# Patient Record
Sex: Female | Born: 1965
Health system: Southern US, Community
[De-identification: ages and names within clinical notes are randomized; demographics above are authoritative.]

## PROBLEM LIST (undated history)

## (undated) DIAGNOSIS — O34219 Maternal care for unspecified type scar from previous cesarean delivery: Secondary | ICD-10-CM

## (undated) DIAGNOSIS — K589 Irritable bowel syndrome without diarrhea: Secondary | ICD-10-CM

## (undated) DIAGNOSIS — K5900 Constipation, unspecified: Secondary | ICD-10-CM

## (undated) DIAGNOSIS — M722 Plantar fascial fibromatosis: Secondary | ICD-10-CM

## (undated) DIAGNOSIS — N841 Polyp of cervix uteri: Secondary | ICD-10-CM

## (undated) DIAGNOSIS — J309 Allergic rhinitis, unspecified: Secondary | ICD-10-CM

## (undated) DIAGNOSIS — Z9889 Other specified postprocedural states: Secondary | ICD-10-CM

## (undated) DIAGNOSIS — R112 Nausea with vomiting, unspecified: Secondary | ICD-10-CM

## (undated) DIAGNOSIS — F39 Unspecified mood [affective] disorder: Secondary | ICD-10-CM

## (undated) DIAGNOSIS — R1084 Generalized abdominal pain: Secondary | ICD-10-CM

## (undated) DIAGNOSIS — J45909 Unspecified asthma, uncomplicated: Secondary | ICD-10-CM

## (undated) DIAGNOSIS — J189 Pneumonia, unspecified organism: Secondary | ICD-10-CM

## (undated) DIAGNOSIS — E669 Obesity, unspecified: Secondary | ICD-10-CM

## (undated) DIAGNOSIS — E279 Disorder of adrenal gland, unspecified: Secondary | ICD-10-CM

## (undated) DIAGNOSIS — K219 Gastro-esophageal reflux disease without esophagitis: Secondary | ICD-10-CM

## (undated) DIAGNOSIS — N289 Disorder of kidney and ureter, unspecified: Secondary | ICD-10-CM

## (undated) DIAGNOSIS — E278 Other specified disorders of adrenal gland: Secondary | ICD-10-CM

## (undated) DIAGNOSIS — Z9289 Personal history of other medical treatment: Secondary | ICD-10-CM

## (undated) HISTORY — DX: Disorder of kidney and ureter, unspecified: N28.9

## (undated) HISTORY — PX: ESOPHAGOGASTRODUODENOSCOPY: SHX1529

## (undated) HISTORY — DX: Generalized abdominal pain: R10.84

## (undated) HISTORY — DX: Obesity, unspecified: E66.9

## (undated) HISTORY — DX: Plantar fascial fibromatosis: M72.2

## (undated) HISTORY — PX: DIAGNOSTIC LAPAROSCOPY: SUR761

## (undated) HISTORY — PX: OTHER SURGICAL HISTORY: SHX169

## (undated) HISTORY — DX: Personal history of other medical treatment: Z92.89

## (undated) HISTORY — DX: Disorder of adrenal gland, unspecified: E27.9

## (undated) HISTORY — DX: Unspecified asthma, uncomplicated: J45.909

## (undated) HISTORY — DX: Other specified disorders of adrenal gland: E27.8

## (undated) HISTORY — DX: Polyp of cervix uteri: N84.1

## (undated) HISTORY — DX: Maternal care for unspecified type scar from previous cesarean delivery: O34.219

## (undated) HISTORY — PX: ABDOMINAL HYSTERECTOMY: SHX81

## (undated) HISTORY — DX: Irritable bowel syndrome, unspecified: K58.9

## (undated) HISTORY — DX: Unspecified mood (affective) disorder: F39

## (undated) HISTORY — DX: Allergic rhinitis, unspecified: J30.9

## (undated) HISTORY — DX: Gastro-esophageal reflux disease without esophagitis: K21.9

## (undated) HISTORY — DX: Constipation, unspecified: K59.00

---

## 1991-02-10 DIAGNOSIS — O34219 Maternal care for unspecified type scar from previous cesarean delivery: Secondary | ICD-10-CM

## 1991-02-10 HISTORY — DX: Maternal care for unspecified type scar from previous cesarean delivery: O34.219

## 2003-02-10 HISTORY — PX: ANTERIOR CERVICAL DECOMP/DISCECTOMY FUSION: SHX1161

## 2003-12-24 ENCOUNTER — Ambulatory Visit (HOSPITAL_COMMUNITY): Admission: RE | Admit: 2003-12-24 | Discharge: 2003-12-24 | Payer: Self-pay | Admitting: Neurosurgery

## 2004-02-13 ENCOUNTER — Encounter: Admission: RE | Admit: 2004-02-13 | Discharge: 2004-02-13 | Payer: Self-pay | Admitting: Neurosurgery

## 2006-04-28 ENCOUNTER — Ambulatory Visit: Payer: Self-pay | Admitting: Unknown Physician Specialty

## 2007-05-10 ENCOUNTER — Ambulatory Visit: Payer: Self-pay | Admitting: Unknown Physician Specialty

## 2007-06-09 DIAGNOSIS — K589 Irritable bowel syndrome without diarrhea: Secondary | ICD-10-CM | POA: Insufficient documentation

## 2007-06-09 DIAGNOSIS — D509 Iron deficiency anemia, unspecified: Secondary | ICD-10-CM | POA: Insufficient documentation

## 2007-06-09 DIAGNOSIS — K219 Gastro-esophageal reflux disease without esophagitis: Secondary | ICD-10-CM | POA: Insufficient documentation

## 2007-06-24 ENCOUNTER — Ambulatory Visit: Payer: Self-pay | Admitting: Unknown Physician Specialty

## 2007-08-03 ENCOUNTER — Ambulatory Visit: Payer: Self-pay | Admitting: Family Medicine

## 2007-11-21 DIAGNOSIS — D131 Benign neoplasm of stomach: Secondary | ICD-10-CM | POA: Insufficient documentation

## 2007-11-21 DIAGNOSIS — Z86010 Personal history of colonic polyps: Secondary | ICD-10-CM | POA: Insufficient documentation

## 2008-04-10 ENCOUNTER — Ambulatory Visit: Payer: Self-pay | Admitting: Family Medicine

## 2008-05-23 ENCOUNTER — Ambulatory Visit: Payer: Self-pay | Admitting: Family Medicine

## 2008-09-17 DIAGNOSIS — E559 Vitamin D deficiency, unspecified: Secondary | ICD-10-CM | POA: Insufficient documentation

## 2008-12-06 HISTORY — PX: LEEP: SHX91

## 2009-06-05 ENCOUNTER — Ambulatory Visit: Payer: Self-pay | Admitting: Family Medicine

## 2010-06-10 ENCOUNTER — Ambulatory Visit: Payer: Self-pay | Admitting: Family Medicine

## 2010-06-12 ENCOUNTER — Ambulatory Visit: Payer: Self-pay | Admitting: Family Medicine

## 2011-06-22 ENCOUNTER — Ambulatory Visit: Payer: Self-pay | Admitting: Family Medicine

## 2012-06-22 ENCOUNTER — Ambulatory Visit: Payer: Self-pay | Admitting: Family Medicine

## 2012-09-29 DIAGNOSIS — M722 Plantar fascial fibromatosis: Secondary | ICD-10-CM

## 2012-09-29 HISTORY — DX: Plantar fascial fibromatosis: M72.2

## 2012-11-01 ENCOUNTER — Encounter: Payer: Self-pay | Admitting: *Deleted

## 2012-11-01 DIAGNOSIS — M722 Plantar fascial fibromatosis: Secondary | ICD-10-CM | POA: Insufficient documentation

## 2012-11-03 ENCOUNTER — Encounter: Payer: Self-pay | Admitting: *Deleted

## 2012-11-10 ENCOUNTER — Ambulatory Visit: Payer: Self-pay | Admitting: Podiatry

## 2013-07-13 ENCOUNTER — Ambulatory Visit: Payer: Self-pay | Admitting: Family Medicine

## 2014-03-12 DIAGNOSIS — R140 Abdominal distension (gaseous): Secondary | ICD-10-CM | POA: Insufficient documentation

## 2014-03-14 ENCOUNTER — Ambulatory Visit: Payer: Self-pay | Admitting: Unknown Physician Specialty

## 2014-04-17 LAB — HM COLONOSCOPY

## 2014-07-30 ENCOUNTER — Other Ambulatory Visit: Payer: Self-pay | Admitting: Family Medicine

## 2014-07-30 DIAGNOSIS — N281 Cyst of kidney, acquired: Secondary | ICD-10-CM

## 2014-08-28 ENCOUNTER — Other Ambulatory Visit: Payer: Self-pay | Admitting: Internal Medicine

## 2014-08-28 DIAGNOSIS — D3501 Benign neoplasm of right adrenal gland: Secondary | ICD-10-CM

## 2014-08-29 ENCOUNTER — Ambulatory Visit: Payer: Self-pay

## 2014-08-30 ENCOUNTER — Other Ambulatory Visit: Payer: Self-pay | Admitting: Family Medicine

## 2014-08-30 DIAGNOSIS — J309 Allergic rhinitis, unspecified: Secondary | ICD-10-CM | POA: Insufficient documentation

## 2014-09-11 ENCOUNTER — Ambulatory Visit
Admission: RE | Admit: 2014-09-11 | Discharge: 2014-09-11 | Disposition: A | Discharge status: Home / Self Care | Payer: 59 | Source: Ambulatory Visit | Attending: Internal Medicine | Admitting: Internal Medicine

## 2014-09-11 DIAGNOSIS — D3501 Benign neoplasm of right adrenal gland: Secondary | ICD-10-CM | POA: Diagnosis present

## 2014-09-14 ENCOUNTER — Telehealth: Payer: Self-pay | Admitting: Urology

## 2014-09-14 NOTE — Telephone Encounter (Signed)
Patient does not need the RUS in August.  I do recommend a RUS in 6 months.

## 2014-09-20 ENCOUNTER — Other Ambulatory Visit: Payer: Self-pay | Admitting: *Deleted

## 2014-09-20 ENCOUNTER — Encounter: Payer: Self-pay | Admitting: *Deleted

## 2014-09-21 ENCOUNTER — Ambulatory Visit
Admission: RE | Admit: 2014-09-21 | Discharge: 2014-09-21 | Disposition: A | Discharge status: Home / Self Care | Payer: 59 | Source: Ambulatory Visit | Attending: Urology | Admitting: Urology

## 2014-09-21 DIAGNOSIS — N281 Cyst of kidney, acquired: Secondary | ICD-10-CM | POA: Insufficient documentation

## 2014-09-24 ENCOUNTER — Telehealth: Payer: Self-pay

## 2014-09-24 NOTE — Telephone Encounter (Signed)
LMOM

## 2014-09-24 NOTE — Telephone Encounter (Signed)
-----   Message from Hollice Espy, MD sent at 09/24/2014  2:28 PM EDT ----- Please let patient know ultrasound looks fine.  Plan for follow up as scheduled with La[facility].  Hollice Espy, MD

## 2014-09-25 NOTE — Telephone Encounter (Signed)
-----   Message from Hollice Espy, MD sent at 09/24/2014  2:28 PM EDT ----- Please let patient know ultrasound looks fine.  Plan for follow up as scheduled with La[facility].  Hollice Espy, MD

## 2014-09-25 NOTE — Telephone Encounter (Signed)
Spoke with pt in reference to u/s and needing to keep appt with [facility]. Pt voiced understanding.

## 2014-09-28 ENCOUNTER — Ambulatory Visit (INDEPENDENT_AMBULATORY_CARE_PROVIDER_SITE_OTHER): Payer: 59 | Admitting: Urology

## 2014-09-28 ENCOUNTER — Encounter: Payer: Self-pay | Admitting: Urology

## 2014-09-28 VITALS — BP 110/71 | HR 66 | Ht 66.0 in | Wt 152.8 lb

## 2014-09-28 DIAGNOSIS — Q6100 Congenital renal cyst, unspecified: Secondary | ICD-10-CM

## 2014-09-28 DIAGNOSIS — N281 Cyst of kidney, acquired: Secondary | ICD-10-CM

## 2014-09-28 LAB — URINALYSIS, COMPLETE
Bilirubin, UA: NEGATIVE
Glucose, UA: NEGATIVE
Ketones, UA: NEGATIVE
Leukocytes, UA: NEGATIVE
Nitrite, UA: NEGATIVE
Protein, UA: NEGATIVE
Specific Gravity, UA: 1.015 (ref 1.005–1.030)
Urobilinogen, Ur: 0.2 mg/dL (ref 0.2–1.0)
pH, UA: 6 (ref 5.0–7.5)

## 2014-09-28 LAB — MICROSCOPIC EXAMINATION
Bacteria, UA: NONE SEEN
Epithelial Cells (non renal): NONE SEEN /hpf (ref 0–10)
RBC, UA: NONE SEEN /hpf (ref 0–?)
WBC, UA: NONE SEEN /hpf (ref 0–?)

## 2014-09-28 NOTE — Progress Notes (Signed)
09/28/2014 9:57 AM   Janice Donovan 1966-01-22 [id number]  Referring provider: No referring provider defined for this encounter.  Chief Complaint  Patient presents with  . renal cyst    6 month follow up patient did have [place]    HPI: Patient is a 48 year old white female who presents today to discuss the results of her renal ultrasound.  Patient was found to have a 1.4cm low-attenuation lesion in the interpolar right kidney (likely a cyst) and right adrenal nodule measuring 0.7 x 1.4cm. Kidneys, ureters and bladder otherwise unremarkable found on a CT scan performed on 03/14/2014.    Her adrenal nodule is being managed through endocrinology.    Patient denies any urinary symptoms. No gross hematuria. No c/o hypertension, palpitations, diaphoresis, headaches or unexplained weight loss.  Never been a smoker.   [place]: father-bladder cancer  I have reviewed the ultrasound with the patient and it is found that she has a 1.8 cm simple cyst involving the mid right kidney.  Her UA is unremarkable at today's exam.  PMH: Past Medical History  Diagnosis Date  . Plantar fasciitis of left foot 09/29/2012  . IBS (irritable bowel syndrome)   . Asthma   . Allergic rhinitis   . GERD (gastroesophageal reflux disease)   . Obesity   . Generalized abdominal pain   . Adrenal nodule   . Renal lesion     Surgical History: Past Surgical History  Procedure Laterality Date  . Colonoscopy with polypectomy    . Cesarean section  1990  . Anterior cervical decomp/discectomy fusion  2005    C4-6    Home Medications:    Medication List       This list is accurate as of: 09/28/14  9:57 AM.  Always use your most recent med list.               amoxicillin-clavulanate 875-125 MG per tablet  Commonly known as:  AUGMENTIN  TAKE 1 TABLET BY MOUTH TWICE A DAY UNTIL FINISHED     azelastine 0.1 % nasal spray  Commonly known as:  ASTELIN  Place into the nose.     calcium carbonate 1250  (500 CA) MG tablet  Commonly known as:  OS-CAL - dosed in mg of elemental calcium  Take by mouth.     fexofenadine 180 MG tablet  Commonly known as:  ALLEGRA  Take by mouth.     fluconazole 100 MG tablet  Commonly known as:  DIFLUCAN  TAKE 2 TABLETS BY MOUTH ON DAY 1, THEN TAKE 1 TAB DAY 2, THEN 1 TAB ON DAY 3     fluticasone 50 MCG/ACT nasal spray  Commonly known as:  FLONASE  Use 2 sprays in each  nostril daily     HYDROcodone-homatropine 5-1.5 MG/5ML syrup  Commonly known as:  HYCODAN  Take by mouth.     ibuprofen 800 MG tablet  Commonly known as:  ADVIL,MOTRIN  Take 800 mg by mouth 3 (three) times daily with meals. # 90 3 refills     montelukast 10 MG tablet  Commonly known as:  SINGULAIR  Take by mouth.     MULTI-VITAMINS Tabs  Take by mouth.     [place] 1/35 (28) tablet  Generic drug:  norethindrone-ethinyl estradiol 1/35     pantoprazole 40 MG tablet  Commonly known as:  PROTONIX  Take by mouth.     PROAIR HFA 108 (90 BASE) MCG/ACT inhaler  Generic drug:  albuterol  Inhale 2  puffs into the lungs every 4 (four) hours as needed.     senna-docusate 8.6-50 MG per tablet  Commonly known as:  Senokot-S  Take by mouth.     VITAMIN D-1000 MAX ST 1000 UNITS tablet  Generic drug:  Cholecalciferol  Take by mouth.     Vitamin-B Complex Tabs  Take by mouth.        Allergies:  Allergies  Allergen Reactions  . Bee Venom   . Erythromycin     Family History: Family History  Problem Relation Age of Onset  . Cancer Father     Bladder  . Diabetes Father   . Hypertension Father   . Prostate cancer Neg Hx   . Kidney disease Neg Hx   . Benign prostatic hyperplasia Father   . COPD Mother   . Heart disease Mother     Social History:  reports that she has never smoked. She does not have any smokeless tobacco history on file. She reports that she does not drink alcohol or use illicit drugs.  ROS: UROLOGY Frequent Urination?: No Hard to postpone  urination?: No Burning/pain with urination?: No Get up at night to urinate?: No Leakage of urine?: No Urine stream starts and stops?: No Trouble starting stream?: No Do you have to strain to urinate?: No Blood in urine?: No Urinary tract infection?: No Sexually transmitted disease?: No Injury to kidneys or bladder?: No Painful intercourse?: No Weak stream?: No Currently pregnant?: No Vaginal bleeding?: No Last menstrual period?: n  Gastrointestinal Nausea?: No Vomiting?: No Indigestion/heartburn?: No Diarrhea?: No Constipation?: No  Constitutional Fever: No Night sweats?: No Weight loss?: No Fatigue?: No  Skin Skin rash/lesions?: No Itching?: No  Eyes Blurred vision?: No Double vision?: No  Ears/Nose/Throat Sore throat?: No Sinus problems?: No  Hematologic/Lymphatic Swollen glands?: No Easy bruising?: No  Cardiovascular Leg swelling?: No Chest pain?: No  Respiratory Cough?: No Shortness of breath?: No  Endocrine Excessive thirst?: No  Musculoskeletal Back pain?: No Joint pain?: No  Neurological Headaches?: No Dizziness?: No  Psychologic Depression?: No Anxiety?: No  Physical Exam: BP 110/71 mmHg  Pulse 66  Ht 5\' 6"  (1.676 m)  Wt 152 lb 12.8 oz (69.31 kg)  BMI 24.67 kg/m2  LMP 09/26/2014  Constitutional:  Alert and oriented, No acute distress. HEENT: [place] AT, moist mucus membranes.  Trachea midline, no masses. Cardiovascular: No clubbing, cyanosis, or edema. Respiratory: Normal respiratory effort, no increased work of breathing. GI: Abdomen is soft, nontender, nondistended, no abdominal masses GU: No CVA tenderness.  Skin: No rashes, bruises or suspicious lesions. Lymph: No cervical or inguinal adenopathy. Neurologic: Grossly intact, no focal deficits, moving all 4 extremities. Psychiatric: Normal mood and affect.  Laboratory Data:  Urinalysis Results for orders placed or performed in visit on 09/28/14  Microscopic Examination    Result Value Ref Range   WBC, UA None seen 0 -  5 /hpf   RBC, UA None seen 0 -  2 /hpf   Epithelial Cells (non renal) None seen 0 - 10 /hpf   Mucus, UA Present (A) Not Estab.   Bacteria, UA None seen None seen/Few  Urinalysis, Complete  Result Value Ref Range   Specific Gravity, UA 1.015 1.005 - 1.030   pH, UA 6.0 5.0 - 7.5   Color, UA Yellow Yellow   Appearance Ur Clear Clear   Leukocytes, UA Negative Negative   Protein, UA Negative Negative/Trace   Glucose, UA Negative Negative   Ketones, UA Negative Negative  RBC, UA Trace (A) Negative   Bilirubin, UA Negative Negative   Urobilinogen, Ur 0.2 0.2 - 1.0 mg/dL   Nitrite, UA Negative Negative   Microscopic Examination See below:     Pertinent Imaging: CLINICAL DATA: Followup presumed right renal cyst identified on prior imaging.  EXAM: RENAL / URINARY TRACT ULTRASOUND COMPLETE  COMPARISON: No prior ultrasound. CT abdomen and pelvis 09/11/2014, 03/14/2014.  FINDINGS: Right Kidney:  Length: Approximately 11.1 cm. Anechoic mass with acoustic enhancement and no internal color Doppler flow involving the mid kidney measuring approximately 1.8 x 1.3 x 1.3 cm. No solid renal mass. Normal parenchymal echotexture. No hydronephrosis. No shadowing calculi.  Left Kidney:  Length: Approximately 11.9 cm. No hydronephrosis. Well-preserved cortex. No shadowing calculi. Normal parenchymal echotexture. No focal parenchymal abnormality.  Bladder:  Decompressed and normal in appearance.  IMPRESSION: 1. Benign approximate 1.8 cm simple cyst involving the mid right kidney. 2. Otherwise normal examination.   Electronically Signed  By: Evangeline Dakin M.D.  On: 09/21/2014 16:48  Assessment & Plan:    1. Renal cyst:   Patient was found to have a benign simple cyst of the mid right kidney.  No further workup is needed at this time.  She will return to the office if she develops gross hematuria, right-sided flank  pain or a decrease in renal function.  - Urinalysis, Complete   No Follow-up on file.  Zara Council, [facility] [address] [place], [place] [address] [phone]

## 2014-10-14 ENCOUNTER — Other Ambulatory Visit: Payer: Self-pay | Admitting: Family Medicine

## 2014-10-14 DIAGNOSIS — Z3041 Encounter for surveillance of contraceptive pills: Secondary | ICD-10-CM

## 2014-10-15 DIAGNOSIS — N281 Cyst of kidney, acquired: Secondary | ICD-10-CM | POA: Insufficient documentation

## 2014-10-16 ENCOUNTER — Encounter: Payer: Self-pay | Admitting: Family Medicine

## 2014-10-16 ENCOUNTER — Ambulatory Visit (INDEPENDENT_AMBULATORY_CARE_PROVIDER_SITE_OTHER): Payer: 59 | Admitting: Family Medicine

## 2014-10-16 VITALS — BP 118/78 | HR 63 | Temp 98.3°F | Resp 14 | Ht 66.0 in | Wt 154.0 lb

## 2014-10-16 DIAGNOSIS — W57XXXA Bitten or stung by nonvenomous insect and other nonvenomous arthropods, initial encounter: Secondary | ICD-10-CM | POA: Diagnosis not present

## 2014-10-16 DIAGNOSIS — Z309 Encounter for contraceptive management, unspecified: Secondary | ICD-10-CM

## 2014-10-16 DIAGNOSIS — T148 Other injury of unspecified body region: Secondary | ICD-10-CM | POA: Diagnosis not present

## 2014-10-16 HISTORY — DX: Encounter for contraceptive management, unspecified: Z30.9

## 2014-10-16 MED ORDER — FLUOCINONIDE 0.05 % EX CREA: 1.0000 "application " | TOPICAL_CREAM | Freq: Three times a day (TID) | CUTANEOUS | Status: DC

## 2014-10-16 NOTE — Progress Notes (Signed)
Subjective:     Patient ID: Janice Donovan, female   DOB: 26-Mar-1965, 49 y.o.   MRN: [id number]  HPI Chief Complaint  Patient presents with  . Rash    Patient comes in office today with concerns of rash, patient states that two weeks ago she was pulling weeds from her garden and developed rash on her arhms which she has described as extremely itchy. Patient noticed on Saturday rash had spread down to her breast line and abdomen, patient has been taking otc hydrocortisone cream.   States the itchy rash is in areas where she was covered.  Review of Systems  Constitutional: Negative for fever and chills.       Objective:   Physical Exam  Constitutional: She appears well-developed and well-nourished. No distress.  Skin:  Clusters of non-specific papules in her inframammary area and at waist line/abdomen  Resolving linear areas of rash on her forearms.     Assessment:    1. Insect bites - fluocinonide cream (LIDEX) 0.05 %; Apply 1 application topically 3 (three) times daily. To poison ivy rash  Dispense: 15 g; Refill: 0    Plan:   call if new symptoms.

## 2014-10-16 NOTE — Telephone Encounter (Signed)
Last OV 06/2014 

## 2014-10-16 NOTE — Patient Instructions (Signed)
Cleanse bites with soap and water.

## 2014-10-18 ENCOUNTER — Other Ambulatory Visit: Payer: Self-pay | Admitting: Family Medicine

## 2014-10-18 DIAGNOSIS — J309 Allergic rhinitis, unspecified: Secondary | ICD-10-CM

## 2014-12-11 ENCOUNTER — Ambulatory Visit (INDEPENDENT_AMBULATORY_CARE_PROVIDER_SITE_OTHER): Payer: 59 | Admitting: Family Medicine

## 2014-12-11 ENCOUNTER — Encounter: Payer: Self-pay | Admitting: Family Medicine

## 2014-12-11 ENCOUNTER — Other Ambulatory Visit: Payer: Self-pay

## 2014-12-11 VITALS — BP 116/72 | HR 72 | Temp 98.9°F | Resp 16 | Ht 66.0 in | Wt 158.0 lb

## 2014-12-11 DIAGNOSIS — J309 Allergic rhinitis, unspecified: Secondary | ICD-10-CM

## 2014-12-11 DIAGNOSIS — J45909 Unspecified asthma, uncomplicated: Secondary | ICD-10-CM | POA: Insufficient documentation

## 2014-12-11 DIAGNOSIS — J0100 Acute maxillary sinusitis, unspecified: Secondary | ICD-10-CM | POA: Diagnosis not present

## 2014-12-11 DIAGNOSIS — J029 Acute pharyngitis, unspecified: Secondary | ICD-10-CM

## 2014-12-11 DIAGNOSIS — J019 Acute sinusitis, unspecified: Secondary | ICD-10-CM | POA: Insufficient documentation

## 2014-12-11 DIAGNOSIS — F4320 Adjustment disorder, unspecified: Secondary | ICD-10-CM | POA: Insufficient documentation

## 2014-12-11 DIAGNOSIS — K317 Polyp of stomach and duodenum: Secondary | ICD-10-CM | POA: Insufficient documentation

## 2014-12-11 DIAGNOSIS — K644 Residual hemorrhoidal skin tags: Secondary | ICD-10-CM | POA: Insufficient documentation

## 2014-12-11 DIAGNOSIS — G5793 Unspecified mononeuropathy of bilateral lower limbs: Secondary | ICD-10-CM | POA: Insufficient documentation

## 2014-12-11 MED ORDER — FLUCONAZOLE 100 MG PO TABS: 100.0000 mg | ORAL_TABLET | Freq: Once | ORAL | Status: DC

## 2014-12-11 MED ORDER — AMOXICILLIN-POT CLAVULANATE 875-125 MG PO TABS: 1.0000 | ORAL_TABLET | Freq: Two times a day (BID) | ORAL | Status: DC

## 2014-12-11 NOTE — Progress Notes (Signed)
Subjective:     Patient ID: Janice Donovan, female   DOB: November 09, 1965, 49 y.o.   MRN: [id number]  Sore Throat  This is a new problem. The current episode started in the past 7 days. The problem has been gradually worsening. The pain is worse on the left side. There has been no fever. The pain is at a severity of 7/10. The pain is moderate. Associated symptoms include congestion, coughing, ear pain (Pressure on the left side), headaches, a hoarse voice, a plugged ear sensation and neck pain (neck feels tender). Pertinent negatives include no ear discharge, shortness of breath, trouble swallowing or vomiting. Treatments tried: her usual  allergy medication.   Sinusitis This is a new problem. The current episode started in the past 7 days. The problem has been gradually worsening since onset. There has been no fever. Her pain is at a severity of 7/10. The pain is severe. Associated symptoms include congestion, coughing, ear pain (Pressure on the left side), headaches, a hoarse voice, neck pain (neck feels tender), sinus pressure and a sore throat. Pertinent negatives include no chills, diaphoresis, shortness of breath or sneezing. Past treatments include oral decongestants and spray decongestants. The treatment provided mild relief.   Next step if gets continued infection is allergy testing.   Patient Active Problem List   Diagnosis Date Noted  . AB (asthmatic bronchitis) 12/11/2014  . Airway hyperreactivity 12/11/2014  . External hemorrhoid 12/11/2014  . Contraception management 10/16/2014  . Renal cyst 10/15/2014  . Allergic rhinitis 08/30/2014  . Plantar fasciitis of left foot   . Avitaminosis D 09/17/2008  . Benign neoplasm of body of stomach 11/21/2007  . History of colon polyps 11/21/2007  . Anemia, iron deficiency 06/09/2007  . Acid reflux 06/09/2007  . IBS (irritable bowel syndrome) 06/09/2007   Ms. Fratus had no medications administered during this visit. Social History   Social  History  . Marital Status: Married    Spouse Name: N/A  . Number of Children: 2  . Years of Education: H/S   Occupational History  . Full-time    Social History Main Topics  . Smoking status: Never Smoker   . Smokeless tobacco: Never Used  . Alcohol Use: No  . Drug Use: No  . Sexual Activity: Not on file   Other Topics Concern  . Not on file   Social History Narrative    Allergies  Allergen Reactions  . Bee Venom   . Erythromycin    Previous Medications   AZELASTINE (ASTELIN) 0.1 % NASAL SPRAY    Place into the nose.   B COMPLEX VITAMINS (VITAMIN-B COMPLEX) TABS    Take by mouth.   CALCIUM CARBONATE (OS-CAL - DOSED IN MG OF ELEMENTAL CALCIUM) 1250 (500 CA) MG TABLET    Take by mouth.   CHOLECALCIFEROL (VITAMIN D-1000 MAX ST) 1000 UNITS TABLET    Take by mouth.   FEXOFENADINE (ALLEGRA) 180 MG TABLET    Take by mouth.   FLUTICASONE (FLONASE) 50 MCG/ACT NASAL SPRAY    Use 2 sprays in each  nostril daily   HYDROCODONE-HOMATROPINE (HYCODAN) 5-1.5 MG/5ML SYRUP    Take by mouth.   IBUPROFEN (ADVIL,MOTRIN) 800 MG TABLET    Take 800 mg by mouth 3 (three) times daily with meals. # 90 3 refills   MONTELUKAST (SINGULAIR) 10 MG TABLET    Take 1 tablet by mouth  every day at bedtime   MULTIPLE VITAMIN (MULTI-VITAMINS) TABS    Take by mouth.  PANTOPRAZOLE (PROTONIX) 40 MG TABLET    Take by mouth.   PIRMELLA 1/35 TABLET    Take 1 tablet by mouth  daily   PROAIR HFA 108 (90 BASE) MCG/ACT INHALER    Inhale 2 puffs into the lungs every 4 (four) hours as needed.   SENNA-DOCUSATE (SENOKOT-S) 8.6-50 MG PER TABLET    Take by mouth.       Review of Systems  Constitutional: Positive for fatigue. Negative for fever, chills, diaphoresis, activity change, appetite change and unexpected weight change.  HENT: Positive for congestion, ear pain (Pressure on the left side), hoarse voice, postnasal drip, rhinorrhea, sinus pressure, sore throat and voice change. Negative for ear discharge, nosebleeds,  sneezing, tinnitus and trouble swallowing.   Respiratory: Positive for cough. Negative for apnea, choking, chest tightness, shortness of breath and wheezing.   Cardiovascular: Negative.   Gastrointestinal: Negative.  Negative for vomiting.  Musculoskeletal: Positive for neck pain (neck feels tender).  Neurological: Positive for headaches.       Objective:   Physical Exam  Constitutional: She is oriented to person, place, and time. She appears well-developed and well-nourished.  HENT:  Head: Normocephalic and atraumatic.  Right Ear: Tympanic membrane and external ear normal.  Left Ear: Tympanic membrane and external ear normal.  Nose: Mucosal edema and rhinorrhea present. Right sinus exhibits maxillary sinus tenderness. Left sinus exhibits maxillary sinus tenderness.  Mouth/Throat: Uvula is midline and oropharynx is clear and moist.  Eyes: Conjunctivae and EOM are normal. Pupils are equal, round, and reactive to light.  Neck: Normal range of motion. Neck supple.  Cardiovascular: Normal rate and regular rhythm.   Pulmonary/Chest: Effort normal and breath sounds normal. She has no wheezes. She has no rales.  Neurological: She is alert and oriented to person, place, and time.    BP 116/72 mmHg  Pulse 72  Temp(Src) 98.9 F (37.2 C) (Oral)  Resp 16  Ht 5\' 6"  (1.676 m)  Wt 158 lb (71.668 kg)  BMI 25.51 kg/m2  LMP 11/20/2014 (Within Days)     Assessment:     See below.     Plan:     1. Sore throat Related to sinus.    2. Acute maxillary sinusitis, recurrence not specified New problem.  Condition is worsening. Will start medication for better control.  Patient instructed to call back if condition worsens or does not improve.   Also gave Diflucan for candidiasis prophylaxis.   - amoxicillin-clavulanate (AUGMENTIN) 875-125 MG tablet; Take 1 tablet by mouth 2 (two) times daily.  Dispense: 20 tablet; Refill: 0 - fluconazole (DIFLUCAN) 100 MG tablet; Take 1 tablet (100 mg total) by  mouth once. And repeat in one week  Dispense: 2 tablet; Refill: 2  3. Allergic rhinitis, unspecified allergic rhinitis type Stable. Will need referral if has continued infections.     Margarita Rana, MD

## 2014-12-31 ENCOUNTER — Encounter: Payer: Self-pay | Admitting: Podiatry

## 2014-12-31 ENCOUNTER — Telehealth: Payer: Self-pay | Admitting: *Deleted

## 2014-12-31 ENCOUNTER — Ambulatory Visit (INDEPENDENT_AMBULATORY_CARE_PROVIDER_SITE_OTHER): Payer: 59 | Admitting: Podiatry

## 2014-12-31 VITALS — BP 130/71 | HR 75 | Resp 18

## 2014-12-31 DIAGNOSIS — L600 Ingrowing nail: Secondary | ICD-10-CM

## 2014-12-31 DIAGNOSIS — B351 Tinea unguium: Secondary | ICD-10-CM

## 2014-12-31 NOTE — Telephone Encounter (Signed)
Left 1st toenail sent to [facility] for definitive diagnosis of fungal element.

## 2014-12-31 NOTE — Patient Instructions (Signed)

## 2014-12-31 NOTE — Progress Notes (Signed)
Subjective:    Patient ID: Janice Donovan, female    DOB: 22-Mar-1965, 49 y.o.   MRN: [id number]  HPI   49 year old female presents the office with concerns of ingrown toenail and pain to the outside borders of the left big toenail. She states the areas painful trigger shoe gear pressure. She has noticed small swelling around the toenail but denies any redness, drainage, purulence , red streaks. She's had no other treatment other than applying Neosporin to the area. No other complaints at this time.   Review of Systems  All other systems reviewed and are negative.      Objective:   Physical Exam General: AAO x3, NAD  Dermatological:  There is evidence of incurvation on the lateral aspect of the left hallux toenail tenderness palpation about this area. There is a localized edema around the lateral nail border without any significant erythema, ascending cellulitis, fluctuance, crepitus, malodor, drainage/purulence. No tenderness or incurvation along the medial nail border. Remaining nails without pathology although toenail polish is in place. There are no open sores, no preulcerative lesions, no rash or signs of infection present.  Vascular: Dorsalis Pedis artery and Posterior Tibial artery pedal pulses are 2/4 bilateral with immedate capillary fill time. Pedal hair growth present. No varicosities and no lower extremity edema present bilateral. There is no pain with calf compression, swelling, warmth, erythema.   Neruologic: Grossly intact via light touch bilateral. Vibratory intact via tuning fork bilateral. Protective threshold with Semmes Wienstein monofilament intact to all pedal sites bilateral. Patellar and Achilles deep tendon reflexes 2+ bilateral. No Babinski or clonus noted bilateral.   Musculoskeletal: No gross boney pedal deformities bilateral. No pain, crepitus, or limitation noted with foot and ankle range of motion bilateral. Muscular strength 5/5 in all groups tested  bilateral.  Gait: Unassisted, Nonantalgic.      Assessment & Plan:  49 -year-old female with left lateral hallux ingrown toenail, symptomatic -Treatment options discussed including all alternatives, risks, and complications -Etiology of symptoms were discussed -At this time, the patient is requesting partial nail removal with chemical matricectomy to the symptomatic portion of the nail. Risks and complications were discussed with the patient for which they understand and  verbally consent to the procedure. Under sterile conditions a total of 3 mL of a mixture of 2% lidocaine plain and 0.5% Marcaine plain was infiltrated in a hallux block fashion. Once anesthetized, the skin was prepped in sterile fashion. A tourniquet was then applied. Next the lateral aspect of hallux nail border was then sharply excised making sure to remove the entire offending nail border. Once the nails were ensured to be removed area was debrided and the underlying skin was intact. There is no purulence identified in the procedure. Next phenol was then applied under standard conditions and copiously irrigated. Silvadene was applied. A dry sterile dressing was applied. After application of the dressing the tourniquet was removed and there is found to be an immediate capillary refill time to the digit. The patient tolerated the procedure well any complications. Post procedure instructions were discussed the patient for which he verbally understood. Follow-up in one week for nail check or sooner if any problems are to arise. Discussed signs/symptoms of infection and directed to call the office immediately should any occur or go directly to the emergency room. In the meantime, encouraged to call the office with any questions, concerns, changes symptoms. - nail sent for culture/biopsy to [facility] labs.  He states his his previously been done.  I will pull her paper chart.  Celesta Gentile, DPM

## 2015-01-08 ENCOUNTER — Encounter: Payer: Self-pay | Admitting: Podiatry

## 2015-01-08 NOTE — Progress Notes (Signed)
Patient ID: Janice Donovan, female   DOB: [birth year], 49 y.o.   MRN: [id number]  Pulled old chart and reviewed previous culture for nail fungus which was negative. There was lichen simplex chronicus of nail unit.

## 2015-01-14 ENCOUNTER — Encounter: Payer: Self-pay | Admitting: Podiatry

## 2015-01-14 ENCOUNTER — Ambulatory Visit (INDEPENDENT_AMBULATORY_CARE_PROVIDER_SITE_OTHER): Payer: 59 | Admitting: Podiatry

## 2015-01-14 DIAGNOSIS — L600 Ingrowing nail: Secondary | ICD-10-CM

## 2015-01-14 DIAGNOSIS — B351 Tinea unguium: Secondary | ICD-10-CM | POA: Diagnosis not present

## 2015-01-14 DIAGNOSIS — Z9889 Other specified postprocedural states: Secondary | ICD-10-CM

## 2015-01-15 NOTE — Progress Notes (Signed)
Patient ID: Janice Donovan, female   DOB: February 22, 1965, 48 y.o.   MRN: [id number]  Subjective: [name] is a 49 y.o.  female returns to office today for follow up evaluation after having left lateral Hallux permanent nail avulsion performed. Patient has been soaking using epsom and applying topical antibiotic covered with bandaid daily. Denies any swelling redness or red streaks. Denies any drainage past. Patient denies fevers, chills, nausea, vomiting. Denies any calf pain, chest pain, SOB.   Objective:  Vitals: Reviewed  General: Well developed, nourished, in no acute distress, alert and oriented x3   Dermatology: Skin is warm, dry and supple bilateral. Left hallux nail border appears to be clean, dry, with mild granular tissue and surrounding scab. There is no surrounding erythema, edema, drainage/purulence. The remaining nails appear unremarkable at this time. There are no other lesions or other signs of infection present.  Neurovascular status: Intact. No lower extremity swelling; No pain with calf compression bilateral.  Musculoskeletal: Decreased tenderness to palpation of the left hallux nail fold. Muscular strength within normal limits bilateral.   Assesement and Plan: S/p partial nail avulsion, doing well.   -Continue soaking in epsom salts twice a day followed by antibiotic ointment and a band-aid. Can leave uncovered at night. Continue this until completely healed.  -If the area has not healed in 2 weeks, call the office for follow-up appointment, or sooner if any problems arise.  -Monitor for any signs/symptoms of infection. Call the office immediately if any occur or go directly to the emergency room. Call with any questions/concerns.  Celesta Gentile, DPM

## 2015-01-16 ENCOUNTER — Telehealth: Payer: Self-pay | Admitting: *Deleted

## 2015-01-16 MED ORDER — NUVAIL EX SOLN: 1.0000 [drp] | Freq: Every day | CUTANEOUS | Status: DC

## 2015-01-16 NOTE — Telephone Encounter (Addendum)
Dr. Jacqualyn Posey reviewed fungal culture of 12/31/2014 as negative, recommended use of NuVail. Left message on mobile that culture results were in and to call, left message on home phone to call to discuss alternative topical medication since cultures were negative. Pt called back and I explained about the results and Nuvail, pt would like to begin Nuvail.

## 2015-02-05 ENCOUNTER — Encounter: Payer: Self-pay | Admitting: Physician Assistant

## 2015-02-05 ENCOUNTER — Ambulatory Visit (INDEPENDENT_AMBULATORY_CARE_PROVIDER_SITE_OTHER): Payer: 59 | Admitting: Physician Assistant

## 2015-02-05 VITALS — BP 112/60 | HR 78 | Temp 98.2°F | Resp 16 | Wt 158.6 lb

## 2015-02-05 DIAGNOSIS — K219 Gastro-esophageal reflux disease without esophagitis: Secondary | ICD-10-CM

## 2015-02-05 DIAGNOSIS — J4520 Mild intermittent asthma, uncomplicated: Secondary | ICD-10-CM | POA: Diagnosis not present

## 2015-02-05 DIAGNOSIS — J309 Allergic rhinitis, unspecified: Secondary | ICD-10-CM | POA: Diagnosis not present

## 2015-02-05 MED ORDER — MONTELUKAST SODIUM 10 MG PO TABS: 10.0000 mg | ORAL_TABLET | Freq: Every day | ORAL | Status: DC

## 2015-02-05 MED ORDER — PROAIR HFA 108 (90 BASE) MCG/ACT IN AERS: 2.0000 | INHALATION_SPRAY | RESPIRATORY_TRACT | Status: DC | PRN

## 2015-02-05 MED ORDER — PANTOPRAZOLE SODIUM 40 MG PO TBEC: 40.0000 mg | DELAYED_RELEASE_TABLET | Freq: Every day | ORAL | Status: DC

## 2015-02-05 MED ORDER — FLUTICASONE PROPIONATE 50 MCG/ACT NA SUSP: 2.0000 | Freq: Every day | NASAL | Status: DC

## 2015-02-05 NOTE — Progress Notes (Signed)
Patient: Janice Donovan Female    DOB: 02-25-1965   49 y.o.   MRN: [id number] Visit Date: 02/05/2015  Today's Provider: Mar Daring, PA-C   Chief Complaint  Patient presents with  . Medication Refill   Subjective:    Gastroesophageal Reflux She reports no abdominal pain, no chest pain, no coughing, no heartburn, no nausea or no wheezing. PAtient is here for medication refill. The problem has been unchanged (Stable). Nothing aggravates the symptoms. She has tried an antacid (Patient is currently on Protonix and is stable) for the symptoms. The treatment provided significant relief.  Asthma She complains of chest tightness (two weeks ago). There is no cough, difficulty breathing, shortness of breath or wheezing. Primary symptoms comments: Patient needs refills.. The current episode started 1 to 4 weeks ago. The problem has been resolved. Pertinent negatives include no appetite change, chest pain, ear congestion, headaches, heartburn, nasal congestion, rhinorrhea or sneezing. Her symptoms are aggravated by nothing. Her past medical history is significant for asthma.       Allergies  Allergen Reactions  . Bee Venom   . Erythromycin    Previous Medications   AZELASTINE (ASTELIN) 0.1 % NASAL SPRAY    Place into the nose.   B COMPLEX VITAMINS (VITAMIN-B COMPLEX) TABS    Take by mouth.   CHOLECALCIFEROL (VITAMIN D-1000 MAX ST) 1000 UNITS TABLET    Take by mouth.   DERMATOLOGICAL PRODUCTS, MISC. (NUVAIL) SOLN    Apply 1 drop topically daily.   FEXOFENADINE (ALLEGRA) 180 MG TABLET    Take by mouth.   FLUTICASONE (FLONASE) 50 MCG/ACT NASAL SPRAY    Use 2 sprays in each  nostril daily   IBUPROFEN (ADVIL,MOTRIN) 800 MG TABLET    Take 800 mg by mouth 3 (three) times daily with meals. # 90 3 refills   MONTELUKAST (SINGULAIR) 10 MG TABLET    Take 1 tablet by mouth  every day at bedtime   MULTIPLE VITAMIN (MULTI-VITAMINS) TABS    Take by mouth.   PANTOPRAZOLE (PROTONIX) 40 MG  TABLET    Take by mouth.   PIRMELLA 1/35 TABLET    Take 1 tablet by mouth  daily   PROAIR HFA 108 (90 BASE) MCG/ACT INHALER    Inhale 2 puffs into the lungs every 4 (four) hours as needed.   SENNA-DOCUSATE (SENOKOT-S) 8.6-50 MG PER TABLET    Take by mouth.   SERTRALINE (ZOLOFT) 50 MG TABLET    TAKE 1/2 TABLET DAILY FOR 7 DAYS, THEN 1 DAILY    Review of Systems  Constitutional: Negative for appetite change.  HENT: Negative for rhinorrhea and sneezing.   Respiratory: Negative for cough, shortness of breath and wheezing.   Cardiovascular: Negative for chest pain.  Gastrointestinal: Negative for heartburn, nausea and abdominal pain.  Neurological: Negative for headaches.  All other systems reviewed and are negative.   Social History  Substance Use Topics  . Smoking status: Never Smoker   . Smokeless tobacco: Never Used  . Alcohol Use: No   Objective:   BP 112/60 mmHg  Pulse 78  Temp(Src) 98.2 F (36.8 C) (Oral)  Resp 16  Wt 158 lb 9.6 oz (71.94 kg)  SpO2 98%  LMP 01/07/2015  Physical Exam  Constitutional: She appears well-developed and well-nourished. No distress.  Neck: Normal range of motion. Neck supple. No tracheal deviation present. No thyromegaly present.  Cardiovascular: Normal rate, regular rhythm and normal heart sounds.  Exam reveals no gallop  and no friction rub.   No murmur heard. Pulmonary/Chest: Effort normal and breath sounds normal. No respiratory distress. She has no wheezes. She has no rales.  Lymphadenopathy:    She has no cervical adenopathy.  Skin: She is not diaphoretic.  Vitals reviewed.       Assessment & Plan:     1. Allergic rhinitis, unspecified allergic rhinitis type Currently stable. Continue current medical treatment plan. Flonase and Singulair were refilled as below. She is to call the office if she has any acute issues, questions or concerns. - fluticasone (FLONASE) 50 MCG/ACT nasal spray; Place 2 sprays into both nostrils daily.   Dispense: 48 g; Refill: 3 - montelukast (SINGULAIR) 10 MG tablet; Take 1 tablet (10 mg total) by mouth at bedtime.  Dispense: 90 tablet; Refill: 3  2. Gastroesophageal reflux disease, esophagitis presence not specified Well-controlled with Protonix. Continue current medical treatment plan. She is to call the office if she has any acute issues, questions or concerns. - pantoprazole (PROTONIX) 40 MG tablet; Take 1 tablet (40 mg total) by mouth daily.  Dispense: 90 tablet; Refill: 3  3. Asthma, mild intermittent, uncomplicated Has been very well controlled with current medical treatment plan including allergy medications. She states that she may use her inhaler one or 2 times every 2-3 months. Albuterol inhaler was refilled as below. She is to call the office if she has any worsening symptoms, acute issues, questions or concerns. - PROAIR HFA 108 (90 BASE) MCG/ACT inhaler; Inhale 2 puffs into the lungs every 4 (four) hours as needed.  Dispense: 18 g; Refill: [place], PA-C  [facility]

## 2015-02-05 NOTE — Patient Instructions (Signed)
Gastroesophageal Reflux Disease, Adult Normally, food travels down the esophagus and stays in the stomach to be digested. However, when a person has gastroesophageal reflux disease (GERD), food and stomach acid move back up into the esophagus. When this happens, the esophagus becomes sore and inflamed. Over time, GERD can create small holes (ulcers) in the lining of the esophagus.  CAUSES This condition is caused by a problem with the muscle between the esophagus and the stomach (lower esophageal sphincter, or LES). Normally, the LES muscle closes after food passes through the esophagus to the stomach. When the LES is weakened or abnormal, it does not close properly, and that allows food and stomach acid to go back up into the esophagus. The LES can be weakened by certain dietary substances, medicines, and medical conditions, including:  Tobacco use.  Pregnancy.  Having a hiatal hernia.  Heavy alcohol use.  Certain foods and beverages, such as coffee, chocolate, onions, and peppermint. RISK FACTORS This condition is more likely to develop in:  People who have an increased body weight.  People who have connective tissue disorders.  People who use NSAID medicines. SYMPTOMS Symptoms of this condition include:  Heartburn.  Difficult or painful swallowing.  The feeling of having a lump in the throat.  Abitter taste in the mouth.  Bad breath.  Having a large amount of saliva.  Having an upset or bloated stomach.  Belching.  Chest pain.  Shortness of breath or wheezing.  Ongoing (chronic) cough or a night-time cough.  Wearing away of tooth enamel.  Weight loss. Different conditions can cause chest pain. Make sure to see your health care provider if you experience chest pain. DIAGNOSIS Your health care provider will take a medical history and perform a physical exam. To determine if you have mild or severe GERD, your health care provider may also monitor how you respond  to treatment. You may also have other tests, including:  An endoscopy toexamine your stomach and esophagus with a small camera.  A test thatmeasures the acidity level in your esophagus.  A test thatmeasures how much pressure is on your esophagus.  A barium swallow or modified barium swallow to show the shape, size, and functioning of your esophagus. TREATMENT The goal of treatment is to help relieve your symptoms and to prevent complications. Treatment for this condition may vary depending on how severe your symptoms are. Your health care provider may recommend:  Changes to your diet.  Medicine.  Surgery. HOME CARE INSTRUCTIONS Diet  Follow a diet as recommended by your health care provider. This may involve avoiding foods and drinks such as:  Coffee and tea (with or without caffeine).  Drinks that containalcohol.  Energy drinks and sports drinks.  Carbonated drinks or sodas.  Chocolate and cocoa.  Peppermint and mint flavorings.  Garlic and onions.  Horseradish.  Spicy and acidic foods, including peppers, chili powder, curry powder, vinegar, hot sauces, and barbecue sauce.  Citrus fruit juices and citrus fruits, such as oranges, lemons, and limes.  Tomato-based foods, such as red sauce, chili, salsa, and pizza with red sauce.  Fried and fatty foods, such as donuts, french fries, potato chips, and high-fat dressings.  High-fat meats, such as hot dogs and fatty cuts of red and white meats, such as rib eye steak, sausage, ham, and bacon.  High-fat dairy items, such as whole milk, butter, and cream cheese.  Eat small, frequent meals instead of large meals.  Avoid drinking large amounts of liquid with your   meals.  Avoid eating meals during the 2-3 hours before bedtime.  Avoid lying down right after you eat.  Do not exercise right after you eat. General Instructions  Pay attention to any changes in your symptoms.  Take over-the-counter and prescription  medicines only as told by your health care provider. Do not take aspirin, ibuprofen, or other NSAIDs unless your health care provider told you to do so.  Do not use any tobacco products, including cigarettes, chewing tobacco, and e-cigarettes. If you need help quitting, ask your health care provider.  Wear loose-fitting clothing. Do not wear anything tight around your waist that causes pressure on your abdomen.  Raise (elevate) the head of your bed 6 inches (15cm).  Try to reduce your stress, such as with yoga or meditation. If you need help reducing stress, ask your health care provider.  If you are overweight, reduce your weight to an amount that is healthy for you. Ask your health care provider for guidance about a safe weight loss goal.  Keep all follow-up visits as told by your health care provider. This is important. SEEK MEDICAL CARE IF:  You have new symptoms.  You have unexplained weight loss.  You have difficulty swallowing, or it hurts to swallow.  You have wheezing or a persistent cough.  Your symptoms do not improve with treatment.  You have a hoarse voice. SEEK IMMEDIATE MEDICAL CARE IF:  You have pain in your arms, neck, jaw, teeth, or back.  You feel sweaty, dizzy, or light-headed.  You have chest pain or shortness of breath.  You vomit and your vomit looks like blood or coffee grounds.  You faint.  Your stool is bloody or black.  You cannot swallow, drink, or eat.   This information is not intended to replace advice given to you by your health care provider. Make sure you discuss any questions you have with your health care provider.   Document Released: 11/05/2004 Document Revised: 10/17/2014 Document Reviewed: 05/23/2014 Elsevier Interactive Patient Education 2016 [place]. Asthma, Adult Asthma is a recurring condition in which the airways tighten and narrow. Asthma can make it difficult to breathe. It can cause coughing, wheezing, and shortness  of breath. Asthma episodes, also called asthma attacks, range from minor to life-threatening. Asthma cannot be cured, but medicines and lifestyle changes can help control it. CAUSES Asthma is believed to be caused by inherited (genetic) and environmental factors, but its exact cause is unknown. Asthma may be triggered by allergens, lung infections, or irritants in the air. Asthma triggers are different for each person. Common triggers include:   Animal dander.  Dust mites.  Cockroaches.  Pollen from trees or grass.  Mold.  Smoke.  Air pollutants such as dust, household cleaners, hair sprays, aerosol sprays, paint fumes, strong chemicals, or strong odors.  Cold air, weather changes, and winds (which increase molds and pollens in the air).  Strong emotional expressions such as crying or laughing hard.  Stress.  Certain medicines (such as aspirin) or types of drugs (such as beta-blockers).  Sulfites in foods and drinks. Foods and drinks that may contain sulfites include dried fruit, potato chips, and sparkling grape juice.  Infections or inflammatory conditions such as the flu, a cold, or an inflammation of the nasal membranes (rhinitis).  Gastroesophageal reflux disease (GERD).  Exercise or strenuous activity. SYMPTOMS Symptoms may occur immediately after asthma is triggered or many hours later. Symptoms include:  Wheezing.  Excessive nighttime or early morning coughing.  Frequent  or severe coughing with a common cold.  Chest tightness.  Shortness of breath. DIAGNOSIS  The diagnosis of asthma is made by a review of your medical history and a physical exam. Tests may also be performed. These may include:  Lung function studies. These tests show how much air you breathe in and out.  Allergy tests.  Imaging tests such as X-rays. TREATMENT  Asthma cannot be cured, but it can usually be controlled. Treatment involves identifying and avoiding your asthma triggers. It  also involves medicines. There are 2 classes of medicine used for asthma treatment:   Controller medicines. These prevent asthma symptoms from occurring. They are usually taken every day.  Reliever or rescue medicines. These quickly relieve asthma symptoms. They are used as needed and provide short-term relief. Your health care provider will help you create an asthma action plan. An asthma action plan is a written plan for managing and treating your asthma attacks. It includes a list of your asthma triggers and how they may be avoided. It also includes information on when medicines should be taken and when their dosage should be changed. An action plan may also involve the use of a device called a peak flow meter. A peak flow meter measures how well the lungs are working. It helps you monitor your condition. HOME CARE INSTRUCTIONS   Take medicines only as directed by your health care provider. Speak with your health care provider if you have questions about how or when to take the medicines.  Use a peak flow meter as directed by your health care provider. Record and keep track of readings.  Understand and use the action plan to help minimize or stop an asthma attack without needing to seek medical care.  Control your home environment in the following ways to help prevent asthma attacks:  Do not smoke. Avoid being exposed to secondhand smoke.  Change your heating and air conditioning filter regularly.  Limit your use of fireplaces and wood stoves.  Get rid of pests (such as roaches and mice) and their droppings.  Throw away plants if you see mold on them.  Clean your floors and dust regularly. Use unscented cleaning products.  Try to have someone else vacuum for you regularly. Stay out of rooms while they are being vacuumed and for a short while afterward. If you vacuum, use a dust mask from a hardware store, a double-layered or microfilter vacuum cleaner bag, or a vacuum cleaner with a  HEPA filter.  Replace carpet with wood, tile, or vinyl flooring. Carpet can trap dander and dust.  Use allergy-proof pillows, mattress covers, and box spring covers.  Wash bed sheets and blankets every week in hot water and dry them in a dryer.  Use blankets that are made of polyester or cotton.  Clean bathrooms and kitchens with bleach. If possible, have someone repaint the walls in these rooms with mold-resistant paint. Keep out of the rooms that are being cleaned and painted.  Wash hands frequently. SEEK MEDICAL CARE IF:   You have wheezing, shortness of breath, or a cough even if taking medicine to prevent attacks.  The colored mucus you cough up (sputum) is thicker than usual.  Your sputum changes from clear or white to yellow, green, gray, or bloody.  You have any problems that may be related to the medicines you are taking (such as a rash, itching, swelling, or trouble breathing).  You are using a reliever medicine more than 2-3 times per week.  Your peak flow is still at 50-79% of your personal best after following your action plan for 1 hour.  You have a fever. SEEK IMMEDIATE MEDICAL CARE IF:   You seem to be getting worse and are unresponsive to treatment during an asthma attack.  You are short of breath even at rest.  You get short of breath when doing very little physical activity.  You have difficulty eating, drinking, or talking due to asthma symptoms.  You develop chest pain.  You develop a fast heartbeat.  You have a bluish color to your lips or fingernails.  You are light-headed, dizzy, or faint.  Your peak flow is less than 50% of your personal best.   This information is not intended to replace advice given to you by your health care provider. Make sure you discuss any questions you have with your health care provider.   Document Released: 01/26/2005 Document Revised: 10/17/2014 Document Reviewed: 08/25/2012 Elsevier Interactive Patient Education  [employer].

## 2015-02-08 ENCOUNTER — Encounter: Payer: Self-pay | Admitting: Family Medicine

## 2015-03-10 LAB — HM PAP SMEAR: HM Pap smear: NEGATIVE

## 2015-05-15 ENCOUNTER — Encounter: Payer: Self-pay | Admitting: Physician Assistant

## 2015-05-15 ENCOUNTER — Ambulatory Visit (INDEPENDENT_AMBULATORY_CARE_PROVIDER_SITE_OTHER): Payer: 59 | Admitting: Physician Assistant

## 2015-05-15 VITALS — BP 130/80 | HR 66 | Temp 98.2°F | Resp 16 | Wt 162.4 lb

## 2015-05-15 DIAGNOSIS — N951 Menopausal and female climacteric states: Secondary | ICD-10-CM

## 2015-05-15 DIAGNOSIS — J4541 Moderate persistent asthma with (acute) exacerbation: Secondary | ICD-10-CM | POA: Diagnosis not present

## 2015-05-15 MED ORDER — ESCITALOPRAM OXALATE 10 MG PO TABS: 5.0000 mg | ORAL_TABLET | Freq: Every day | ORAL | Status: DC

## 2015-05-15 MED ORDER — FLUTICASONE PROPIONATE HFA 44 MCG/ACT IN AERO: 2.0000 | INHALATION_SPRAY | Freq: Two times a day (BID) | RESPIRATORY_TRACT | Status: DC

## 2015-05-15 NOTE — Patient Instructions (Signed)

## 2015-05-15 NOTE — Progress Notes (Signed)
Patient: Janice Donovan Female    DOB: 1965/02/25   50 y.o.   MRN: [id number] Visit Date: 05/15/2015  Today's Provider: Mar Daring, PA-C   Chief Complaint  Patient presents with  . Asthma   Subjective:    HPI  Asthma Exacerbation: She has previously been evaluated here for asthma and now presents with an asthma exacerbation. This exacerbation began 3 weeks ago.  Associated symptoms include productive cough and productive cough with sputum described as yellow and brownShe reports no SOB,Wheezing. Only Chest tightness.Symptoms have been unchanged since their onset.The previous exacerbation occurred depends it varies in time. She has treated this current exacerbation with the ProAir inhaler.She takes allergy medication daily and the Singulair.      Allergies  Allergen Reactions  . Bee Venom   . Erythromycin    Previous Medications   AZELASTINE (ASTELIN) 0.1 % NASAL SPRAY    Place into the nose.   B COMPLEX VITAMINS (VITAMIN-B COMPLEX) TABS    Take by mouth.   CHOLECALCIFEROL (VITAMIN D-1000 MAX ST) 1000 UNITS TABLET    Take by mouth.   DERMATOLOGICAL PRODUCTS, MISC. (NUVAIL) SOLN    Apply 1 drop topically daily.   FEXOFENADINE (ALLEGRA) 180 MG TABLET    Take by mouth.   FLUTICASONE (FLONASE) 50 MCG/ACT NASAL SPRAY    Place 2 sprays into both nostrils daily.   IBUPROFEN (ADVIL,MOTRIN) 800 MG TABLET    Take 800 mg by mouth 3 (three) times daily with meals. # 90 3 refills   MONTELUKAST (SINGULAIR) 10 MG TABLET    Take 1 tablet (10 mg total) by mouth at bedtime.   MULTIPLE VITAMIN (MULTI-VITAMINS) TABS    Take by mouth.   PANTOPRAZOLE (PROTONIX) 40 MG TABLET    Take 1 tablet (40 mg total) by mouth daily.   PIRMELLA 1/35 TABLET    Take 1 tablet by mouth  daily   PROAIR HFA 108 (90 BASE) MCG/ACT INHALER    Inhale 2 puffs into the lungs every 4 (four) hours as needed.   SENNA-DOCUSATE (SENOKOT-S) 8.6-50 MG PER TABLET    Take by mouth.   SERTRALINE (ZOLOFT) 50 MG TABLET     TAKE 1/2 TABLET DAILY FOR 7 DAYS, THEN 1 DAILY    Review of Systems  Constitutional: Negative for fever and fatigue.  HENT: Positive for congestion, postnasal drip, rhinorrhea and sneezing. Negative for sore throat.   Respiratory: Positive for cough and chest tightness. Negative for shortness of breath and wheezing.   Cardiovascular: Negative for chest pain, palpitations and leg swelling.  Gastrointestinal: Negative.   Neurological: Negative.     Social History  Substance Use Topics  . Smoking status: Never Smoker   . Smokeless tobacco: Never Used  . Alcohol Use: No   Objective:   BP 130/80 mmHg  Pulse 66  Temp(Src) 98.2 F (36.8 C) (Oral)  Resp 16  Wt 162 lb 6.4 oz (73.664 kg)  SpO2 100%  LMP 05/07/2015  Physical Exam  Constitutional: She appears well-developed and well-nourished. No distress.  HENT:  Head: Normocephalic and atraumatic.  Right Ear: Hearing, tympanic membrane, external ear and ear canal normal.  Left Ear: Hearing, tympanic membrane, external ear and ear canal normal.  Nose: Nose normal.  Mouth/Throat: Uvula is midline, oropharynx is clear and moist and mucous membranes are normal. No oropharyngeal exudate, posterior oropharyngeal edema or posterior oropharyngeal erythema.  Eyes: Conjunctivae are normal. Pupils are equal, round, and reactive to light.  Right eye exhibits no discharge. Left eye exhibits no discharge. No scleral icterus.  Neck: Normal range of motion. Neck supple. No tracheal deviation present. No thyromegaly present.  Cardiovascular: Normal rate, regular rhythm and normal heart sounds.  Exam reveals no gallop and no friction rub.   No murmur heard. Pulmonary/Chest: Effort normal. No stridor. No respiratory distress. She has wheezes (mild expiratory wheeze throughout). She has no rales.  Lymphadenopathy:    She has no cervical adenopathy.  Skin: Skin is warm and dry. She is not diaphoretic.  Vitals reviewed.       Assessment & Plan:       1. Airway hyperreactivity, moderate persistent, with acute exacerbation Will add flovent HFA for her to take as directed below daily for the next 4-6 weeks.  We will re-evaluate at that time to see if she can step down back to just her pro-air following allergy season. Her seasonal allergies are exacerbating her asthma. She is to call if symptoms persist or worsen. - fluticasone (FLOVENT HFA) 44 MCG/ACT inhaler; Inhale 2 puffs into the lungs 2 (two) times daily.  Dispense: 2 Inhaler; Refill: 0  2. Menopausal symptom She does have menopausal mood swings and has been stable on zoloft 25mg  until just recently. She noticed symptoms increasing so she increased to 50mg  and states she felt better but has had a 13 pound weight gain since going to the 50mg . Will switch to lexapro as below to see if she does not have weight gain but does have symptom control with it. I will see her back in 4 weeks to see how she is doing with the switch. - escitalopram (LEXAPRO) 10 MG tablet; Take 0.5 tablets (5 mg total) by mouth daily. Then may increase to whole tab after 7 days if needed.  Dispense: 30 tablet; Refill: 0       Mar Daring, PA-C  [facility]

## 2015-06-03 ENCOUNTER — Ambulatory Visit (INDEPENDENT_AMBULATORY_CARE_PROVIDER_SITE_OTHER): Payer: 59 | Admitting: Physician Assistant

## 2015-06-03 ENCOUNTER — Encounter: Payer: Self-pay | Admitting: Physician Assistant

## 2015-06-03 VITALS — BP 122/80 | HR 71 | Temp 98.2°F | Resp 16 | Wt 166.4 lb

## 2015-06-03 DIAGNOSIS — D649 Anemia, unspecified: Secondary | ICD-10-CM

## 2015-06-03 DIAGNOSIS — J029 Acute pharyngitis, unspecified: Secondary | ICD-10-CM

## 2015-06-03 LAB — POCT RAPID STREP A (OFFICE): RAPID STREP A SCREEN: NEGATIVE

## 2015-06-03 NOTE — Progress Notes (Signed)
Patient: Janice Donovan Female    DOB: May 11, 1965   50 y.o.   MRN: [id number] Visit Date: 06/03/2015  Today's Provider: Mar Daring, PA-C   Chief Complaint  Patient presents with  . Sore Throat   Subjective:    Sore Throat  This is a new problem. The current episode started in the past 7 days (Started Friday). The problem has been gradually worsening. There has been no fever. The pain is moderate. Associated symptoms include swollen glands and trouble swallowing. Pertinent negatives include no abdominal pain, congestion, coughing, ear discharge, ear pain, headaches, plugged ear sensation, shortness of breath, stridor or vomiting. She has tried gargles and NSAIDs for the symptoms. The treatment provided no relief.   She did recently start using flovent for reactive airway and states it has been helping. She has not used since Friday due to sore throat. She wanted to make sure she had not developed thrush.    Allergies  Allergen Reactions  . Bee Venom   . Erythromycin    Previous Medications   AZELASTINE (ASTELIN) 0.1 % NASAL SPRAY    Place into the nose.   B COMPLEX VITAMINS (VITAMIN-B COMPLEX) TABS    Take by mouth.   CHOLECALCIFEROL (VITAMIN D-1000 MAX ST) 1000 UNITS TABLET    Take by mouth.   DERMATOLOGICAL PRODUCTS, MISC. (NUVAIL) SOLN    Apply 1 drop topically daily.   ESCITALOPRAM (LEXAPRO) 10 MG TABLET    Take 0.5 tablets (5 mg total) by mouth daily. Then may increase to whole tab after 7 days if needed.   FEXOFENADINE (ALLEGRA) 180 MG TABLET    Take by mouth.   FLUTICASONE (FLONASE) 50 MCG/ACT NASAL SPRAY    Place 2 sprays into both nostrils daily.   FLUTICASONE (FLOVENT HFA) 44 MCG/ACT INHALER    Inhale 2 puffs into the lungs 2 (two) times daily.   IBUPROFEN (ADVIL,MOTRIN) 800 MG TABLET    Take 800 mg by mouth 3 (three) times daily with meals. # 90 3 refills   MONTELUKAST (SINGULAIR) 10 MG TABLET    Take 1 tablet (10 mg total) by mouth at bedtime.   MULTIPLE VITAMIN (MULTI-VITAMINS) TABS    Take by mouth.   PANTOPRAZOLE (PROTONIX) 40 MG TABLET    Take 1 tablet (40 mg total) by mouth daily.   PIRMELLA 1/35 TABLET    Take 1 tablet by mouth  daily   PROAIR HFA 108 (90 BASE) MCG/ACT INHALER    Inhale 2 puffs into the lungs every 4 (four) hours as needed.   SENNA-DOCUSATE (SENOKOT-S) 8.6-50 MG PER TABLET    Take by mouth.    Review of Systems  Constitutional: Positive for chills. Negative for fever and fatigue.  HENT: Positive for sore throat and trouble swallowing. Negative for congestion, ear discharge, ear pain, postnasal drip, rhinorrhea and sinus pressure.   Respiratory: Negative for cough, chest tightness, shortness of breath, wheezing and stridor.   Cardiovascular: Negative for chest pain, palpitations and leg swelling.  Gastrointestinal: Negative for nausea, vomiting and abdominal pain.  Neurological: Negative for dizziness and headaches.    Social History  Substance Use Topics  . Smoking status: Never Smoker   . Smokeless tobacco: Never Used  . Alcohol Use: No   Objective:   BP 122/80 mmHg  Pulse 71  Temp(Src) 98.2 F (36.8 C) (Oral)  Resp 16  Wt 166 lb 6.4 oz (75.479 kg)  LMP 05/07/2015  Physical Exam  Constitutional:  She appears well-developed and well-nourished. No distress.  HENT:  Head: Normocephalic and atraumatic.  Right Ear: Hearing, tympanic membrane, external ear and ear canal normal.  Left Ear: Hearing, tympanic membrane, external ear and ear canal normal.  Nose: Nose normal. Right sinus exhibits no maxillary sinus tenderness and no frontal sinus tenderness. Left sinus exhibits no maxillary sinus tenderness and no frontal sinus tenderness.  Mouth/Throat: Uvula is midline and mucous membranes are normal. Posterior oropharyngeal erythema present. No oropharyngeal exudate or posterior oropharyngeal edema.  Eyes: Conjunctivae are normal. Pupils are equal, round, and reactive to light. Right eye exhibits no  discharge. Left eye exhibits no discharge. No scleral icterus.  Neck: Normal range of motion. Neck supple. No tracheal deviation present. No thyromegaly present.  Cardiovascular: Normal rate, regular rhythm and normal heart sounds.  Exam reveals no gallop and no friction rub.   No murmur heard. Pulmonary/Chest: Effort normal and breath sounds normal. No stridor. No respiratory distress. She has no wheezes. She has no rales.  Lymphadenopathy:    She has no cervical adenopathy.  Skin: Skin is warm and dry. She is not diaphoretic.  Vitals reviewed.       Assessment & Plan:     1. Sore throat Strep negative. Most likely viral pharyngitis. Continue symptomatic relief with tylenol or IBU. Continue lozenges and salt water gargles. Call if fever develops or if symptoms last greater than 7-10 days.  - POCT rapid strep A  2. Low hemoglobin Was told she had low hemoglobin when going to donate blood. No labs for comparison. Will check labs as below to work up and follow up pending labs. - CBC w/Diff/Platelet - IBC panel - B12 - Folate       Mar Daring, PA-C  [facility]

## 2015-06-03 NOTE — Patient Instructions (Signed)

## 2015-06-04 LAB — CBC WITH DIFFERENTIAL/PLATELET
BASOS ABS: 0 10*3/uL (ref 0.0–0.2)
Basos: 0 %
EOS (ABSOLUTE): 0.1 10*3/uL (ref 0.0–0.4)
Eos: 1 %
Hematocrit: 35 % (ref 34.0–46.6)
Hemoglobin: 11.5 g/dL (ref 11.1–15.9)
IMMATURE GRANS (ABS): 0 10*3/uL (ref 0.0–0.1)
Immature Granulocytes: 0 %
LYMPHS: 20 %
Lymphocytes Absolute: 1.5 10*3/uL (ref 0.7–3.1)
MCH: 28.5 pg (ref 26.6–33.0)
MCHC: 32.9 g/dL (ref 31.5–35.7)
MCV: 87 fL (ref 79–97)
MONOS ABS: 0.5 10*3/uL (ref 0.1–0.9)
Monocytes: 6 %
NEUTROS ABS: 5.5 10*3/uL (ref 1.4–7.0)
Neutrophils: 73 %
PLATELETS: 247 10*3/uL (ref 150–379)
RBC: 4.04 x10E6/uL (ref 3.77–5.28)
RDW: 14.7 % (ref 12.3–15.4)
WBC: 7.6 10*3/uL (ref 3.4–10.8)

## 2015-06-04 LAB — IRON AND TIBC
IRON SATURATION: 11 % — AB (ref 15–55)
IRON: 37 ug/dL (ref 27–159)
Total Iron Binding Capacity: 352 ug/dL (ref 250–450)
UIBC: 315 ug/dL (ref 131–425)

## 2015-06-04 LAB — VITAMIN B12: VITAMIN B 12: 527 pg/mL (ref 211–946)

## 2015-06-04 LAB — FOLATE: Folate: >20 ng/mL (ref >3.0)

## 2015-06-06 ENCOUNTER — Other Ambulatory Visit: Payer: Self-pay

## 2015-06-06 ENCOUNTER — Telehealth: Payer: Self-pay

## 2015-06-06 ENCOUNTER — Telehealth: Payer: Self-pay | Admitting: Family Medicine

## 2015-06-06 DIAGNOSIS — B379 Candidiasis, unspecified: Secondary | ICD-10-CM

## 2015-06-06 DIAGNOSIS — T3695XA Adverse effect of unspecified systemic antibiotic, initial encounter: Principal | ICD-10-CM

## 2015-06-06 DIAGNOSIS — J029 Acute pharyngitis, unspecified: Secondary | ICD-10-CM

## 2015-06-06 MED ORDER — AMOXICILLIN 875 MG PO TABS
875.0000 mg | ORAL_TABLET | Freq: Two times a day (BID) | ORAL | Status: DC
Start: 2015-06-06 — End: 2015-10-30

## 2015-06-06 MED ORDER — FLUCONAZOLE 150 MG PO TABS
150.0000 mg | ORAL_TABLET | Freq: Once | ORAL | Status: DC
Start: 2015-06-06 — End: 2015-07-04

## 2015-06-06 NOTE — Telephone Encounter (Signed)
Open in error

## 2015-06-06 NOTE — Telephone Encounter (Signed)
Pt states she was seen Monday and is still have sore throat and sinus pressure in her face and ears.  Pt is requesting a Rx to help with this.  [facility].  [phone]

## 2015-06-06 NOTE — Telephone Encounter (Signed)
Patient is requesting Diflucan to be sent to [facility].  Thanks,  -Alekzander Cardell

## 2015-06-06 NOTE — Telephone Encounter (Signed)
Rx sent 

## 2015-06-06 NOTE — Telephone Encounter (Signed)
Amoxil sent to [facility]

## 2015-06-11 ENCOUNTER — Telehealth: Payer: Self-pay | Admitting: Family Medicine

## 2015-06-11 DIAGNOSIS — F329 Major depressive disorder, single episode, unspecified: Secondary | ICD-10-CM

## 2015-06-11 DIAGNOSIS — F32A Depression, unspecified: Secondary | ICD-10-CM

## 2015-06-11 MED ORDER — BUPROPION HCL ER (XL) 150 MG PO TB24: 150.0000 mg | ORAL_TABLET | Freq: Every day | ORAL | Status: DC

## 2015-06-11 NOTE — Telephone Encounter (Signed)
Wellbutrin Rx sent in to [facility]

## 2015-06-11 NOTE — Telephone Encounter (Signed)
Pt states she would like to switch the Rx from escitalopram (LEXAPRO) 10 MG tablet to Wellbutrin.  Pt states this has been discussed with Tawanna Sat.  [facility].  [id number]

## 2015-06-11 NOTE — Telephone Encounter (Signed)
Patient advised.  Thanks,  -Becky Berberian 

## 2015-06-11 NOTE — Telephone Encounter (Signed)
Please advise.  Thanks,  -Joseline 

## 2015-07-04 ENCOUNTER — Telehealth: Payer: Self-pay | Admitting: Physician Assistant

## 2015-07-04 DIAGNOSIS — T3695XA Adverse effect of unspecified systemic antibiotic, initial encounter: Secondary | ICD-10-CM

## 2015-07-04 DIAGNOSIS — F32A Depression, unspecified: Secondary | ICD-10-CM

## 2015-07-04 DIAGNOSIS — B379 Candidiasis, unspecified: Secondary | ICD-10-CM

## 2015-07-04 DIAGNOSIS — F329 Major depressive disorder, single episode, unspecified: Secondary | ICD-10-CM

## 2015-07-04 MED ORDER — BUPROPION HCL ER (XL) 150 MG PO TB24: 150.0000 mg | ORAL_TABLET | Freq: Every day | ORAL | Status: DC

## 2015-07-04 MED ORDER — FLUCONAZOLE 150 MG PO TABS: 150.0000 mg | ORAL_TABLET | Freq: Once | ORAL | Status: DC

## 2015-07-04 NOTE — Telephone Encounter (Signed)
Pt contacted office for refill request on the following medications:  buPROPion (WELLBUTRIN XL) 150 MG 24 hr tablet.  90 day supply.  [facility] mail order.  CB#[phone]/MW   Pt states she has a burning and itching in her vaginal area for 5 days.  She has been using over the counter medication for a yeast infection but it is not helping. Pt is requesting a Rx to help with this. [facility]

## 2015-07-04 NOTE — Telephone Encounter (Signed)
Please review. Thanks-Keian Odriscoll

## 2015-07-04 NOTE — Telephone Encounter (Signed)
Refills made as asked.

## 2015-10-30 ENCOUNTER — Encounter: Payer: Self-pay | Admitting: Physician Assistant

## 2015-10-30 ENCOUNTER — Ambulatory Visit (INDEPENDENT_AMBULATORY_CARE_PROVIDER_SITE_OTHER): Payer: 59 | Admitting: Physician Assistant

## 2015-10-30 VITALS — BP 110/76 | HR 74 | Temp 98.0°F | Resp 16 | Wt 161.0 lb

## 2015-10-30 DIAGNOSIS — Z23 Encounter for immunization: Secondary | ICD-10-CM

## 2015-10-30 DIAGNOSIS — L237 Allergic contact dermatitis due to plants, except food: Secondary | ICD-10-CM | POA: Diagnosis not present

## 2015-10-30 MED ORDER — PREDNISONE 10 MG (21) PO TBPK: ORAL_TABLET | ORAL | 0 refills | Status: DC

## 2015-10-30 NOTE — Patient Instructions (Signed)
Poison [facility] is an inflammation of the skin (contact dermatitis). It is caused by contact with the allergens on the leaves of the oak (toxicodendron) plants. Depending on your sensitivity, the rash may consist simply of redness and itching, or it may also progress to blisters which may break open (rupture). These must be well cared for to prevent secondary germ (bacterial) infection as these infections can lead to scarring. The eyes may also get puffy. The puffiness is worst in the morning and gets better as the day progresses. Healing is best accomplished by keeping any open areas dry, clean, covered with a bandage, and covered with an antibacterial ointment if needed. Without secondary infection, this dermatitis usually heals without scarring within 2 to 3 weeks without treatment. HOME CARE INSTRUCTIONS When you have been exposed to poison oak, it is very important to thoroughly wash with soap and water as soon as the exposure has been discovered. You have about one half hour to remove the plant resin before it will cause the rash. This cleaning will quickly destroy the oil or antigen on the skin (the antigen is what causes the rash). Wash aggressively under the fingernails as any plant resin still there will continue to spread the rash. Do not rub skin vigorously when washing affected area. Poison oak cannot spread if no oil from the plant remains on your body. Rash that has progressed to weeping sores (lesions) will not spread the rash unless you have not washed thoroughly. It is also important to clean any clothes you have been wearing as they may carry active allergens which will spread the rash, even several days later. Avoidance of the plant in the future is the best measure. Poison oak plants can be recognized by the number of leaves. Generally, poison oak has three leaves with flowering branches on a single stem. Diphenhydramine may be purchased over the counter and used as needed for  itching. Do not drive with this medication if it makes you drowsy. Ask your caregiver about medication for children. SEEK IMMEDIATE MEDICAL CARE IF:   Open areas of the rash develop.  You notice redness extending beyond the area of the rash.  There is a pus like discharge.  There is increased pain.  Other signs of infection develop (such as fever).   This information is not intended to replace advice given to you by your health care provider. Make sure you discuss any questions you have with your health care provider.   Document Released: 08/02/2002 Document Revised: 04/20/2011 Document Reviewed: 07/04/2014 Elsevier Interactive Patient Education [employer].

## 2015-10-30 NOTE — Progress Notes (Signed)
Patient: Janice Donovan Female    DOB: January 14, 1966   50 y.o.   MRN: [id number] Visit Date: 10/30/2015  Today's Provider: Mar Daring, PA-C   Chief Complaint  Patient presents with  . Rash   Subjective:    HPI Rash: Patient complains of rash involving the back. Rash started 1 day ago. Appearance of rash at onset: Color of lesion(s): pink. Rash has changed over time Initial distribution: back.  Discomfort associated with rash: is pruritic.  Associated symptoms: none. Denies: fever. Patient has not had previous evaluation of rash. Patient has not had previous treatment. Patient has not had contacts with similar rash. Patient has not identified precipitant. Patient has not had new exposures (soaps, lotions, laundry detergents, foods, medications, plants, insects or animals.)     Allergies  Allergen Reactions  . Bee Venom   . Erythromycin      Current Outpatient Prescriptions:  .  azelastine (ASTELIN) 0.1 % nasal spray, Place into the nose., Disp: , Rfl:  .  B Complex Vitamins (VITAMIN-B COMPLEX) TABS, Take by mouth., Disp: , Rfl:  .  buPROPion (WELLBUTRIN XL) 150 MG 24 hr tablet, Take 1 tablet (150 mg total) by mouth daily., Disp: 90 tablet, Rfl: 3 .  Cholecalciferol (VITAMIN D-1000 MAX ST) 1000 UNITS tablet, Take by mouth., Disp: , Rfl:  .  Dermatological Products, Misc. (NUVAIL) SOLN, Apply 1 drop topically daily., Disp: 1 Bottle, Rfl: 11 .  fexofenadine (ALLEGRA) 180 MG tablet, Take by mouth., Disp: , Rfl:  .  fluticasone (FLONASE) 50 MCG/ACT nasal spray, Place 2 sprays into both nostrils daily., Disp: 48 g, Rfl: 3 .  ibuprofen (ADVIL,MOTRIN) 800 MG tablet, Take 800 mg by mouth 3 (three) times daily with meals. # 90 3 refills, Disp: , Rfl:  .  montelukast (SINGULAIR) 10 MG tablet, Take 1 tablet (10 mg total) by mouth at bedtime., Disp: 90 tablet, Rfl: 3 .  Multiple Vitamin (MULTI-VITAMINS) TABS, Take by mouth., Disp: , Rfl:  .  pantoprazole (PROTONIX) 40 MG tablet,  Take 1 tablet (40 mg total) by mouth daily., Disp: 90 tablet, Rfl: 3 .  PIRMELLA 1/35 tablet, Take 1 tablet by mouth  daily, Disp: 84 tablet, Rfl: 3 .  PROAIR HFA 108 (90 BASE) MCG/ACT inhaler, Inhale 2 puffs into the lungs every 4 (four) hours as needed., Disp: 18 g, Rfl: 5 .  senna-docusate (SENOKOT-S) 8.6-50 MG per tablet, Take by mouth., Disp: , Rfl:   Review of Systems  Constitutional: Negative.   Respiratory: Negative.   Cardiovascular: Negative.   Skin: Positive for rash.  Neurological: Negative.     Social History  Substance Use Topics  . Smoking status: Never Smoker  . Smokeless tobacco: Never Used  . Alcohol use No   Objective:   BP 110/76 (BP Location: Right Arm, Patient Position: Sitting, Cuff Size: Large)   Pulse 74   Temp 98 F (36.7 C) (Oral)   Resp 16   Wt 161 lb (73 kg)   SpO2 100%   BMI 25.99 kg/m   Physical Exam  Constitutional: She appears well-developed and well-nourished. No distress.  Cardiovascular: Normal rate, regular rhythm and normal heart sounds.  Exam reveals no gallop and no friction rub.   No murmur heard. Pulmonary/Chest: Effort normal and breath sounds normal. No respiratory distress. She has no wheezes. She has no rales.  Skin: Rash (linear distribution of vesicular lesions along lower back) noted. Rash is vesicular. She is not diaphoretic.  Vitals reviewed.     Assessment & Plan:     1. Contact dermatitis due to poison oak Worsening and not responding well to topical hydrocortisone. Will prescribe prednisone as below. She is to call if rash does not respond or improve.  - predniSONE (STERAPRED UNI-PAK 21 TAB) 10 MG (21) TBPK tablet; Take as directed on package instructions  Dispense: 21 tablet; Refill: 0  2. Need for influenza vaccination Flu vaccine given today without complication. Patient sat upright for 15 minutes to check for adverse reaction before being released. - Flu Vaccine QUAD 36+ mos PF IM (Fluarix & Fluzone Quad PF)        Mar Daring, PA-C  [facility]

## 2015-11-13 ENCOUNTER — Other Ambulatory Visit: Payer: Self-pay | Admitting: Certified Nurse Midwife

## 2015-11-13 DIAGNOSIS — Z1231 Encounter for screening mammogram for malignant neoplasm of breast: Secondary | ICD-10-CM

## 2016-01-06 ENCOUNTER — Ambulatory Visit
Admission: RE | Admit: 2016-01-06 | Discharge: 2016-01-06 | Disposition: A | Discharge status: Home / Self Care | Payer: 59 | Source: Ambulatory Visit | Attending: Certified Nurse Midwife | Admitting: Certified Nurse Midwife

## 2016-01-06 DIAGNOSIS — Z1231 Encounter for screening mammogram for malignant neoplasm of breast: Secondary | ICD-10-CM | POA: Diagnosis not present

## 2016-01-06 LAB — HM MAMMOGRAPHY

## 2016-01-09 ENCOUNTER — Other Ambulatory Visit: Payer: Self-pay | Admitting: Physician Assistant

## 2016-01-09 DIAGNOSIS — K219 Gastro-esophageal reflux disease without esophagitis: Secondary | ICD-10-CM

## 2016-03-06 ENCOUNTER — Other Ambulatory Visit: Payer: Self-pay | Admitting: Physician Assistant

## 2016-03-06 DIAGNOSIS — J309 Allergic rhinitis, unspecified: Secondary | ICD-10-CM

## 2016-03-06 NOTE — Telephone Encounter (Signed)
Please review-aa 

## 2016-03-27 ENCOUNTER — Encounter: Payer: Self-pay | Admitting: Physician Assistant

## 2016-03-27 ENCOUNTER — Ambulatory Visit (INDEPENDENT_AMBULATORY_CARE_PROVIDER_SITE_OTHER): Payer: 59 | Admitting: Physician Assistant

## 2016-03-27 VITALS — BP 106/76 | HR 72 | Temp 98.2°F | Resp 16 | Wt 152.0 lb

## 2016-03-27 DIAGNOSIS — F325 Major depressive disorder, single episode, in full remission: Secondary | ICD-10-CM | POA: Diagnosis not present

## 2016-03-27 DIAGNOSIS — J3089 Other allergic rhinitis: Secondary | ICD-10-CM | POA: Diagnosis not present

## 2016-03-27 DIAGNOSIS — K219 Gastro-esophageal reflux disease without esophagitis: Secondary | ICD-10-CM

## 2016-03-27 MED ORDER — PANTOPRAZOLE SODIUM 40 MG PO TBEC: 40.0000 mg | DELAYED_RELEASE_TABLET | Freq: Two times a day (BID) | ORAL | 1 refills | Status: DC

## 2016-03-27 MED ORDER — MONTELUKAST SODIUM 10 MG PO TABS: 10.0000 mg | ORAL_TABLET | Freq: Every day | ORAL | 3 refills | Status: DC

## 2016-03-27 MED ORDER — BUPROPION HCL ER (XL) 150 MG PO TB24: 150.0000 mg | ORAL_TABLET | Freq: Every day | ORAL | 3 refills | Status: DC

## 2016-03-27 NOTE — Progress Notes (Signed)
Patient: Janice Donovan Female    DOB: September 17, 1965   51 y.o.   MRN: [id number] Visit Date: 03/30/2016  Today's Provider: Mar Daring, PA-C   Chief Complaint  Patient presents with  . Follow-up   Subjective:    HPI  Follow up for asthma  The patient was last seen for this 10 months ago. Changes made at last visit include no changes.  She reports excellent compliance with treatment. Patient reports she is using montelukast 10 mg qd, and has not had to use her in haler recently.  She feels that condition is Improved. She is not having side effects.   ------------------------------------------------------------------------------------   Follow up for GERD  The patient was last seen for this 1 years ago. Changes made at last visit include no changes.  She reports excellent compliance with treatment. Patient reports she is taking pantoprazole 40 mg qd. She feels that condition is uncontrolled. She is not having side effects.   ------------------------------------------------------------------------------------   Depression, Follow-up  She  was last seen for this 10 months ago. Changes made at last visit include start wellbutrin.   She reports excellent compliance with treatment. She is not having side effects.   She reports excellent tolerance of treatment. Current symptoms include: patient denies any symptoms She feels she is Improved since last visit.  ------------------------------------------------------------------------ She also wanted to inform us about updating her family history because her mother had a stroke on 02/19/16 secondary to a PFO. She has some residual effects, mostly speech and some right UE weakness.      Allergies  Allergen Reactions  . Bee Venom   . Erythromycin      Current Outpatient Prescriptions:  .  buPROPion (WELLBUTRIN XL) 150 MG 24 hr tablet, Take 1 tablet (150 mg total) by mouth daily., Disp: 90 tablet, Rfl:  3 .  Cholecalciferol (VITAMIN D-1000 MAX ST) 1000 UNITS tablet, Take by mouth., Disp: , Rfl:  .  fexofenadine (ALLEGRA) 180 MG tablet, Take by mouth., Disp: , Rfl:  .  fluticasone (FLONASE) 50 MCG/ACT nasal spray, Place 2 sprays into both nostrils daily., Disp: 48 g, Rfl: 3 .  ibuprofen (ADVIL,MOTRIN) 800 MG tablet, Take 800 mg by mouth 3 (three) times daily with meals. # 90 3 refills, Disp: , Rfl:  .  montelukast (SINGULAIR) 10 MG tablet, Take 1 tablet (10 mg total) by mouth at bedtime., Disp: 90 tablet, Rfl: 3 .  Multiple Vitamin (MULTI-VITAMINS) TABS, Take by mouth., Disp: , Rfl:  .  Norethin Ace-Eth Estrad-FE (BLISOVI 24 FE PO), Take 1 tablet by mouth daily., Disp: , Rfl:  .  pantoprazole (PROTONIX) 40 MG tablet, Take 1 tablet (40 mg total) by mouth 2 (two) times daily before a meal., Disp: 180 tablet, Rfl: 1 .  PROAIR HFA 108 (90 BASE) MCG/ACT inhaler, Inhale 2 puffs into the lungs every 4 (four) hours as needed., Disp: 18 g, Rfl: 5 .  senna-docusate (SENOKOT-S) 8.6-50 MG per tablet, Take by mouth., Disp: , Rfl:   Review of Systems  Constitutional: Negative.   HENT: Negative.   Respiratory: Negative.   Cardiovascular: Negative.   Allergic/Immunologic: Positive for environmental allergies.  Psychiatric/Behavioral: Negative.     Social History  Substance Use Topics  . Smoking status: Never Smoker  . Smokeless tobacco: Never Used  . Alcohol use No   Objective:   BP 106/76 (BP Location: Left Arm, Patient Position: Sitting, Cuff Size: Large)   Pulse 72   Temp  98.2 F (36.8 C) (Oral)   Resp 16   Wt 152 lb (68.9 kg)   LMP 03/06/2016 (Exact Date)   SpO2 99%   BMI 24.53 kg/m   Physical Exam  Constitutional: She appears well-developed and well-nourished. No distress.  Neck: Normal range of motion. Neck supple. No JVD present. No tracheal deviation present. No thyromegaly present.  Cardiovascular: Normal rate, regular rhythm and normal heart sounds.  Exam reveals no gallop and no  friction rub.   No murmur heard. Pulmonary/Chest: Effort normal and breath sounds normal. No respiratory distress. She has no wheezes. She has no rales.  Abdominal: Soft. Bowel sounds are normal. She exhibits no distension and no mass. There is tenderness in the epigastric area. There is no rebound and no guarding.  Lymphadenopathy:    She has no cervical adenopathy.  Skin: She is not diaphoretic.  Psychiatric: She has a normal mood and affect. Her behavior is normal. Judgment and thought content normal.  Vitals reviewed.       Assessment & Plan:     1. Non-seasonal allergic rhinitis due to other allergic trigger, unspecified chronicity Stable. Diagnosis pulled for medication refill. Continue current medical treatment plan. - montelukast (SINGULAIR) 10 MG tablet; Take 1 tablet (10 mg total) by mouth at bedtime.  Dispense: 90 tablet; Refill: 3  2. Gastroesophageal reflux disease, esophagitis presence not specified Uncontrolled and now having recurrent symptoms.  Will increase protonix to BID dosing for short while to see if any improvement. - pantoprazole (PROTONIX) 40 MG tablet; Take 1 tablet (40 mg total) by mouth 2 (two) times daily before a meal.  Dispense: 180 tablet; Refill: 1  3. Depression, major, single episode, complete remission (HCC) Stable. Diagnosis pulled for medication refill. Continue current medical treatment plan. - buPROPion (WELLBUTRIN XL) 150 MG 24 hr tablet; Take 1 tablet (150 mg total) by mouth daily.  Dispense: 90 tablet; Refill: [place], PA-C  [facility]

## 2016-03-30 ENCOUNTER — Encounter: Payer: Self-pay | Admitting: Physician Assistant

## 2016-04-08 ENCOUNTER — Telehealth: Payer: Self-pay | Admitting: Physician Assistant

## 2016-04-08 MED ORDER — FLUCONAZOLE 150 MG PO TABS: 150.0000 mg | ORAL_TABLET | Freq: Once | ORAL | 0 refills | Status: AC

## 2016-04-08 NOTE — Telephone Encounter (Signed)
Please review-aa 

## 2016-04-08 NOTE — Telephone Encounter (Signed)
Pt would like a rx of diflucan.  She has a yeast infection and has been using the otc cream but its not helping.  [phone]  She uses [facility]  Thanks Con Memos

## 2016-04-08 NOTE — Telephone Encounter (Signed)
Pt advised-aa 

## 2016-04-08 NOTE — Telephone Encounter (Signed)
Diflucan sent to [facility]

## 2016-06-11 ENCOUNTER — Encounter: Payer: Self-pay | Admitting: Obstetrics and Gynecology

## 2016-06-11 ENCOUNTER — Ambulatory Visit (INDEPENDENT_AMBULATORY_CARE_PROVIDER_SITE_OTHER): Payer: 59 | Admitting: Obstetrics and Gynecology

## 2016-06-11 VITALS — BP 100/60 | Ht 66.0 in | Wt 157.0 lb

## 2016-06-11 DIAGNOSIS — B379 Candidiasis, unspecified: Secondary | ICD-10-CM | POA: Diagnosis not present

## 2016-06-11 DIAGNOSIS — N760 Acute vaginitis: Secondary | ICD-10-CM

## 2016-06-11 LAB — POCT WET PREP WITH KOH
Clue Cells Wet Prep HPF POC: NEGATIVE
KOH Prep POC: NEGATIVE
Trichomonas, UA: NEGATIVE
Yeast Wet Prep HPF POC: NEGATIVE

## 2016-06-11 MED ORDER — CLOTRIMAZOLE-BETAMETHASONE 1-0.05 % EX CREA: 1.0000 "application " | TOPICAL_CREAM | Freq: Two times a day (BID) | CUTANEOUS | 0 refills | Status: DC

## 2016-06-11 NOTE — Progress Notes (Signed)
Chief Complaint  Patient presents with  . Vaginitis    HPI:      [name] is a 51 y.o. G2P2 who LMP was Patient's last menstrual period was 06/09/2016., presents today for recurrent yeast vag sx. She gets sx when she gets a period, although they are irregular since she is perimenopausal. She complains of itching/burning/increased d/c. She treats with diflucan with relief, but sx recur. Sx 3 times since 11/17. This time, sx started last wk. She treated with diflucan and is finally feeling better now. She is leaving for an anniversary trip tomorrow.    Patient Active Problem List   Diagnosis Date Noted  . AB (asthmatic bronchitis) 12/11/2014  . Airway hyperreactivity 12/11/2014  . External hemorrhoid 12/11/2014  . Sinusitis, acute 12/11/2014  . Contraception management 10/16/2014  . Renal cyst 10/15/2014  . Allergic rhinitis 08/30/2014  . Plantar fasciitis of left foot   . Avitaminosis D 09/17/2008  . Benign neoplasm of body of stomach 11/21/2007  . History of colon polyps 11/21/2007  . Anemia, iron deficiency 06/09/2007  . Acid reflux 06/09/2007  . IBS (irritable bowel syndrome) 06/09/2007    Family History  Problem Relation Age of Onset  . Cancer Father     Bladder  . Diabetes Father   . Hypertension Father   . Benign prostatic hyperplasia Father   . Bladder Cancer Father   . Hyperlipidemia Father   . Dementia Father   . COPD Mother   . Heart disease Mother   . Stroke Mother   . Diabetes Sister   . Hyperlipidemia Brother   . Hypertension Brother   . Prostate cancer Neg Hx   . Kidney disease Neg Hx     Social History   Social History  . Marital status: Married    Spouse name: N/A  . Number of children: 2  . Years of education: H/S   Occupational History  . Full-time    Social History Main Topics  . Smoking status: Never Smoker  . Smokeless tobacco: Never Used  . Alcohol use No  . Drug use: No  . Sexual activity: Yes    Birth control/  protection: Pill   Other Topics Concern  . Not on file   Social History Narrative  . No narrative on file     Current Outpatient Prescriptions:  .  BLISOVI 24 FE 1-20 MG-MCG(24) tablet, , Disp: , Rfl:  .  buPROPion (WELLBUTRIN XL) 150 MG 24 hr tablet, Take 1 tablet (150 mg total) by mouth daily., Disp: 90 tablet, Rfl: 3 .  Cholecalciferol (VITAMIN D-1000 MAX ST) 1000 UNITS tablet, Take by mouth., Disp: , Rfl:  .  clotrimazole-betamethasone (LOTRISONE) cream, Apply 1 application topically 2 (two) times daily. Apply externally BID prn sx up to 2 wks, Disp: 15 g, Rfl: 0 .  fexofenadine (ALLEGRA) 180 MG tablet, Take by mouth., Disp: , Rfl:  .  fluticasone (FLONASE) 50 MCG/ACT nasal spray, Place 2 sprays into both nostrils daily., Disp: 48 g, Rfl: 3 .  ibuprofen (ADVIL,MOTRIN) 800 MG tablet, Take 800 mg by mouth 3 (three) times daily with meals. # 90 3 refills, Disp: , Rfl:  .  montelukast (SINGULAIR) 10 MG tablet, Take 1 tablet (10 mg total) by mouth at bedtime., Disp: 90 tablet, Rfl: 3 .  Multiple Vitamin (MULTI-VITAMINS) TABS, Take by mouth., Disp: , Rfl:  .  Norethin Ace-Eth Estrad-FE (BLISOVI 24 FE PO), Take 1 tablet by mouth daily., Disp: , Rfl:  .  pantoprazole (PROTONIX) 40 MG tablet, Take 1 tablet (40 mg total) by mouth 2 (two) times daily before a meal., Disp: 180 tablet, Rfl: 1 .  PROAIR HFA 108 (90 BASE) MCG/ACT inhaler, Inhale 2 puffs into the lungs every 4 (four) hours as needed., Disp: 18 g, Rfl: 5 .  senna-docusate (SENOKOT-S) 8.6-50 MG per tablet, Take by mouth., Disp: , Rfl:   Review of Systems  Constitutional: Negative for fever.  Gastrointestinal: Negative for blood in stool, constipation, diarrhea, nausea and vomiting.  Genitourinary: Positive for vaginal discharge and vaginal pain. Negative for dyspareunia, dysuria, flank pain, frequency, hematuria, urgency and vaginal bleeding.  Musculoskeletal: Negative for back pain.  Skin: Negative for rash.     OBJECTIVE:    Vitals:  BP 100/60   Ht 5\' 6"  (1.676 m)   Wt 157 lb (71.2 kg)   LMP 06/09/2016   BMI 25.34 kg/m   Physical Exam  Constitutional: She is oriented to person, place, and time and well-developed, well-nourished, and in no distress. Vital signs are normal.  Genitourinary: Uterus normal, cervix normal, right adnexa normal, left adnexa normal and vulva normal. Uterus is not enlarged. Cervix exhibits no motion tenderness and no tenderness. Right adnexum displays no mass and no tenderness. Left adnexum displays no mass and no tenderness. Vulva exhibits no erythema, no exudate, no lesion, no rash and no tenderness. Vagina exhibits no lesion. White and vaginal discharge found.  Neurological: She is oriented to person, place, and time.  Vitals reviewed.   Results: Results for orders placed or performed in visit on 06/11/16 (from the past 24 hour(s))  POCT Wet Prep with KOH     Status: Normal   Collection Time: 06/11/16  5:07 PM  Result Value Ref Range   Trichomonas, UA Negative    Clue Cells Wet Prep HPF POC neg    Epithelial Wet Prep HPF POC  Few, Moderate, Many, Too numerous to count   Yeast Wet Prep HPF POC neg    Bacteria Wet Prep HPF POC  Few   RBC Wet Prep HPF POC     WBC Wet Prep HPF POC     KOH Prep POC Negative Negative       Assessment/Plan: Acute vaginitis - Neg wet prep/exam. Given sx recurrence, check One Swab yeast culture. Will f/u wtih results. Rx clotrimazole/betamethasone crm in meantime. F/u prn. - Plan: clotrimazole-betamethasone (LOTRISONE) cream, Other/Misc lab test, POCT Wet Prep with KOH    Return if symptoms worsen or fail to improve.  Alicia B. Copland, PA-C 06/11/2016 5:08 PM

## 2016-06-13 ENCOUNTER — Encounter: Payer: Self-pay | Admitting: Obstetrics and Gynecology

## 2016-06-17 ENCOUNTER — Telehealth: Payer: Self-pay | Admitting: Obstetrics and Gynecology

## 2016-06-17 NOTE — Telephone Encounter (Signed)
Pt aware of neg yeast on One Swab. Pt treats recurrent yeast with diflucan with sx relief, No other strains yeast persist. Sx most likely caused by irreg menses/hormones. Dove sens skin soap, line dry underwear. F/u prn.

## 2016-08-26 ENCOUNTER — Ambulatory Visit (INDEPENDENT_AMBULATORY_CARE_PROVIDER_SITE_OTHER): Payer: 59 | Admitting: Obstetrics and Gynecology

## 2016-08-26 ENCOUNTER — Encounter: Payer: Self-pay | Admitting: Obstetrics and Gynecology

## 2016-08-26 VITALS — BP 118/74 | Ht 66.0 in | Wt 160.0 lb

## 2016-08-26 DIAGNOSIS — R140 Abdominal distension (gaseous): Secondary | ICD-10-CM

## 2016-08-26 DIAGNOSIS — N938 Other specified abnormal uterine and vaginal bleeding: Secondary | ICD-10-CM

## 2016-08-26 DIAGNOSIS — R635 Abnormal weight gain: Secondary | ICD-10-CM | POA: Diagnosis not present

## 2016-08-26 NOTE — Progress Notes (Signed)
Chief Complaint  Patient presents with  . Menopause    HPI:      [name] is a 50 y.o. [id number] who LMP was No LMP recorded., presents today for "menopausal sx". Pt complains of feeling cold during the day and waking up at night very hot, not necessarily sweating. She has also had breast tenderness, bloating that varies throughout the cycle, weight gain (up 3# since 5/18), and midcycle spotting with subsequent cramping during placebo pills of OCPs. Sx have been going on for the past several months. She is on OCPs, no late/missed pills. She has not had any recent labs. She denies any pelvic pain, fevers.  Her yeast vag sx from 5/18 resolved with tx. Last annual 11/17.   Patient Active Problem List   Diagnosis Date Noted  . AB (asthmatic bronchitis) 12/11/2014  . Airway hyperreactivity 12/11/2014  . External hemorrhoid 12/11/2014  . Sinusitis, acute 12/11/2014  . Contraception management 10/16/2014  . Renal cyst 10/15/2014  . Allergic rhinitis 08/30/2014  . Plantar fasciitis of left foot   . Avitaminosis D 09/17/2008  . Benign neoplasm of body of stomach 11/21/2007  . History of colon polyps 11/21/2007  . Anemia, iron deficiency 06/09/2007  . Acid reflux 06/09/2007  . IBS (irritable bowel syndrome) 06/09/2007    Family History  Problem Relation Age of Onset  . Cancer Father        Bladder  . Diabetes Father   . Hypertension Father   . Benign prostatic hyperplasia Father   . Bladder Cancer Father   . Hyperlipidemia Father   . Dementia Father   . Asthma Father   . COPD Mother   . Heart disease Mother   . Stroke Mother   . Diabetes Sister   . Hyperlipidemia Brother   . Hypertension Brother   . Cancer Maternal Grandmother 80       pancreatic  . Prostate cancer Neg Hx   . Kidney disease Neg Hx     Social History   Social History  . Marital status: Married    Spouse name: N/A  . Number of children: 2  . Years of education: H/S   Occupational History   . Full-time [place] History Main Topics  . Smoking status: Never Smoker  . Smokeless tobacco: Never Used  . Alcohol use No  . Drug use: No  . Sexual activity: Yes    Birth control/ protection: Pill   Other Topics Concern  . Not on file   Social History Narrative  . No narrative on file     Current Outpatient Prescriptions:  .  BLISOVI 24 FE 1-20 MG-MCG(24) tablet, , Disp: , Rfl:  .  buPROPion (WELLBUTRIN XL) 150 MG 24 hr tablet, Take 1 tablet (150 mg total) by mouth daily., Disp: 90 tablet, Rfl: 3 .  Cholecalciferol (VITAMIN D-1000 MAX ST) 1000 UNITS tablet, Take by mouth., Disp: , Rfl:  .  clotrimazole-betamethasone (LOTRISONE) cream, Apply 1 application topically 2 (two) times daily. Apply externally BID prn sx up to 2 wks, Disp: 15 g, Rfl: 0 .  fexofenadine (ALLEGRA) 180 MG tablet, Take by mouth., Disp: , Rfl:  .  fluticasone (FLONASE) 50 MCG/ACT nasal spray, Place 2 sprays into both nostrils daily., Disp: 48 g, Rfl: 3 .  ibuprofen (ADVIL,MOTRIN) 800 MG tablet, Take 800 mg by mouth 3 (three) times daily with meals. # 90 3 refills, Disp: , Rfl:  .  montelukast (SINGULAIR) 10 MG tablet,  Take 1 tablet (10 mg total) by mouth at bedtime., Disp: 90 tablet, Rfl: 3 .  Multiple Vitamin (MULTI-VITAMINS) TABS, Take by mouth., Disp: , Rfl:  .  Norethin Ace-Eth Estrad-FE (BLISOVI 24 FE PO), Take 1 tablet by mouth daily., Disp: , Rfl:  .  pantoprazole (PROTONIX) 40 MG tablet, Take 1 tablet (40 mg total) by mouth 2 (two) times daily before a meal., Disp: 180 tablet, Rfl: 1 .  PROAIR HFA 108 (90 BASE) MCG/ACT inhaler, Inhale 2 puffs into the lungs every 4 (four) hours as needed., Disp: 18 g, Rfl: 5 .  senna-docusate (SENOKOT-S) 8.6-50 MG per tablet, Take by mouth., Disp: , Rfl:   Review of Systems  Constitutional: Negative for fever.  Gastrointestinal: Negative for blood in stool, constipation, diarrhea, nausea and vomiting.  Genitourinary: Negative for dyspareunia, dysuria, flank  pain, frequency, hematuria, urgency, vaginal bleeding, vaginal discharge and vaginal pain.  Musculoskeletal: Negative for back pain.  Skin: Negative for rash.     OBJECTIVE:   Vitals:  BP 118/74   Ht 5\' 6"  (1.676 m)   Wt 160 lb (72.6 kg)   BMI 25.82 kg/m   Physical Exam  Constitutional: She is oriented to person, place, and time and well-developed, well-nourished, and in no distress.  Musculoskeletal: Normal range of motion.  Neurological: She is alert and oriented to person, place, and time.  Psychiatric: Mood, memory, affect and judgment normal.  Vitals reviewed.    Assessment/Plan: DUB (dysfunctional uterine bleeding) - For several months, on OCPs. Check labs. If neg, check u/s. If that is neg, will change OCPs.  - Plan: Comprehensive metabolic panel, TSH + free T4, CBC with Differential, Prolactin, Comprehensive metabolic panel, TSH + free T4, CBC with Differential, Prolactin  Weight gain - Plan: Comprehensive metabolic panel, TSH + free T4, CBC with Differential, Prolactin, Comprehensive metabolic panel, TSH + free T4, CBC with Differential, Prolactin  Bloating - Most likely hormonal due to sx coming and going. If labs neg, will check u/s.    Return if symptoms worsen or fail to improve.  Alicia B. Copland, PA-C 08/26/2016 5:03 PM

## 2016-09-02 LAB — CBC WITH DIFFERENTIAL/PLATELET
BASOS ABS: 0 10*3/uL (ref 0.0–0.2)
Basos: 0 %
EOS (ABSOLUTE): 0 10*3/uL (ref 0.0–0.4)
Eos: 1 %
HEMOGLOBIN: 13.2 g/dL (ref 11.1–15.9)
Hematocrit: 40.3 % (ref 34.0–46.6)
Immature Grans (Abs): 0 10*3/uL (ref 0.0–0.1)
Immature Granulocytes: 0 %
LYMPHS ABS: 1.3 10*3/uL (ref 0.7–3.1)
Lymphs: 25 %
MCH: 31.2 pg (ref 26.6–33.0)
MCHC: 32.8 g/dL (ref 31.5–35.7)
MCV: 95 fL (ref 79–97)
MONOS ABS: 0.4 10*3/uL (ref 0.1–0.9)
Monocytes: 7 %
NEUTROS ABS: 3.5 10*3/uL (ref 1.4–7.0)
Neutrophils: 67 %
PLATELETS: 223 10*3/uL (ref 150–379)
RBC: 4.23 x10E6/uL (ref 3.77–5.28)
RDW: 12.7 % (ref 12.3–15.4)
WBC: 5.2 10*3/uL (ref 3.4–10.8)

## 2016-09-02 LAB — COMPREHENSIVE METABOLIC PANEL
ALBUMIN: 4.4 g/dL (ref 3.5–5.5)
ALK PHOS: 65 IU/L (ref 39–117)
ALT: 7 IU/L (ref 0–32)
AST: 14 IU/L (ref 0–40)
Albumin/Globulin Ratio: 1.8 (ref 1.2–2.2)
BILIRUBIN TOTAL: 0.3 mg/dL (ref 0.0–1.2)
BUN / CREAT RATIO: 27 — AB (ref 9–23)
BUN: 19 mg/dL (ref 6–24)
CHLORIDE: 103 mmol/L (ref 96–106)
CO2: 20 mmol/L (ref 20–29)
Calcium: 9 mg/dL (ref 8.7–10.2)
Creatinine, Ser: 0.7 mg/dL (ref 0.57–1.00)
GFR calc Af Amer: 117 mL/min/{1.73_m2} (ref >59)
GFR calc non Af Amer: 101 mL/min/{1.73_m2} (ref >59)
GLUCOSE: 86 mg/dL (ref 65–99)
Globulin, Total: 2.4 g/dL (ref 1.5–4.5)
Potassium: 4.3 mmol/L (ref 3.5–5.2)
Sodium: 138 mmol/L (ref 134–144)
Total Protein: 6.8 g/dL (ref 6.0–8.5)

## 2016-09-02 LAB — TSH+FREE T4
FREE T4: 1.19 ng/dL (ref 0.82–1.77)
TSH: 1.85 u[IU]/mL (ref 0.450–4.500)

## 2016-09-02 LAB — PROLACTIN: Prolactin: 12.2 ng/mL (ref 4.8–23.3)

## 2016-09-03 ENCOUNTER — Telehealth: Payer: Self-pay | Admitting: Obstetrics and Gynecology

## 2016-09-03 DIAGNOSIS — N938 Other specified abnormal uterine and vaginal bleeding: Secondary | ICD-10-CM

## 2016-09-03 NOTE — Telephone Encounter (Signed)
Pt aware of neg labs. Will check u/s for DUB on OCPs. If neg, will change pills.

## 2016-09-04 ENCOUNTER — Ambulatory Visit (INDEPENDENT_AMBULATORY_CARE_PROVIDER_SITE_OTHER): Payer: 59

## 2016-09-04 ENCOUNTER — Other Ambulatory Visit: Payer: Self-pay | Admitting: Obstetrics and Gynecology

## 2016-09-04 DIAGNOSIS — N938 Other specified abnormal uterine and vaginal bleeding: Secondary | ICD-10-CM

## 2016-09-04 MED ORDER — LEVONORGESTREL-ETHINYL ESTRAD 0.1-20 MG-MCG PO TABS: 1.0000 | ORAL_TABLET | Freq: Every day | ORAL | 2 refills | Status: DC

## 2016-09-04 NOTE — Addendum Note (Signed)
Addended by: Ardeth Perfect B on: 3/79/0240 04:32 PM   Modules accepted: Orders

## 2016-09-04 NOTE — Telephone Encounter (Signed)
Pt aware of neg u/s results. Will change OCPs.  Rx aviane to [facility]. F/u via phone with sx. Will then send to mail order.

## 2016-09-07 DIAGNOSIS — L570 Actinic keratosis: Secondary | ICD-10-CM | POA: Diagnosis not present

## 2016-09-07 DIAGNOSIS — Z1283 Encounter for screening for malignant neoplasm of skin: Secondary | ICD-10-CM | POA: Diagnosis not present

## 2016-09-07 DIAGNOSIS — L814 Other melanin hyperpigmentation: Secondary | ICD-10-CM | POA: Diagnosis not present

## 2016-09-12 ENCOUNTER — Other Ambulatory Visit: Payer: Self-pay | Admitting: Physician Assistant

## 2016-09-12 DIAGNOSIS — K219 Gastro-esophageal reflux disease without esophagitis: Secondary | ICD-10-CM

## 2016-10-05 ENCOUNTER — Ambulatory Visit (INDEPENDENT_AMBULATORY_CARE_PROVIDER_SITE_OTHER): Payer: 59 | Admitting: Physician Assistant

## 2016-10-05 ENCOUNTER — Encounter: Payer: Self-pay | Admitting: Physician Assistant

## 2016-10-05 VITALS — BP 110/70 | HR 72 | Temp 98.3°F | Resp 16 | Wt 157.0 lb

## 2016-10-05 DIAGNOSIS — J4520 Mild intermittent asthma, uncomplicated: Secondary | ICD-10-CM | POA: Diagnosis not present

## 2016-10-05 DIAGNOSIS — L233 Allergic contact dermatitis due to drugs in contact with skin: Secondary | ICD-10-CM | POA: Diagnosis not present

## 2016-10-05 MED ORDER — PREDNISONE 10 MG (21) PO TBPK: ORAL_TABLET | ORAL | 1 refills | Status: DC

## 2016-10-05 MED ORDER — PROAIR HFA 108 (90 BASE) MCG/ACT IN AERS: 2.0000 | INHALATION_SPRAY | RESPIRATORY_TRACT | 5 refills | Status: DC | PRN

## 2016-10-05 NOTE — Progress Notes (Signed)
Patient: Janice Donovan Female    DOB: February 16, 1965   51 y.o.   MRN: [id number] Visit Date: 10/05/2016  Today's Provider: Mar Daring, PA-C   Chief Complaint  Patient presents with  . Rash   Subjective:    HPI Rash: Patient complains of rash involving the bilateral upper leg. Rash started 6 days ago. Appearance of rash at onset: Color of lesion(s): pink. Rash has not changed over time Initial distribution: bilateral upper leg.  Discomfort associated with rash: is pruritic.  Associated symptoms: none. Denies: fever. Patient has not had previous evaluation of rash. Patient has not had previous treatment.  Response to treatment: OTC Benadryl, Hydrocortisone. Patient has not had contacts with similar rash. Patient has not identified precipitant. Patient has had new exposure to [place]one Body spray sun block.     Allergies  Allergen Reactions  . Bee Venom   . Erythromycin      Current Outpatient Prescriptions:  .  buPROPion (WELLBUTRIN XL) 150 MG 24 hr tablet, Take 1 tablet (150 mg total) by mouth daily., Disp: 90 tablet, Rfl: 3 .  calcium carbonate (CALCIUM 600) 1500 (600 Ca) MG TABS tablet, Take 600 mg of elemental calcium by mouth daily with breakfast., Disp: , Rfl:  .  Cholecalciferol (VITAMIN D-1000 MAX ST) 1000 UNITS tablet, Take by mouth., Disp: , Rfl:  .  fexofenadine (ALLEGRA) 180 MG tablet, Take by mouth., Disp: , Rfl:  .  fluticasone (FLONASE) 50 MCG/ACT nasal spray, Place 2 sprays into both nostrils daily., Disp: 48 g, Rfl: 3 .  ibuprofen (ADVIL,MOTRIN) 800 MG tablet, Take 800 mg by mouth 3 (three) times daily with meals. # 90 3 refills, Disp: , Rfl:  .  levonorgestrel-ethinyl estradiol (AVIANE) 0.1-20 MG-MCG tablet, Take 1 tablet by mouth daily., Disp: 1 Package, Rfl: 2 .  montelukast (SINGULAIR) 10 MG tablet, Take 1 tablet (10 mg total) by mouth at bedtime., Disp: 90 tablet, Rfl: 3 .  Multiple Vitamin (MULTI-VITAMINS) TABS, Take by mouth., Disp: , Rfl:  .   pantoprazole (PROTONIX) 40 MG tablet, TAKE 1 TABLET BY MOUTH 2  TIMES DAILY BEFORE A MEAL., Disp: 180 tablet, Rfl: 1 .  PROAIR HFA 108 (90 BASE) MCG/ACT inhaler, Inhale 2 puffs into the lungs every 4 (four) hours as needed., Disp: 18 g, Rfl: 5 .  senna-docusate (SENOKOT-S) 8.6-50 MG per tablet, Take by mouth., Disp: , Rfl:  .  clotrimazole-betamethasone (LOTRISONE) cream, Apply 1 application topically 2 (two) times daily. Apply externally BID prn sx up to 2 wks (Patient not taking: Reported on 10/05/2016), Disp: 15 g, Rfl: 0  Review of Systems  Constitutional: Negative.   Respiratory: Negative.   Cardiovascular: Negative.   Gastrointestinal: Negative.   Musculoskeletal: Negative.   Skin: Positive for rash.  Neurological: Negative.     Social History  Substance Use Topics  . Smoking status: Never Smoker  . Smokeless tobacco: Never Used  . Alcohol use No   Objective:   BP 110/70 (BP Location: Left Arm, Patient Position: Sitting, Cuff Size: Large)   Pulse 72   Temp 98.3 F (36.8 C) (Oral)   Resp 16   Wt 157 lb (71.2 kg)   SpO2 99%   BMI 25.34 kg/m  Vitals:   10/05/16 1109  BP: 110/70  Pulse: 72  Resp: 16  Temp: 98.3 F (36.8 C)  TempSrc: Oral  SpO2: 99%  Weight: 157 lb (71.2 kg)     Physical Exam  Constitutional: She  appears well-developed and well-nourished. No distress.  Neck: Normal range of motion. Neck supple.  Cardiovascular: Normal rate, regular rhythm and normal heart sounds.  Exam reveals no gallop and no friction rub.   No murmur heard. Pulmonary/Chest: Effort normal and breath sounds normal. No respiratory distress. She has no wheezes. She has no rales.  Skin: Rash noted. Rash is papular (diffuse erythematous, non blanching, papular rash noted over entire lower extremities from groin to toes bilaterally c/w distribution of sunscreen). She is not diaphoretic.  Vitals reviewed.       Assessment & Plan:     1. Allergic contact dermatitis due to drugs in  contact with skin Prednisone given as below. Continue benadryl at bedtime for itching prn. May also continue using topical hydrocortisone as needed. She is to call if rash worsens or does not improve.  - predniSONE (STERAPRED UNI-PAK 21 TAB) 10 MG (21) TBPK tablet; Take as directed on package instructions  Dispense: 21 tablet; Refill: 1  2. Mild intermittent asthma without complication Stable. Diagnosis pulled for medication refill. Continue current medical treatment plan. - PROAIR HFA 108 (90 Base) MCG/ACT inhaler; Inhale 2 puffs into the lungs every 4 (four) hours as needed.  Dispense: 18 g; Refill: [place], PA-C  [facility]

## 2016-10-07 ENCOUNTER — Other Ambulatory Visit: Payer: Self-pay | Admitting: Physician Assistant

## 2016-10-07 DIAGNOSIS — J309 Allergic rhinitis, unspecified: Secondary | ICD-10-CM

## 2016-11-09 ENCOUNTER — Ambulatory Visit (INDEPENDENT_AMBULATORY_CARE_PROVIDER_SITE_OTHER): Payer: 59 | Admitting: Family Medicine

## 2016-11-09 ENCOUNTER — Encounter: Payer: Self-pay | Admitting: Family Medicine

## 2016-11-09 VITALS — BP 114/72 | HR 81 | Temp 98.5°F | Wt 162.8 lb

## 2016-11-09 DIAGNOSIS — R05 Cough: Secondary | ICD-10-CM

## 2016-11-09 DIAGNOSIS — J069 Acute upper respiratory infection, unspecified: Secondary | ICD-10-CM

## 2016-11-09 DIAGNOSIS — R059 Cough, unspecified: Secondary | ICD-10-CM

## 2016-11-09 MED ORDER — GUAIFENESIN-CODEINE 100-10 MG/5ML PO SOLN: 5.0000 mL | Freq: Three times a day (TID) | ORAL | 0 refills | Status: DC | PRN

## 2016-11-09 MED ORDER — AMOXICILLIN 875 MG PO TABS: 875.0000 mg | ORAL_TABLET | Freq: Two times a day (BID) | ORAL | 0 refills | Status: DC

## 2016-11-09 NOTE — Progress Notes (Signed)
Patient: Janice Donovan Female    DOB: [birth year]   51 y.o.   MRN: [id number] Visit Date: 11/09/2016  Today's Provider: Vernie Murders, PA   Chief Complaint  Patient presents with  . Sore Throat  . Cough  . post nasal drainage  . Headache   Subjective:    URI   This is a new problem. Episode onset: Last Wednesday. The problem has been gradually worsening. There has been no fever. Associated symptoms include coughing, headaches, sinus pain and a sore throat. Associated symptoms comments: Post nasal drainage  . She has tried inhaler use (Allegra, Flonase and Mucinex D ) for the symptoms. The treatment provided no relief.   Past Medical History:  Diagnosis Date  . Adrenal nodule ([place])   . Allergic rhinitis   . Asthma   . Cervical polyp   . Constipation   . Generalized abdominal pain   . GERD (gastroesophageal reflux disease)   . History of mammogram 2015;01/02/15; 01/06/16   birads 1; birads 1; neg  . History of Papanicolaou smear of cervix 2011;03/10/15   neg; neg  . IBS (irritable bowel syndrome)   . Mood disorder ([place])   . Obesity   . Plantar fasciitis of left foot 09/29/2012  . Renal lesion   . Vaginal birth after cesarean 1993   Past Surgical History:  Procedure Laterality Date  . ANTERIOR CERVICAL DECOMP/DISCECTOMY FUSION  2005   C4-6  . CESAREAN SECTION  1990  . colonoscopy with polypectomy  06/2007; 2016   abd pain and chronic coinstipation  . DIAGNOSTIC LAPAROSCOPY     normal pelvis  . ESOPHAGOGASTRODUODENOSCOPY  06/2007; 2016   gastric polyps  . LEEP  12/06/2008   cx polyp removal-path was HPV changes - [facility]   Family History  Problem Relation Age of Onset  . Cancer Father        Bladder  . Diabetes Father   . Hypertension Father   . Benign prostatic hyperplasia Father   . Bladder Cancer Father   . Hyperlipidemia Father   . Dementia Father   . Asthma Father   . COPD Mother   . Heart disease Mother   . Stroke Mother   . Diabetes Sister   .  Hyperlipidemia Brother   . Hypertension Brother   . Cancer Maternal Grandmother 19       pancreatic  . Prostate cancer Neg Hx   . Kidney disease Neg Hx    Allergies  Allergen Reactions  . Bee Venom   . Erythromycin      Previous Medications   BUPROPION (WELLBUTRIN XL) 150 MG 24 HR TABLET    Take 1 tablet (150 mg total) by mouth daily.   CALCIUM CARBONATE (CALCIUM 600) 1500 (600 CA) MG TABS TABLET    Take 600 mg of elemental calcium by mouth daily with breakfast.   CHOLECALCIFEROL (VITAMIN D-1000 MAX ST) 1000 UNITS TABLET    Take by mouth.   FEXOFENADINE (ALLEGRA) 180 MG TABLET    Take by mouth.   FLUTICASONE (FLONASE) 50 MCG/ACT NASAL SPRAY    USE 2 SPRAYS IN EACH  NOSTRIL DAILY   IBUPROFEN (ADVIL,MOTRIN) 800 MG TABLET    Take 800 mg by mouth 3 (three) times daily with meals. # 90 3 refills   LEVONORGESTREL-ETHINYL ESTRADIOL (AVIANE) 0.1-20 MG-MCG TABLET    Take 1 tablet by mouth daily.   MONTELUKAST (SINGULAIR) 10 MG TABLET    Take 1 tablet (10 mg total) by mouth at  bedtime.   MULTIPLE VITAMIN (MULTI-VITAMINS) TABS    Take by mouth.   PANTOPRAZOLE (PROTONIX) 40 MG TABLET    TAKE 1 TABLET BY MOUTH 2  TIMES DAILY BEFORE A MEAL.   PROAIR HFA 108 (90 BASE) MCG/ACT INHALER    Inhale 2 puffs into the lungs every 4 (four) hours as needed.   SENNA-DOCUSATE (SENOKOT-S) 8.6-50 MG PER TABLET    Take by mouth.    Review of Systems  Constitutional: Negative.   HENT: Positive for sinus pain and sore throat.   Respiratory: Positive for cough.   Cardiovascular: Negative.   Neurological: Positive for headaches.    Social History  Substance Use Topics  . Smoking status: Never Smoker  . Smokeless tobacco: Never Used  . Alcohol use No   Objective:   BP 114/72 (BP Location: Right Arm, Patient Position: Sitting, Cuff Size: Normal)   Pulse 81   Temp 98.5 F (36.9 C) (Oral)   Wt 162 lb 12.8 oz (73.8 kg)   SpO2 99%   BMI 26.28 kg/m   Physical Exam  Constitutional: She is oriented to  person, place, and time. She appears well-developed and well-nourished. No distress.  HENT:  Head: Normocephalic and atraumatic.  Right Ear: Hearing and external ear normal.  Left Ear: Hearing and external ear normal.  Nose: Nose normal.  Slightly irritated posterior pharynx. No exudates. Frontal sinuses with slightly cloudy transillumination. Maxillaries are clear.   Eyes: Conjunctivae and lids are normal. Right eye exhibits no discharge. Left eye exhibits no discharge. No scleral icterus.  Neck: Neck supple.  Cardiovascular: Normal rate and regular rhythm.   Pulmonary/Chest: Effort normal and breath sounds normal. No respiratory distress.  Abdominal: Soft.  Musculoskeletal: Normal range of motion.  Lymphadenopathy:    She has no cervical adenopathy.  Neurological: She is alert and oriented to person, place, and time.  Skin: Skin is intact. No lesion and no rash noted.  Psychiatric: She has a normal mood and affect. Her speech is normal and behavior is normal. Thought content normal.      Assessment & Plan:     1. Upper respiratory tract infection, unspecified type Onset 11-04-16 with irritated throat in the early morning and late evening. Progressed to a dry cough with headache and full congested sensation. Denies fever, earache or GI upset. May continue to use Allegra and Flonase. Given Rx for Amoxil to use if symptoms progress to fever and purulent nasal discharge or sputum. Concerned about getting pneumonia again with hisotry of reactive airway disease. Increase fluid intake and recheck prn. - amoxicillin (AMOXIL) 875 MG tablet; Take 1 tablet (875 mg total) by mouth 2 (two) times daily.  Dispense: 20 tablet; Refill: 0  2. Cough Dry cough since onset without fever. Some tightness in chest and using ProAir-HFA prn. Has a history of reactive airway disease. Given Robitussin-AC prn cough at night. Recheck prn. - guaiFENesin-codeine 100-10 MG/5ML syrup; Take 5 mLs by mouth 3 (three) times  daily as needed for cough.  Dispense: 120 mL; Refill: 0

## 2016-11-12 ENCOUNTER — Ambulatory Visit (INDEPENDENT_AMBULATORY_CARE_PROVIDER_SITE_OTHER): Payer: 59 | Admitting: Physician Assistant

## 2016-11-12 ENCOUNTER — Encounter: Payer: Self-pay | Admitting: Physician Assistant

## 2016-11-12 VITALS — BP 120/80 | HR 72 | Temp 98.1°F | Resp 16 | Wt 159.0 lb

## 2016-11-12 DIAGNOSIS — T3695XA Adverse effect of unspecified systemic antibiotic, initial encounter: Secondary | ICD-10-CM | POA: Diagnosis not present

## 2016-11-12 DIAGNOSIS — B379 Candidiasis, unspecified: Secondary | ICD-10-CM | POA: Diagnosis not present

## 2016-11-12 DIAGNOSIS — J069 Acute upper respiratory infection, unspecified: Secondary | ICD-10-CM

## 2016-11-12 DIAGNOSIS — R059 Cough, unspecified: Secondary | ICD-10-CM

## 2016-11-12 DIAGNOSIS — R05 Cough: Secondary | ICD-10-CM | POA: Diagnosis not present

## 2016-11-12 MED ORDER — FLUCONAZOLE 150 MG PO TABS: 150.0000 mg | ORAL_TABLET | Freq: Once | ORAL | 0 refills | Status: AC

## 2016-11-12 MED ORDER — AMOXICILLIN-POT CLAVULANATE 875-125 MG PO TABS: 1.0000 | ORAL_TABLET | Freq: Two times a day (BID) | ORAL | 0 refills | Status: DC

## 2016-11-12 MED ORDER — PREDNISONE 10 MG (21) PO TBPK: ORAL_TABLET | ORAL | 0 refills | Status: DC

## 2016-11-12 NOTE — Progress Notes (Signed)
Patient: Janice Donovan Female    DOB: 11-26-1965   51 y.o.   MRN: [id number] Visit Date: 11/12/2016  Today's Provider: Mar Daring, PA-C   Chief Complaint  Patient presents with  . URI   Subjective:    HPI Patient here today C/O of worsening chest congestion. Patient was seen on 11/09/16 and was started on Amoxicillin and cough syrup. Patient reports good compliance with medications. Patient reports she has used inhaler and reports no relief.     Allergies  Allergen Reactions  . Bee Venom   . Erythromycin      Current Outpatient Prescriptions:  .  amoxicillin (AMOXIL) 875 MG tablet, Take 1 tablet (875 mg total) by mouth 2 (two) times daily., Disp: 20 tablet, Rfl: 0 .  buPROPion (WELLBUTRIN XL) 150 MG 24 hr tablet, Take 1 tablet (150 mg total) by mouth daily., Disp: 90 tablet, Rfl: 3 .  calcium carbonate (CALCIUM 600) 1500 (600 Ca) MG TABS tablet, Take 600 mg of elemental calcium by mouth daily with breakfast., Disp: , Rfl:  .  Cholecalciferol (VITAMIN D-1000 MAX ST) 1000 UNITS tablet, Take by mouth., Disp: , Rfl:  .  fexofenadine (ALLEGRA) 180 MG tablet, Take by mouth., Disp: , Rfl:  .  fluticasone (FLONASE) 50 MCG/ACT nasal spray, USE 2 SPRAYS IN EACH  NOSTRIL DAILY, Disp: 48 g, Rfl: 1 .  guaiFENesin-codeine 100-10 MG/5ML syrup, Take 5 mLs by mouth 3 (three) times daily as needed for cough., Disp: 120 mL, Rfl: 0 .  ibuprofen (ADVIL,MOTRIN) 800 MG tablet, Take 800 mg by mouth 3 (three) times daily with meals. # 90 3 refills, Disp: , Rfl:  .  levonorgestrel-ethinyl estradiol (AVIANE) 0.1-20 MG-MCG tablet, Take 1 tablet by mouth daily., Disp: 1 Package, Rfl: 2 .  montelukast (SINGULAIR) 10 MG tablet, Take 1 tablet (10 mg total) by mouth at bedtime., Disp: 90 tablet, Rfl: 3 .  Multiple Vitamin (MULTI-VITAMINS) TABS, Take by mouth., Disp: , Rfl:  .  pantoprazole (PROTONIX) 40 MG tablet, TAKE 1 TABLET BY MOUTH 2  TIMES DAILY BEFORE A MEAL., Disp: 180 tablet, Rfl: 1 .   PROAIR HFA 108 (90 Base) MCG/ACT inhaler, Inhale 2 puffs into the lungs every 4 (four) hours as needed., Disp: 18 g, Rfl: 5 .  senna-docusate (SENOKOT-S) 8.6-50 MG per tablet, Take by mouth., Disp: , Rfl:   Review of Systems  Constitutional: Negative.   HENT: Positive for congestion, ear pain (fullness), postnasal drip, rhinorrhea, sinus pain, sinus pressure, sore throat (improoving) and voice change (hoarse).   Eyes: Negative for visual disturbance.  Respiratory: Positive for cough, chest tightness and shortness of breath. Negative for wheezing.   Cardiovascular: Negative.   Gastrointestinal: Negative.   Neurological: Positive for headaches. Negative for dizziness and light-headedness.    Social History  Substance Use Topics  . Smoking status: Never Smoker  . Smokeless tobacco: Never Used  . Alcohol use No   Objective:   BP 120/80 (BP Location: Left Arm, Patient Position: Sitting, Cuff Size: Normal)   Pulse 72   Temp 98.1 F (36.7 C) (Oral)   Resp 16   Wt 159 lb (72.1 kg)   SpO2 99%   BMI 25.66 kg/m  Vitals:   11/12/16 0947  BP: 120/80  Pulse: 72  Resp: 16  Temp: 98.1 F (36.7 C)  TempSrc: Oral  SpO2: 99%  Weight: 159 lb (72.1 kg)     Physical Exam  Constitutional: She appears well-developed and  well-nourished. No distress.  HENT:  Head: Normocephalic and atraumatic.  Right Ear: Hearing, tympanic membrane, external ear and ear canal normal.  Left Ear: Hearing, tympanic membrane, external ear and ear canal normal.  Nose: Right sinus exhibits maxillary sinus tenderness. Right sinus exhibits no frontal sinus tenderness. Left sinus exhibits maxillary sinus tenderness. Left sinus exhibits no frontal sinus tenderness.  Mouth/Throat: Uvula is midline and mucous membranes are normal. Posterior oropharyngeal erythema present. No oropharyngeal exudate or posterior oropharyngeal edema.  Eyes: Pupils are equal, round, and reactive to light. Conjunctivae are normal. Right eye  exhibits no discharge. Left eye exhibits no discharge. No scleral icterus.  Neck: Normal range of motion. Neck supple. No tracheal deviation present. No thyromegaly present.  Cardiovascular: Normal rate, regular rhythm and normal heart sounds.  Exam reveals no gallop and no friction rub.   No murmur heard. Pulmonary/Chest: Effort normal and breath sounds normal. No stridor. No respiratory distress. She has no wheezes. She has no rales.  Lymphadenopathy:    She has no cervical adenopathy.  Skin: Skin is warm and dry. She is not diaphoretic.  Vitals reviewed.     Assessment & Plan:     1. Upper respiratory tract infection, unspecified type Will change to augmentin as below. Will also add prednisone as below for chest tightness as her albuterol has not been giving complete relief. She is to call if symptoms do not improve.  - amoxicillin-clavulanate (AUGMENTIN) 875-125 MG tablet; Take 1 tablet by mouth 2 (two) times daily.  Dispense: 20 tablet; Refill: 0 - predniSONE (STERAPRED UNI-PAK 21 TAB) 10 MG (21) TBPK tablet; Take as directed on package instructions  Dispense: 21 tablet; Refill: 0  2. Cough See above medical treatment plan. - predniSONE (STERAPRED UNI-PAK 21 TAB) 10 MG (21) TBPK tablet; Take as directed on package instructions  Dispense: 21 tablet; Refill: 0  3. Antibiotic-induced yeast infection Gets yeast infection from augmentin. Will give diflucan as below. - fluconazole (DIFLUCAN) 150 MG tablet; Take 1 tablet (150 mg total) by mouth once. May take 2nd tab PO 48-72 hrs after 1st if needed  Dispense: 2 tablet; Refill: 0       Mar Daring, PA-C  [facility]

## 2016-11-12 NOTE — Patient Instructions (Signed)
Upper Respiratory Infection, Adult Most upper respiratory infections (URIs) are a viral infection of the air passages leading to the lungs. A URI affects the nose, throat, and upper air passages. The most common type of URI is nasopharyngitis and is typically referred to as "the common cold." URIs run their course and usually go away on their own. Most of the time, a URI does not require medical attention, but sometimes a bacterial infection in the upper airways can follow a viral infection. This is called a secondary infection. Sinus and middle ear infections are common types of secondary upper respiratory infections. Bacterial pneumonia can also complicate a URI. A URI can worsen asthma and chronic obstructive pulmonary disease (COPD). Sometimes, these complications can require emergency medical care and may be life threatening. What are the causes? Almost all URIs are caused by viruses. A virus is a type of germ and can spread from one person to another. What increases the risk? You may be at risk for a URI if:  You smoke.  You have chronic heart or lung disease.  You have a weakened defense (immune) system.  You are very young or very old.  You have nasal allergies or asthma.  You work in crowded or poorly ventilated areas.  You work in health care facilities or schools.  What are the signs or symptoms? Symptoms typically develop 2-3 days after you come in contact with a cold virus. Most viral URIs last 7-10 days. However, viral URIs from the influenza virus (flu virus) can last 14-18 days and are typically more severe. Symptoms may include:  Runny or stuffy (congested) nose.  Sneezing.  Cough.  Sore throat.  Headache.  Fatigue.  Fever.  Loss of appetite.  Pain in your forehead, behind your eyes, and over your cheekbones (sinus pain).  Muscle aches.  How is this diagnosed? Your health care provider may diagnose a URI by:  Physical exam.  Tests to check that your  symptoms are not due to another condition such as: ? Strep throat. ? Sinusitis. ? Pneumonia. ? Asthma.  How is this treated? A URI goes away on its own with time. It cannot be cured with medicines, but medicines may be prescribed or recommended to relieve symptoms. Medicines may help:  Reduce your fever.  Reduce your cough.  Relieve nasal congestion.  Follow these instructions at home:  Take medicines only as directed by your health care provider.  Gargle warm saltwater or take cough drops to comfort your throat as directed by your health care provider.  Use a warm mist humidifier or inhale steam from a shower to increase air moisture. This may make it easier to breathe.  Drink enough fluid to keep your urine clear or pale yellow.  Eat soups and other clear broths and maintain good nutrition.  Rest as needed.  Return to work when your temperature has returned to normal or as your health care provider advises. You may need to stay home longer to avoid infecting others. You can also use a face mask and careful hand washing to prevent spread of the virus.  Increase the usage of your inhaler if you have asthma.  Do not use any tobacco products, including cigarettes, chewing tobacco, or electronic cigarettes. If you need help quitting, ask your health care provider. How is this prevented? The best way to protect yourself from getting a cold is to practice good hygiene.  Avoid oral or hand contact with people with cold symptoms.  Wash your   hands often if contact occurs.  There is no clear evidence that vitamin C, vitamin E, echinacea, or exercise reduces the chance of developing a cold. However, it is always recommended to get plenty of rest, exercise, and practice good nutrition. Contact a health care provider if:  You are getting worse rather than better.  Your symptoms are not controlled by medicine.  You have chills.  You have worsening shortness of breath.  You have  brown or red mucus.  You have yellow or brown nasal discharge.  You have pain in your face, especially when you bend forward.  You have a fever.  You have swollen neck glands.  You have pain while swallowing.  You have white areas in the back of your throat. Get help right away if:  You have severe or persistent: ? Headache. ? Ear pain. ? Sinus pain. ? Chest pain.  You have chronic lung disease and any of the following: ? Wheezing. ? Prolonged cough. ? Coughing up blood. ? A change in your usual mucus.  You have a stiff neck.  You have changes in your: ? Vision. ? Hearing. ? Thinking. ? Mood. This information is not intended to replace advice given to you by your health care provider. Make sure you discuss any questions you have with your health care provider. Document Released: 07/22/2000 Document Revised: 09/29/2015 Document Reviewed: 05/03/2013 Elsevier Interactive Patient Education  2017 Elsevier Inc.  

## 2016-11-23 ENCOUNTER — Other Ambulatory Visit: Payer: Self-pay | Admitting: Obstetrics and Gynecology

## 2016-11-23 ENCOUNTER — Encounter: Payer: Self-pay | Admitting: Obstetrics and Gynecology

## 2016-11-23 MED ORDER — LEVONORGESTREL-ETHINYL ESTRAD 0.1-20 MG-MCG PO TABS: 1.0000 | ORAL_TABLET | Freq: Every day | ORAL | 0 refills | Status: DC

## 2016-11-23 NOTE — Progress Notes (Signed)
Rx RF till annual due 11/18

## 2016-11-24 ENCOUNTER — Encounter: Payer: Self-pay | Admitting: Certified Nurse Midwife

## 2016-11-24 ENCOUNTER — Other Ambulatory Visit: Payer: Self-pay | Admitting: Certified Nurse Midwife

## 2016-11-26 ENCOUNTER — Other Ambulatory Visit: Payer: Self-pay | Admitting: Certified Nurse Midwife

## 2016-11-26 DIAGNOSIS — Z1239 Encounter for other screening for malignant neoplasm of breast: Secondary | ICD-10-CM

## 2016-12-06 ENCOUNTER — Other Ambulatory Visit: Payer: Self-pay | Admitting: Obstetrics and Gynecology

## 2016-12-07 DIAGNOSIS — Z23 Encounter for immunization: Secondary | ICD-10-CM | POA: Diagnosis not present

## 2016-12-08 ENCOUNTER — Other Ambulatory Visit: Payer: Self-pay | Admitting: Obstetrics and Gynecology

## 2016-12-08 NOTE — Telephone Encounter (Signed)
[facility] is calling about prescription that was faxed. Pt is resquest refill on birthcontrol. [facility] will send a new faxed for prescription refill

## 2016-12-08 NOTE — Telephone Encounter (Signed)
[facility] was calling to see if they needed to cancel generic and dispense sronyx.  Adv yes.

## 2017-01-11 ENCOUNTER — Encounter: Payer: Self-pay | Admitting: Certified Nurse Midwife

## 2017-01-11 ENCOUNTER — Ambulatory Visit (INDEPENDENT_AMBULATORY_CARE_PROVIDER_SITE_OTHER): Payer: 59 | Admitting: Certified Nurse Midwife

## 2017-01-11 ENCOUNTER — Ambulatory Visit
Admission: RE | Admit: 2017-01-11 | Discharge: 2017-01-11 | Disposition: A | Discharge status: Home / Self Care | Payer: 59 | Source: Ambulatory Visit | Attending: Certified Nurse Midwife | Admitting: Certified Nurse Midwife

## 2017-01-11 VITALS — BP 110/70 | HR 77 | Ht 66.0 in | Wt 171.0 lb

## 2017-01-11 DIAGNOSIS — Z01419 Encounter for gynecological examination (general) (routine) without abnormal findings: Secondary | ICD-10-CM

## 2017-01-11 DIAGNOSIS — Z1231 Encounter for screening mammogram for malignant neoplasm of breast: Secondary | ICD-10-CM | POA: Insufficient documentation

## 2017-01-11 DIAGNOSIS — Z1239 Encounter for other screening for malignant neoplasm of breast: Secondary | ICD-10-CM

## 2017-01-11 DIAGNOSIS — Z124 Encounter for screening for malignant neoplasm of cervix: Secondary | ICD-10-CM | POA: Diagnosis not present

## 2017-01-11 MED ORDER — LEVONORGESTREL-ETHINYL ESTRAD 0.1-20 MG-MCG PO TABS: 1.0000 | ORAL_TABLET | Freq: Every day | ORAL | 3 refills | Status: DC

## 2017-01-11 NOTE — Progress Notes (Signed)
/    Gynecology Annual Exam  PCP: Mar Daring, PA-C  Chief Complaint:  Chief Complaint  Patient presents with  . Gynecologic Exam    History of Present Illness:Janice Donovan is a 51 year old Caucasian/White female, [place], who presents for her annual exam. She is having no significant GYN problems. Was having some problems with break through bleeding and hot flashes on her Loestrin 24 and was switched to Alesse generic in July. Since then her break through bleeding has resolved and her menses are regular and her LMP was 12/14/2016 . They occur every 28 days, they last 1-2 and are light flow.  She reports mild  dysmenorrhea. She uses ibuprofen 800mg m with symptomatic relief.  The patient's past medical history is notable for a history of asthma, allergies, GERD, constipation, and a mood disorder. She is doing well on her Wellbutrin.  She has a history of having a LEEP 12/06/2008 for a endocervical polyp and path returned HPV. Pap smears since then have been normal. Her last Pap smear 11/29/2017was NIL.  She is sexually active. She uses a generic of Alesse for [facility].   Her most recent mammogram obtained 01/07/2016 and it was negative.  There is no family history of breast cancer.  There is no family history of ovarian cancer.  The patient does occ self breast exams.  She had a colonoscopy in in 2016 that was normal. Her next colonoscopy is due in 10 years.  A DEXA scan is not applicable for this patient.  The patient does not smoke.  The patient does not drink alcohol.  The patient does not use illegal drugs.  The patient exercises occasionally by walking.  The patient gets adequate calcium in her diet.  She had a recent cholesterol screen in 2018 that was normal.   .    Review of Systems: Review of Systems  Constitutional: Negative for chills, fever and weight loss.  HENT: Negative for congestion, sinus pain and sore throat.   Eyes: Negative for blurred vision and  pain.  Respiratory: Negative for hemoptysis, shortness of breath and wheezing.   Cardiovascular: Positive for palpitations. Negative for chest pain and leg swelling.  Gastrointestinal: Positive for constipation. Negative for abdominal pain, blood in stool, diarrhea, heartburn, nausea and vomiting.  Genitourinary: Negative for dysuria, frequency, hematuria and urgency.  Musculoskeletal: Negative for back pain, joint pain and myalgias.  Skin: Negative for itching and rash.  Neurological: Negative for dizziness, tingling and headaches.  Endo/Heme/Allergies: Negative for environmental allergies and polydipsia. Does not bruise/bleed easily.       Negative for hirsutism   Psychiatric/Behavioral: Negative for depression. The patient is not nervous/anxious and does not have insomnia.     Past Medical History:  Past Medical History:  Diagnosis Date  . Adrenal nodule ([place])   . Allergic rhinitis   . Asthma   . Cervical polyp   . Constipation   . Generalized abdominal pain   . GERD (gastroesophageal reflux disease)   . History of mammogram 2015;01/02/15; 01/06/16   birads 1; birads 1; neg  . History of Papanicolaou smear of cervix 2011;03/10/15   neg; neg  . IBS (irritable bowel syndrome)   . Mood disorder ([place])   . Obesity   . Plantar fasciitis of left foot 09/29/2012  . Renal lesion   . Vaginal birth after cesarean 1993    Past Surgical History:  Past Surgical History:  Procedure Laterality Date  . ANTERIOR CERVICAL DECOMP/DISCECTOMY FUSION  2005   C4-6  . CESAREAN SECTION  1990  . colonoscopy with polypectomy  06/2007; 2016   abd pain and chronic coinstipation  . DIAGNOSTIC LAPAROSCOPY     normal pelvis  . ESOPHAGOGASTRODUODENOSCOPY  06/2007; 2016   gastric polyps  . LEEP  12/06/2008   cx polyp removal-path was HPV changes - [facility]    Family History:  Family History  Problem Relation Age of Onset  . Diabetes Father   . Hypertension Father   . Benign prostatic hyperplasia  Father   . Bladder Cancer Father   . Hyperlipidemia Father   . Dementia Father        Lewy Body Dementia  . Asthma Father   . Melanoma Father   . COPD Mother   . Heart disease Mother   . Stroke Mother   . Diabetes Sister   . Hyperlipidemia Brother   . Hypertension Brother   . Cancer Maternal Grandmother 30       pancreatic  . Prostate cancer Neg Hx   . Kidney disease Neg Hx     Social History:  Social History   Socioeconomic History  . Marital status: Married    Spouse name: Not on file  . Number of children: 2  . Years of education: H/S  . Highest education level: Not on file  Social Needs  . Financial resource strain: Not on file  . Food insecurity - worry: Not on file  . Food insecurity - inability: Not on file  . Transportation needs - medical: Not on file  . Transportation needs - non-medical: Not on file  Occupational History  . Occupation: Programme researcher, broadcasting/film/video: [employer]  Tobacco Use  . Smoking status: Never Smoker  . Smokeless tobacco: Never Used  Substance and Sexual Activity  . Alcohol use: No    Alcohol/week: 0.0 oz  . Drug use: No  . Sexual activity: Yes    Partners: Male    Birth control/protection: Pill  Other Topics Concern  . Not on file  Social History Narrative   Rache's daughter Apolonio Schneiders has 3 children -ages 48,2,and 92(2018). Her son is a Education officer, museum.    Allergies:  Allergies  Allergen Reactions  . Erythromycin Swelling  . Bee Venom     Medications:  Current Outpatient Medications:  .  B Complex Vitamins (VITAMIN B COMPLEX 100 IJ), Take by mouth., Disp: , Rfl:  .  buPROPion (WELLBUTRIN XL) 150 MG 24 hr tablet, Take 1 tablet (150 mg total) by mouth daily., Disp: 90 tablet, Rfl: 3 .  calcium carbonate (CALCIUM 600) 1500 (600 Ca) MG TABS tablet, Take 600 mg of elemental calcium by mouth daily with breakfast., Disp: , Rfl:  .  Cholecalciferol (VITAMIN D-1000 MAX ST) 1000 UNITS tablet, Take by mouth., Disp: , Rfl:  .   fexofenadine (ALLEGRA) 180 MG tablet, Take by mouth., Disp: , Rfl:  .  FLUCELVAX QUADRIVALENT 0.5 ML SUSY, TO BE ADMINISTERED BY PHARMACIST FOR IMMUNIZATION, Disp: , Rfl: 0 .  fluticasone (FLONASE) 50 MCG/ACT nasal spray, USE 2 SPRAYS IN EACH  NOSTRIL DAILY, Disp: 48 g, Rfl: 1 .  ibuprofen (ADVIL,MOTRIN) 800 MG tablet, Take 800 mg by mouth 3 (three) times daily with meals. # 90 3 refills, Disp: , Rfl:  .  levonorgestrel-ethinyl estradiol (SRONYX) 0.1-20 MG-MCG tablet, Take 1 tablet by mouth daily., Disp: 84 tablet, Rfl: 3 .  montelukast (SINGULAIR) 10 MG tablet, Take 1 tablet (10 mg total) by mouth at bedtime.,  Disp: 90 tablet, Rfl: 3 .  Multiple Vitamin (MULTI-VITAMINS) TABS, Take by mouth., Disp: , Rfl:  .  pantoprazole (PROTONIX) 40 MG tablet, TAKE 1 TABLET BY MOUTH 2  TIMES DAILY BEFORE A MEAL., Disp: 180 tablet, Rfl: 1 .  PROAIR HFA 108 (90 Base) MCG/ACT inhaler, Inhale 2 puffs into the lungs every 4 (four) hours as needed., Disp: 18 g, Rfl: 5 .  senna-docusate (SENOKOT-S) 8.6-50 MG per tablet, Take by mouth., Disp: , Rfl:  Physical Exam Vitals: BP 110/70   Pulse 77   Ht 5\' 6"  (1.676 m)   Wt 171 lb (77.6 kg)   LMP 12/14/2016 (Exact Date)   BMI 27.60 kg/m   General: WF in NAD HEENT: normocephalic, anicteric Neck: no thyroid enlargement, no palpable nodules, no cervical lymphadenopathy  Pulmonary: No increased work of breathing, CTAB Cardiovascular: RRR, without murmur  Breast: Breast symmetrical, no tenderness, no palpable nodules or masses, no skin or nipple retraction present, no nipple discharge.  No axillary, infraclavicular or supraclavicular lymphadenopathy. Abdomen: Soft, non-tender, non-distended.  Umbilicus without lesions.  No hepatomegaly or masses palpable. No evidence of hernia. Genitourinary:  External: Normal external female genitalia.  Normal urethral meatus, normal Bartholin's and Skene's glands.    Vagina: Normal vaginal mucosa, no evidence of prolapse.    Cervix:  Grossly normal in appearance, no bleeding, non-tender  Uterus: Anteverted, globular and irregular fundus, mobile, and non-tender  Adnexa: No adnexal masses, non-tender  Rectal: deferred  Lymphatic: no evidence of inguinal lymphadenopathy Extremities: no edema, erythema, or tenderness Neurologic: Grossly intact Psychiatric: mood appropriate, affect full     Assessment: 51 y.o. [id number] annual gyn exam  Plan:    1) Breast cancer screening - recommend monthly self breast exam and annual screening mammograms. Mammogram is scheduled for today at 1400.  2) Colon cancer screen: colonoscopy 2016 is UTD.  3) Cervical cancer screening - Pap was done.   4) Contraception -continue Sronyx #1/RFx11. Consider checking FSH and LH next year.   5) Routine healthcare maintenance including cholesterol and diabetes screening managed by PCP   6) RTO 1 year and prn  Dalia Heading, CNM

## 2017-01-12 ENCOUNTER — Encounter: Payer: Self-pay | Admitting: Certified Nurse Midwife

## 2017-01-14 LAB — IGP,CTNG,APTIMAHPV
CHLAMYDIA, NUC. ACID AMP: NEGATIVE
GONOCOCCUS BY NUCLEIC ACID AMP: NEGATIVE
HPV APTIMA: NEGATIVE
PAP Smear Comment: 0

## 2017-01-15 ENCOUNTER — Encounter: Payer: Self-pay | Admitting: Certified Nurse Midwife

## 2017-02-12 ENCOUNTER — Encounter: Payer: Self-pay | Admitting: Family Medicine

## 2017-02-12 ENCOUNTER — Ambulatory Visit: Payer: BLUE CROSS/BLUE SHIELD | Admitting: Family Medicine

## 2017-02-12 VITALS — BP 128/78 | HR 81 | Temp 98.7°F | Resp 14 | Wt 178.2 lb

## 2017-02-12 DIAGNOSIS — J4520 Mild intermittent asthma, uncomplicated: Secondary | ICD-10-CM | POA: Diagnosis not present

## 2017-02-12 DIAGNOSIS — J069 Acute upper respiratory infection, unspecified: Secondary | ICD-10-CM

## 2017-02-12 MED ORDER — DOXYCYCLINE HYCLATE 100 MG PO TABS: 100.0000 mg | ORAL_TABLET | Freq: Two times a day (BID) | ORAL | 0 refills | Status: DC

## 2017-02-12 MED ORDER — FLUCONAZOLE 150 MG PO TABS: 150.0000 mg | ORAL_TABLET | Freq: Once | ORAL | 0 refills | Status: AC

## 2017-02-12 MED ORDER — HYDROCODONE-HOMATROPINE 5-1.5 MG/5ML PO SYRP: ORAL_SOLUTION | ORAL | 0 refills | Status: DC

## 2017-02-12 NOTE — Progress Notes (Signed)
Subjective:     Patient ID: Janice Donovan, female   DOB: February 01, 1966, 52 y.o.   MRN: [id number] Chief Complaint  Patient presents with  . URI    Patient comes in office today with conerns of cold like symptoms for one week. Patient reports the following symptoms; cough, runny nose, sore throat, congestion, sinus pain/pressure and tightness in her chest. Patient has tried taking toc Mucinex D, Flonase and Allegra   HPI States she has used her albuterol once today. Does not usually have asthma sx unless sick. Review of Systems     Objective:   Physical Exam  Constitutional: She appears well-developed and well-nourished. No distress.  Ears: T.M's intact without inflammation Sinuses: mild paranasal sinus tenderness Throat: no tonsillar enlargement or exudate Neck: no cervical adenopathy Lungs: clear     Assessment:    1. Viral upper respiratory tract infection - HYDROcodone-homatropine (HYCODAN) 5-1.5 MG/5ML syrup; 5 ml 4-6 hours as needed for cough  Dispense: 120 mL; Refill: 0 - fluconazole (DIFLUCAN) 150 MG tablet; Take 1 tablet (150 mg total) by mouth once for 1 dose.  Dispense: 1 tablet; Refill: 0  2. Mild intermittent asthma without complication: schedule albuterol while ill.     Plan:    Continue Mucinex D. Start abx if sinuses not improving over the next two days.

## 2017-02-12 NOTE — Patient Instructions (Addendum)
Continue Mucinex D. Schedule albuterol twice daily while ill. If your sinuses do not improve over the next two days start the antibiotic.

## 2017-02-18 ENCOUNTER — Other Ambulatory Visit: Payer: Self-pay | Admitting: Physician Assistant

## 2017-02-18 DIAGNOSIS — K219 Gastro-esophageal reflux disease without esophagitis: Secondary | ICD-10-CM

## 2017-02-18 DIAGNOSIS — J3089 Other allergic rhinitis: Secondary | ICD-10-CM

## 2017-02-18 DIAGNOSIS — F325 Major depressive disorder, single episode, in full remission: Secondary | ICD-10-CM

## 2017-06-22 ENCOUNTER — Ambulatory Visit (INDEPENDENT_AMBULATORY_CARE_PROVIDER_SITE_OTHER): Payer: BLUE CROSS/BLUE SHIELD | Admitting: Podiatry

## 2017-06-22 ENCOUNTER — Encounter: Payer: Self-pay | Admitting: Podiatry

## 2017-06-22 DIAGNOSIS — L989 Disorder of the skin and subcutaneous tissue, unspecified: Secondary | ICD-10-CM

## 2017-06-24 NOTE — Progress Notes (Signed)
   Subjective: 52 year old female presenting today with a chief complaint of sharp pain to the plantar aspect of the right forefoot that began several months ago. She states she is beginning to have similar pain to the plantar aspect of the left forefoot as well. Walking and standing for long periods of time increases the pain. She has not done anything for treatment. Patient is here for further evaluation and treatment.   Past Medical History:  Diagnosis Date  . Adrenal nodule ([place])   . Allergic rhinitis   . Asthma   . Cervical polyp   . Constipation   . Generalized abdominal pain   . GERD (gastroesophageal reflux disease)   . History of mammogram 2015;01/02/15; 01/06/16   birads 1; birads 1; neg  . History of Papanicolaou smear of cervix 2011;03/10/15   neg; neg  . IBS (irritable bowel syndrome)   . Mood disorder ([place])   . Obesity   . Plantar fasciitis of left foot 09/29/2012  . Renal lesion   . Vaginal birth after cesarean 1993     Objective:  Physical Exam General: Alert and oriented x3 in no acute distress  Dermatology: Hyperkeratotic lesion present on the bilateral feet x 2. Pain on palpation with a central nucleated core noted. Skin is warm, dry and supple bilateral lower extremities. Negative for open lesions or macerations.  Vascular: Palpable pedal pulses bilaterally. No edema or erythema noted. Capillary refill within normal limits.  Neurological: Epicritic and protective threshold grossly intact bilaterally.   Musculoskeletal Exam: Pain on palpation at the keratotic lesion noted. Range of motion within normal limits bilateral. Muscle strength 5/5 in all groups bilateral.  Assessment: 1. Porokeratosis bilateral x 2   Plan of Care:  1. Patient evaluated 2. Excisional debridement of keratoic lesion using a chisel blade was performed without incident.  3. Salinocaine applied to areas then dressed with light dressing. 4. Patient is to return to the clinic PRN.    Edrick Kins, DPM [facility]  Dr. Edrick Kins, [address]                                        [place], [place] [address]                Office [phone]  Fax [phone]

## 2017-07-02 ENCOUNTER — Ambulatory Visit: Payer: BLUE CROSS/BLUE SHIELD | Admitting: Physician Assistant

## 2017-07-02 ENCOUNTER — Encounter: Payer: Self-pay | Admitting: Physician Assistant

## 2017-07-02 VITALS — BP 118/72 | HR 68 | Temp 97.9°F | Resp 16 | Ht 66.0 in | Wt 159.0 lb

## 2017-07-02 DIAGNOSIS — J4520 Mild intermittent asthma, uncomplicated: Secondary | ICD-10-CM

## 2017-07-02 DIAGNOSIS — B373 Candidiasis of vulva and vagina: Secondary | ICD-10-CM

## 2017-07-02 DIAGNOSIS — D508 Other iron deficiency anemias: Secondary | ICD-10-CM | POA: Diagnosis not present

## 2017-07-02 DIAGNOSIS — K581 Irritable bowel syndrome with constipation: Secondary | ICD-10-CM | POA: Diagnosis not present

## 2017-07-02 DIAGNOSIS — E559 Vitamin D deficiency, unspecified: Secondary | ICD-10-CM

## 2017-07-02 DIAGNOSIS — Z713 Dietary counseling and surveillance: Secondary | ICD-10-CM | POA: Diagnosis not present

## 2017-07-02 DIAGNOSIS — B3731 Acute candidiasis of vulva and vagina: Secondary | ICD-10-CM

## 2017-07-02 MED ORDER — NYSTATIN-TRIAMCINOLONE 100000-0.1 UNIT/GM-% EX CREA: 1.0000 "application " | TOPICAL_CREAM | Freq: Two times a day (BID) | CUTANEOUS | 0 refills | Status: DC | PRN

## 2017-07-02 NOTE — Progress Notes (Signed)
Patient: Janice Donovan Female    DOB: [birth year]   52 y.o.   MRN: [id number] Visit Date: 07/02/2017  Today's Provider: Mar Daring, PA-C   Chief Complaint  Patient presents with  . Follow-up   Subjective:    HPI  Follow up for asthma  The patient was last seen for this 6 months ago. Changes made at last visit include no changes.  She reports excellent compliance with treatment. She feels that condition is Improved. She is not having side effects.  ------------------------------------------------------------------------------------ Patient reports she started Keto diet a few months ago and would like to have her labs checked today.   Patient is requesting refill on nystatin and triamcinolone which was prescribed by GYN.     Allergies  Allergen Reactions  . Erythromycin Swelling  . Bee Venom      Current Outpatient Medications:  .  buPROPion (WELLBUTRIN XL) 150 MG 24 hr tablet, TAKE 1 TABLET BY MOUTH  DAILY, Disp: 90 tablet, Rfl: 3 .  calcium carbonate (CALCIUM 600) 1500 (600 Ca) MG TABS tablet, Take 600 mg of elemental calcium by mouth daily with breakfast., Disp: , Rfl:  .  fexofenadine (ALLEGRA) 180 MG tablet, Take by mouth., Disp: , Rfl:  .  fluticasone (FLONASE) 50 MCG/ACT nasal spray, USE 2 SPRAYS IN EACH  NOSTRIL DAILY, Disp: 48 g, Rfl: 1 .  ibuprofen (ADVIL,MOTRIN) 800 MG tablet, Take 800 mg by mouth 3 (three) times daily with meals. # 90 3 refills, Disp: , Rfl:  .  levonorgestrel-ethinyl estradiol (SRONYX) 0.1-20 MG-MCG tablet, Take 1 tablet by mouth daily., Disp: 84 tablet, Rfl: 3 .  montelukast (SINGULAIR) 10 MG tablet, TAKE 1 TABLET BY MOUTH AT  BEDTIME, Disp: 90 tablet, Rfl: 3 .  Multiple Vitamin (MULTI-VITAMINS) TABS, Take by mouth., Disp: , Rfl:  .  pantoprazole (PROTONIX) 40 MG tablet, TAKE 1 TABLET BY MOUTH 2  TIMES DAILY BEFORE A MEAL., Disp: 180 tablet, Rfl: 1 .  PROAIR HFA 108 (90 Base) MCG/ACT inhaler, Inhale 2 puffs into the lungs  every 4 (four) hours as needed., Disp: 18 g, Rfl: 5 .  senna-docusate (SENOKOT-S) 8.6-50 MG per tablet, Take by mouth., Disp: , Rfl:   Review of Systems  Constitutional: Negative.   Respiratory: Negative.   Cardiovascular: Negative.   Genitourinary: Negative.   Neurological: Negative.     Social History   Tobacco Use  . Smoking status: Never Smoker  . Smokeless tobacco: Never Used  Substance Use Topics  . Alcohol use: No    Alcohol/week: 0.0 oz   Objective:   BP 118/72 (BP Location: Left Arm, Patient Position: Sitting, Cuff Size: Normal)   Pulse 68   Temp 97.9 F (36.6 C) (Oral)   Resp 16   Ht 5\' 6"  (1.676 m)   Wt 159 lb (72.1 kg)   SpO2 99%   BMI 25.66 kg/m  Vitals:   07/02/17 1020  BP: 118/72  Pulse: 68  Resp: 16  Temp: 97.9 F (36.6 C)  TempSrc: Oral  SpO2: 99%  Weight: 159 lb (72.1 kg)  Height: 5\' 6"  (1.676 m)     Physical Exam  Constitutional: She appears well-developed and well-nourished. No distress.  Neck: Normal range of motion. Neck supple. No JVD present. No tracheal deviation present. No thyromegaly present.  Cardiovascular: Normal rate, regular rhythm and normal heart sounds. Exam reveals no gallop and no friction rub.  No murmur heard. Pulmonary/Chest: Effort normal and breath sounds normal.  No respiratory distress. She has no wheezes. She has no rales.  Musculoskeletal: She exhibits no edema.  Lymphadenopathy:    She has no cervical adenopathy.  Skin: She is not diaphoretic.  Vitals reviewed.       Assessment & Plan:     1. Mild intermittent asthma without complication Normal spirometry. Ok to continue albuterol prn. - Spirometry with Graph  2. Ketogenic diet monitoring encounter Doing well. Has lost 20 pounds since January. Will check labs as below and f/u pending results. - CBC w/Diff/Platelet - Comprehensive Metabolic Panel (CMET) - Lipid Profile - HgB A1c - Fe+TIBC+Fer  3. Yeast vaginitis Stable. Diagnosis pulled for  medication refill. Continue current medical treatment plan. - nystatin-triamcinolone (MYCOLOG II) cream; Apply 1 application topically 2 (two) times daily as needed.  Dispense: 60 g; Refill: 0  4. Avitaminosis D H/O this. Post menopausal. Will check labs as below and f/u pending results. - CBC w/Diff/Platelet - Comprehensive Metabolic Panel (CMET) - Vitamin D (25 hydroxy)  5. Iron deficiency anemia secondary to inadequate dietary iron intake H/O this. Will check labs as below and f/u pending results. - CBC w/Diff/Platelet - Comprehensive Metabolic Panel (CMET) - Fe+TIBC+Fer  6. Irritable bowel syndrome with constipation Stable. Uses stool softener daily and laxative prn. Will check labs as below and f/u pending results. - CBC w/Diff/Platelet - Comprehensive Metabolic Panel (CMET)       Mar Daring, PA-C  [facility]

## 2017-07-03 LAB — IRON,TIBC AND FERRITIN PANEL
Ferritin: 10 ng/mL — ABNORMAL LOW (ref 15–150)
IRON SATURATION: 17 % (ref 15–55)
Iron: 54 ug/dL (ref 27–159)
TIBC: 314 ug/dL (ref 250–450)
UIBC: 260 ug/dL (ref 131–425)

## 2017-07-03 LAB — CBC WITH DIFFERENTIAL/PLATELET
BASOS: 0 %
Basophils Absolute: 0 10*3/uL (ref 0.0–0.2)
EOS (ABSOLUTE): 0.1 10*3/uL (ref 0.0–0.4)
EOS: 1 %
HEMATOCRIT: 34 % (ref 34.0–46.6)
Hemoglobin: 11 g/dL — ABNORMAL LOW (ref 11.1–15.9)
Immature Grans (Abs): 0 10*3/uL (ref 0.0–0.1)
Immature Granulocytes: 0 %
Lymphocytes Absolute: 1.1 10*3/uL (ref 0.7–3.1)
Lymphs: 23 %
MCH: 28.9 pg (ref 26.6–33.0)
MCHC: 32.4 g/dL (ref 31.5–35.7)
MCV: 90 fL (ref 79–97)
MONOS ABS: 0.4 10*3/uL (ref 0.1–0.9)
Monocytes: 8 %
NEUTROS ABS: 3.2 10*3/uL (ref 1.4–7.0)
Neutrophils: 68 %
Platelets: 244 10*3/uL (ref 150–450)
RBC: 3.8 x10E6/uL (ref 3.77–5.28)
RDW: 15.1 % (ref 12.3–15.4)
WBC: 4.8 10*3/uL (ref 3.4–10.8)

## 2017-07-03 LAB — COMPREHENSIVE METABOLIC PANEL
A/G RATIO: 2.1 (ref 1.2–2.2)
ALK PHOS: 76 IU/L (ref 39–117)
ALT: 10 IU/L (ref 0–32)
AST: 17 IU/L (ref 0–40)
Albumin: 4.4 g/dL (ref 3.5–5.5)
BUN/Creatinine Ratio: 16 (ref 9–23)
BUN: 9 mg/dL (ref 6–24)
Bilirubin Total: 0.4 mg/dL (ref 0.0–1.2)
CALCIUM: 9.3 mg/dL (ref 8.7–10.2)
CO2: 23 mmol/L (ref 20–29)
Chloride: 105 mmol/L (ref 96–106)
Creatinine, Ser: 0.58 mg/dL (ref 0.57–1.00)
GFR calc Af Amer: 123 mL/min/{1.73_m2} (ref >59)
GFR, EST NON AFRICAN AMERICAN: 107 mL/min/{1.73_m2} (ref >59)
Globulin, Total: 2.1 g/dL (ref 1.5–4.5)
Glucose: 78 mg/dL (ref 65–99)
Potassium: 4.3 mmol/L (ref 3.5–5.2)
Sodium: 141 mmol/L (ref 134–144)
Total Protein: 6.5 g/dL (ref 6.0–8.5)

## 2017-07-03 LAB — VITAMIN D 25 HYDROXY (VIT D DEFICIENCY, FRACTURES): VIT D 25 HYDROXY: 38.5 ng/mL (ref 30.0–100.0)

## 2017-07-03 LAB — HEMOGLOBIN A1C
Est. average glucose Bld gHb Est-mCnc: 100 mg/dL
Hgb A1c MFr Bld: 5.1 % (ref 4.8–5.6)

## 2017-07-03 LAB — LIPID PANEL
CHOL/HDL RATIO: 2.7 ratio (ref 0.0–4.4)
Cholesterol, Total: 204 mg/dL — ABNORMAL HIGH (ref 100–199)
HDL: 76 mg/dL (ref >39)
LDL Calculated: 118 mg/dL — ABNORMAL HIGH (ref 0–99)
TRIGLYCERIDES: 50 mg/dL (ref 0–149)
VLDL Cholesterol Cal: 10 mg/dL (ref 5–40)

## 2017-07-06 ENCOUNTER — Encounter: Payer: Self-pay | Admitting: Physician Assistant

## 2017-07-06 ENCOUNTER — Telehealth: Payer: Self-pay

## 2017-07-06 NOTE — Telephone Encounter (Signed)
-----   Message from Mar Daring, [place] sent at 07/06/2017 12:53 PM EDT ----- All labs are WNL and stable with exception of hemoglobin and ferritin which have both decreased since last check. Would recommend starting an iron supplement again at least once daily or increasing iron rich foods in diet. Can recheck in 8-12 weeks if patient desires.

## 2017-07-06 NOTE — Telephone Encounter (Signed)
Viewed by Marylouise Stacks on 07/06/2017 2:04 PM

## 2017-07-07 ENCOUNTER — Encounter: Payer: Self-pay | Admitting: Physician Assistant

## 2017-07-24 ENCOUNTER — Other Ambulatory Visit: Payer: Self-pay | Admitting: Physician Assistant

## 2017-07-24 DIAGNOSIS — K219 Gastro-esophageal reflux disease without esophagitis: Secondary | ICD-10-CM

## 2017-09-16 ENCOUNTER — Telehealth: Payer: Self-pay | Admitting: Certified Nurse Midwife

## 2017-09-16 MED ORDER — ESTRADIOL 1 MG PO TABS: 1.0000 mg | ORAL_TABLET | Freq: Every day | ORAL | 0 refills | Status: DC

## 2017-09-16 NOTE — Telephone Encounter (Signed)
Patient called in response to her MYchart message. Has not missed pills and has not been on any antibotics.  Advised to take pregnancy test and if negative will treat her for possible atrophic endmetrium. She is currently bleeding on her third week of her OCPs. Advised to skip the placebo pills in her current pack and take 1 mgm of estradiol along with the first two weeks of pills in her next pill pack. If  BTB persists after taht will do further evaluation. Dalia Heading, CNM

## 2017-09-20 ENCOUNTER — Encounter: Payer: Self-pay | Admitting: Physician Assistant

## 2017-10-27 ENCOUNTER — Ambulatory Visit: Payer: BLUE CROSS/BLUE SHIELD | Admitting: Physician Assistant

## 2017-10-27 ENCOUNTER — Encounter: Payer: Self-pay | Admitting: Physician Assistant

## 2017-10-27 VITALS — BP 120/80 | HR 80 | Temp 98.1°F | Resp 16 | Wt 152.8 lb

## 2017-10-27 DIAGNOSIS — T3695XA Adverse effect of unspecified systemic antibiotic, initial encounter: Secondary | ICD-10-CM

## 2017-10-27 DIAGNOSIS — J0140 Acute pansinusitis, unspecified: Secondary | ICD-10-CM

## 2017-10-27 DIAGNOSIS — B379 Candidiasis, unspecified: Secondary | ICD-10-CM

## 2017-10-27 MED ORDER — AMOXICILLIN-POT CLAVULANATE 875-125 MG PO TABS: 1.0000 | ORAL_TABLET | Freq: Two times a day (BID) | ORAL | 0 refills | Status: DC

## 2017-10-27 MED ORDER — FLUCONAZOLE 150 MG PO TABS: 150.0000 mg | ORAL_TABLET | Freq: Once | ORAL | 0 refills | Status: AC

## 2017-10-27 MED ORDER — PREDNISONE 10 MG (21) PO TBPK: ORAL_TABLET | ORAL | 0 refills | Status: DC

## 2017-10-27 NOTE — Patient Instructions (Signed)

## 2017-10-27 NOTE — Progress Notes (Signed)
Patient: Janice Donovan Female    DOB: 10-06-1965   52 y.o.   MRN: [id number] Visit Date: 10/27/2017  Today's Provider: Mar Daring, PA-C   Chief Complaint  Patient presents with  . Cough   Subjective:    Cough  This is a new problem. The current episode started in the past 7 days (Started on Friday). The problem has been gradually worsening. The problem occurs constantly. The cough is productive of sputum and productive of brown sputum (green-brown-yellow mucus). Associated symptoms include chills, ear pain ("right ear"), headaches, postnasal drip, a sore throat, shortness of breath and wheezing. Pertinent negatives include no chest pain or fever. The symptoms are aggravated by lying down. Treatments tried: inhaler,Mucinex-D and left over of cough medicine with codeine. The treatment provided no relief.      Allergies  Allergen Reactions  . Erythromycin Swelling  . Bee Venom      Current Outpatient Medications:  .  buPROPion (WELLBUTRIN XL) 150 MG 24 hr tablet, TAKE 1 TABLET BY MOUTH  DAILY, Disp: 90 tablet, Rfl: 3 .  calcium carbonate (CALCIUM 600) 1500 (600 Ca) MG TABS tablet, Take 600 mg of elemental calcium by mouth daily with breakfast., Disp: , Rfl:  .  fexofenadine (ALLEGRA) 180 MG tablet, Take by mouth., Disp: , Rfl:  .  ibuprofen (ADVIL,MOTRIN) 800 MG tablet, Take 800 mg by mouth 3 (three) times daily with meals. # 90 3 refills, Disp: , Rfl:  .  levonorgestrel-ethinyl estradiol (SRONYX) 0.1-20 MG-MCG tablet, Take 1 tablet by mouth daily., Disp: 84 tablet, Rfl: 3 .  montelukast (SINGULAIR) 10 MG tablet, TAKE 1 TABLET BY MOUTH AT  BEDTIME, Disp: 90 tablet, Rfl: 3 .  Multiple Vitamin (MULTI-VITAMINS) TABS, Take by mouth., Disp: , Rfl:  .  nystatin-triamcinolone (MYCOLOG II) cream, Apply 1 application topically 2 (two) times daily as needed., Disp: 60 g, Rfl: 0 .  pantoprazole (PROTONIX) 40 MG tablet, TAKE 1 TABLET BY MOUTH 2  TIMES DAILY BEFORE A MEAL., Disp:  180 tablet, Rfl: 1 .  PROAIR HFA 108 (90 Base) MCG/ACT inhaler, Inhale 2 puffs into the lungs every 4 (four) hours as needed., Disp: 18 g, Rfl: 5 .  senna-docusate (SENOKOT-S) 8.6-50 MG per tablet, Take by mouth., Disp: , Rfl:  .  fluticasone (FLONASE) 50 MCG/ACT nasal spray, USE 2 SPRAYS IN EACH  NOSTRIL DAILY (Patient not taking: Reported on 10/27/2017), Disp: 48 g, Rfl: 1  Review of Systems  Constitutional: Positive for chills. Negative for fever.  HENT: Positive for ear pain ("right ear"), postnasal drip, sinus pressure, sinus pain, sore throat and trouble swallowing. Negative for congestion and sneezing.   Respiratory: Positive for cough, chest tightness, shortness of breath and wheezing.   Cardiovascular: Negative for chest pain, palpitations and leg swelling.  Gastrointestinal: Negative for abdominal pain.  Neurological: Positive for headaches. Negative for dizziness.    Social History   Tobacco Use  . Smoking status: Never Smoker  . Smokeless tobacco: Never Used  Substance Use Topics  . Alcohol use: No    Alcohol/week: 0.0 standard drinks   Objective:   BP 120/80 (BP Location: Left Arm, Patient Position: Sitting, Cuff Size: Normal)   Pulse 80   Temp 98.1 F (36.7 C) (Oral)   Resp 16   Wt 152 lb 12.8 oz (69.3 kg)   SpO2 98%   BMI 24.66 kg/m  Vitals:   10/27/17 1541  BP: 120/80  Pulse: 80  Resp: 16  Temp: 98.1 F (36.7 C)  TempSrc: Oral  SpO2: 98%  Weight: 152 lb 12.8 oz (69.3 kg)     Physical Exam  Constitutional: She appears well-developed and well-nourished. No distress.  HENT:  Head: Normocephalic and atraumatic.  Right Ear: Hearing, tympanic membrane, external ear and ear canal normal.  Left Ear: Hearing, tympanic membrane, external ear and ear canal normal.  Nose: Right sinus exhibits maxillary sinus tenderness and frontal sinus tenderness. Left sinus exhibits maxillary sinus tenderness and frontal sinus tenderness.  Mouth/Throat: Uvula is midline,  oropharynx is clear and moist and mucous membranes are normal. No oropharyngeal exudate.  Neck: Normal range of motion. Neck supple. No tracheal deviation present. No thyromegaly present.  Cardiovascular: Normal rate, regular rhythm and normal heart sounds. Exam reveals no gallop and no friction rub.  No murmur heard. Pulmonary/Chest: Effort normal and breath sounds normal. No stridor. No respiratory distress. She has no wheezes. She has no rales.  Lymphadenopathy:    She has no cervical adenopathy.  Skin: She is not diaphoretic.  Vitals reviewed.      Assessment & Plan:     1. Acute non-recurrent pansinusitis Worsening symptoms that have not responded to OTC medications. Will give augmentin and prednisone as below. Continue allergy medications. Stay well hydrated and get plenty of rest. Call if no symptom improvement or if symptoms worsen. - amoxicillin-clavulanate (AUGMENTIN) 875-125 MG tablet; Take 1 tablet by mouth 2 (two) times daily.  Dispense: 20 tablet; Refill: 0 - predniSONE (STERAPRED UNI-PAK 21 TAB) 10 MG (21) TBPK tablet; 6 day taper; take as directed on package instructions  Dispense: 21 tablet; Refill: 0  2. Antibiotic-induced yeast infection Given prophylactically in case she develops a yeast infection as she has a tendency to with augmentin use.  - fluconazole (DIFLUCAN) 150 MG tablet; Take 1 tablet (150 mg total) by mouth once for 1 dose. May repeat in 48-72 hrs prn  Dispense: 2 tablet; Refill: 0       Mar Daring, PA-C  [facility]

## 2017-10-29 ENCOUNTER — Telehealth: Payer: Self-pay | Admitting: Physician Assistant

## 2017-10-29 ENCOUNTER — Encounter: Payer: Self-pay | Admitting: Physician Assistant

## 2017-10-29 DIAGNOSIS — J4520 Mild intermittent asthma, uncomplicated: Secondary | ICD-10-CM

## 2017-10-29 DIAGNOSIS — J40 Bronchitis, not specified as acute or chronic: Secondary | ICD-10-CM

## 2017-10-29 MED ORDER — FLUTICASONE FUROATE-VILANTEROL 200-25 MCG/INH IN AEPB: 1.0000 | INHALATION_SPRAY | Freq: Every day | RESPIRATORY_TRACT | 0 refills | Status: DC

## 2017-10-29 MED ORDER — PROAIR HFA 108 (90 BASE) MCG/ACT IN AERS: 2.0000 | INHALATION_SPRAY | RESPIRATORY_TRACT | 5 refills | Status: DC | PRN

## 2017-10-29 NOTE — Telephone Encounter (Signed)
Came in on Wednesday and was prescribed an inhaler.  When she went to pick up the inhaler it was ProAir HFA and she is  already using this inhaler.   She wants to know if you meant to send her in something different.  She uses [employer] and [employer]    CB#  [phone]  Thanks [employer]

## 2017-10-29 NOTE — Telephone Encounter (Signed)
Sent in [place]

## 2017-11-17 ENCOUNTER — Other Ambulatory Visit: Payer: Self-pay | Admitting: Certified Nurse Midwife

## 2017-12-13 ENCOUNTER — Other Ambulatory Visit: Payer: Self-pay | Admitting: Certified Nurse Midwife

## 2017-12-13 ENCOUNTER — Other Ambulatory Visit: Payer: Self-pay | Admitting: Physician Assistant

## 2017-12-13 DIAGNOSIS — Z1231 Encounter for screening mammogram for malignant neoplasm of breast: Secondary | ICD-10-CM

## 2017-12-13 DIAGNOSIS — K219 Gastro-esophageal reflux disease without esophagitis: Secondary | ICD-10-CM

## 2017-12-15 ENCOUNTER — Encounter: Payer: Self-pay | Admitting: Physician Assistant

## 2017-12-15 DIAGNOSIS — J0140 Acute pansinusitis, unspecified: Secondary | ICD-10-CM

## 2017-12-15 DIAGNOSIS — T3695XA Adverse effect of unspecified systemic antibiotic, initial encounter: Principal | ICD-10-CM

## 2017-12-15 DIAGNOSIS — B379 Candidiasis, unspecified: Secondary | ICD-10-CM

## 2017-12-15 MED ORDER — FLUCONAZOLE 150 MG PO TABS: 150.0000 mg | ORAL_TABLET | Freq: Once | ORAL | 0 refills | Status: DC

## 2017-12-15 MED ORDER — AMOXICILLIN-POT CLAVULANATE 875-125 MG PO TABS: 1.0000 | ORAL_TABLET | Freq: Two times a day (BID) | ORAL | 0 refills | Status: DC

## 2017-12-16 MED ORDER — AMOXICILLIN-POT CLAVULANATE 875-125 MG PO TABS: 1.0000 | ORAL_TABLET | Freq: Two times a day (BID) | ORAL | 0 refills | Status: DC

## 2017-12-16 MED ORDER — FLUCONAZOLE 150 MG PO TABS: 150.0000 mg | ORAL_TABLET | Freq: Once | ORAL | 0 refills | Status: AC

## 2017-12-16 NOTE — Addendum Note (Signed)
Addended by: Mar Daring on: 12/16/2017 03:27 PM   Modules accepted: Orders

## 2017-12-17 ENCOUNTER — Telehealth: Payer: Self-pay

## 2017-12-17 NOTE — Telephone Encounter (Signed)
[facility] calling for collaborating physician for Janice Donovan, name, NPI #, address, phone and fax.  Ref # [id number]  Ph [phone]  Spoke c pharmacist who only asked for collaborating physician name and NPI #.  Info given on PH.

## 2017-12-21 ENCOUNTER — Other Ambulatory Visit: Payer: Self-pay | Admitting: Certified Nurse Midwife

## 2017-12-21 DIAGNOSIS — N926 Irregular menstruation, unspecified: Secondary | ICD-10-CM

## 2018-01-05 ENCOUNTER — Other Ambulatory Visit: Payer: BLUE CROSS/BLUE SHIELD

## 2018-01-05 DIAGNOSIS — D2262 Melanocytic nevi of left upper limb, including shoulder: Secondary | ICD-10-CM | POA: Diagnosis not present

## 2018-01-05 DIAGNOSIS — N926 Irregular menstruation, unspecified: Secondary | ICD-10-CM | POA: Diagnosis not present

## 2018-01-05 DIAGNOSIS — D2261 Melanocytic nevi of right upper limb, including shoulder: Secondary | ICD-10-CM | POA: Diagnosis not present

## 2018-01-05 DIAGNOSIS — Z1283 Encounter for screening for malignant neoplasm of skin: Secondary | ICD-10-CM | POA: Diagnosis not present

## 2018-01-05 DIAGNOSIS — D225 Melanocytic nevi of trunk: Secondary | ICD-10-CM | POA: Diagnosis not present

## 2018-01-06 LAB — FSH/LH
FSH: 23.1 m[IU]/mL
LH: 11.6 m[IU]/mL

## 2018-01-12 NOTE — Progress Notes (Signed)
/    Gynecology Annual Exam  PCP: Mar Daring, PA-C  Chief Complaint:  Chief Complaint  Patient presents with  . Gynecologic Exam    History of Present Illness:Janice Donovan is a 52 year old Caucasian/White female, G2 P2002, who presents for her annual exam. Her problems with BTB have recurred. She bleeds the week before her placebo week. The bleeding lasts 4 days, are a medium flow and are sometimes with clots. She then does not have a withdrawal bleed on the placebo pills. The BTB did resolve for a short period of time when she was given estrogen to supplement her pills x 2 months. A pelvic ultrasound last year was normal.  She reports mild  dysmenorrhea. She uses ibuprofen 800mg m with symptomatic relief.  The patient's past medical history is notable for a history of asthma, allergies, GERD, constipation, and a mood disorder. She is doing well on her Wellbutrin.  She has a history of having a LEEP 12/06/2008 for a endocervical polyp and path returned HPV. Pap smears since then have been normal. Her last Pap smear 01/11/2017 was NIL/negative HRHPV.  She is sexually active. She uses a generic of Alesse for [facility]. She had a [facility] and LH drawn while on her placebos last month and results were slightly elevated 23.1/11.6.  Her most recent mammogram obtained 01/11/2017 and it was negative. There is no family history of breast cancer.  There is no family history of ovarian cancer.  The patient does occ self breast exams.  She had a colonoscopy in in 2016 that was normal. Her next colonoscopy is due in 10 years.  A DEXA scan is not applicable for this patient.  The patient does not smoke.  The patient does not drink alcohol.  The patient does not use illegal drugs.  The patient exercises occasionally by walking.  The patient gets adequate calcium with her diet and supplements She had a recent cholesterol screen in 2019 that was borderline elevated. She desires a repeat lipid  panel.  .    Review of Systems: Review of Systems  Constitutional: Negative for chills, fever and weight loss.  HENT: Negative for congestion, sinus pain and sore throat.   Eyes: Negative for blurred vision and pain.  Respiratory: Negative for hemoptysis, shortness of breath and wheezing.   Cardiovascular: Negative for chest pain and leg swelling.  Gastrointestinal: Positive for constipation. Negative for abdominal pain, blood in stool, diarrhea, heartburn, nausea and vomiting.  Genitourinary: Negative for dysuria, frequency, hematuria and urgency.  Musculoskeletal: Negative for back pain, joint pain and myalgias.  Skin: Negative for itching and rash.  Neurological: Negative for dizziness, tingling and headaches.  Endo/Heme/Allergies: Negative for environmental allergies and polydipsia. Does not bruise/bleed easily.       Negative for hirsutism   Psychiatric/Behavioral: Negative for depression. The patient is not nervous/anxious and does not have insomnia.     Past Medical History:  Past Medical History:  Diagnosis Date  . Adrenal nodule ([place])   . Allergic rhinitis   . Asthma   . Cervical polyp   . Constipation   . Generalized abdominal pain   . GERD (gastroesophageal reflux disease)   . History of mammogram 2015;01/02/15; 01/06/16   birads 1; birads 1; neg  . History of Papanicolaou smear of cervix 2011;03/10/15   neg; neg  . IBS (irritable bowel syndrome)   . Mood disorder ([place])   . Obesity   . Plantar fasciitis of left foot 09/29/2012  .  Renal lesion   . Vaginal birth after cesarean 1993    Past Surgical History:  Past Surgical History:  Procedure Laterality Date  . ANTERIOR CERVICAL DECOMP/DISCECTOMY FUSION  2005   C4-6  . CESAREAN SECTION  1990  . colonoscopy with polypectomy  06/2007; 2016   abd pain and chronic coinstipation  . DIAGNOSTIC LAPAROSCOPY     normal pelvis  . ESOPHAGOGASTRODUODENOSCOPY  06/2007; 2016   gastric polyps  . LEEP  12/06/2008   cx  polyp removal-path was HPV changes - [facility]    Family History:  Family History  Problem Relation Age of Onset  . Diabetes Father   . Hypertension Father   . Benign prostatic hyperplasia Father   . Bladder Cancer Father   . Hyperlipidemia Father   . Dementia Father        Lewy Body Dementia  . Asthma Father   . Melanoma Father   . COPD Mother   . Heart disease Mother   . Stroke Mother   . Hypertension Mother   . Diabetes Sister   . Hyperlipidemia Brother   . Hypertension Brother   . Cancer Maternal Grandmother 31       pancreatic  . Prostate cancer Neg Hx   . Kidney disease Neg Hx     Social History:  Social History   Socioeconomic History  . Marital status: Married    Spouse name: Not on file  . Number of children: 2  . Years of education: H/S  . Highest education level: Not on file  Occupational History  . Occupation: Programme researcher, broadcasting/film/video: [employer]  Social Needs  . Financial resource strain: Not on file  . Food insecurity:    Worry: Not on file    Inability: Not on file  . Transportation needs:    Medical: Not on file    Non-medical: Not on file  Tobacco Use  . Smoking status: Never Smoker  . Smokeless tobacco: Never Used  Substance and Sexual Activity  . Alcohol use: No    Alcohol/week: 0.0 standard drinks  . Drug use: No  . Sexual activity: Yes    Partners: Male    Birth control/protection: Pill  Lifestyle  . Physical activity:    Days per week: 0 days    Minutes per session: 0 min  . Stress: Not at all  Relationships  . Social connections:    Talks on phone: More than three times a week    Gets together: Three times a week    Attends religious service: More than 4 times per year    Active member of club or organization: No    Attends meetings of clubs or organizations: Never    Relationship status: Married  . Intimate partner violence:    Fear of current or ex partner: No    Emotionally abused: No    Physically abused: No    Forced sexual  activity: No  Other Topics Concern  . Not on file  Social History Narrative   Issabelle's daughter Apolonio Schneiders has 3 children -ages 41,2,and 62(2018). Her son is a Education officer, museum.    Allergies:  Allergies  Allergen Reactions  . Erythromycin Swelling  . Bee Venom     Medications:  Current Outpatient Medications:  .  buPROPion (WELLBUTRIN XL) 150 MG 24 hr tablet, TAKE 1 TABLET BY MOUTH  DAILY, Disp: 90 tablet, Rfl: 3 .  calcium carbonate (CALCIUM 600) 1500 (600 Ca) MG TABS tablet, Take 600  mg of elemental calcium by mouth daily with breakfast., Disp: , Rfl:  .  Cholecalciferol (VITAMIN D-1000 MAX ST) 25 MCG (1000 UT) tablet, Take by mouth., Disp: , Rfl:  .  fexofenadine (ALLEGRA) 180 MG tablet, Take by mouth., Disp: , Rfl:  .  fluticasone (FLONASE) 50 MCG/ACT nasal spray, USE 2 SPRAYS IN EACH  NOSTRIL DAILY, Disp: 48 g, Rfl: 1 .  fluticasone furoate-vilanterol (BREO ELLIPTA) 200-25 MCG/INH AEPB, Inhale 1 puff into the lungs daily., Disp: 28 each, Rfl: 0 .  ibuprofen (ADVIL,MOTRIN) 800 MG tablet, Take 800 mg by mouth 3 (three) times daily with meals. # 90 3 refills, Disp: , Rfl:  .  montelukast (SINGULAIR) 10 MG tablet, TAKE 1 TABLET BY MOUTH AT  BEDTIME, Disp: 90 tablet, Rfl: 3 .  Multiple Vitamin (MULTI-VITAMINS) TABS, Take by mouth., Disp: , Rfl:  .  nystatin-triamcinolone (MYCOLOG II) cream, Apply 1 application topically 2 (two) times daily as needed., Disp: 60 g, Rfl: 0 .  pantoprazole (PROTONIX) 40 MG tablet, TAKE 1 TABLET BY MOUTH 2  TIMES DAILY BEFORE A MEAL., Disp: 180 tablet, Rfl: 1 .  PROAIR HFA 108 (90 Base) MCG/ACT inhaler, Inhale 2 puffs into the lungs every 4 (four) hours as needed., Disp: 18 g, Rfl: 5 .  senna-docusate (SENOKOT-S) 8.6-50 MG per tablet, Take by mouth., Disp: , Rfl:  .  SRONYX 0.1-20 MG-MCG tablet, TAKE 1 TABLET BY MOUTH  DAILY, Disp: 84 tablet, Rfl: 3 Physical Exam Vitals: BP 98/60   Pulse 78   Ht 5\' 6"  (1.676 m)   Wt 152 lb (68.9 kg)   LMP 12/27/2017  (Exact Date)   BMI 24.53 kg/m   General: WF in NAD HEENT: normocephalic, anicteric Neck: no thyroid enlargement, no palpable nodules, no cervical lymphadenopathy  Pulmonary: No increased work of breathing, CTAB Cardiovascular: RRR  Breast: Breast symmetrical, no tenderness, no palpable nodules or masses, no skin or nipple retraction present, no nipple discharge.  No axillary, infraclavicular or supraclavicular lymphadenopathy. Abdomen: Soft, non-tender, non-distended.  Umbilicus without lesions.  No hepatomegaly or masses palpable. No evidence of hernia. Genitourinary:  External: Normal external female genitalia.  Normal urethral meatus, normal Bartholin's and Skene's glands.    Vagina: Normal vaginal mucosa, no evidence of prolapse.    Cervix: Grossly normal in appearance, no bleeding, non-tender  Uterus: Anteverted, globular and irregular fundus, mobile, and non-tender  Adnexa: No adnexal masses, non-tender  Rectal: deferred  Lymphatic: no evidence of inguinal lymphadenopathy Extremities: no edema, erythema, or tenderness Neurologic: Grossly intact Psychiatric: mood appropriate, affect full     Assessment: 52 y.o. [id number] annual gyn exam BTB on OCPS. Perimenopausal  Plan: Recommended stopping pills for at least 1 month and getting an [facility] and LH when she has been off the pills x 1 month Encouraged use of condoms when off OCPs.    1) Breast cancer screening - recommend monthly self breast exam and annual screening mammograms. Mammogram is scheduled for today at 1400.  2) Colon cancer screen: colonoscopy 2016 is UTD.  3) Cervical cancer screening - Pap was done.   4) Contraception -stop OCps for now. Needs to use condoms until no menses x 1 year   5) Routine healthcare maintenance including cholesterol and diabetes screening managed by PCP, but she desires repeat lipid panel today when fasting.  6) RTO in 3 months and prn  Dalia Heading, CNM

## 2018-01-13 ENCOUNTER — Encounter: Payer: Self-pay | Admitting: Certified Nurse Midwife

## 2018-01-13 ENCOUNTER — Other Ambulatory Visit: Payer: Self-pay | Admitting: Certified Nurse Midwife

## 2018-01-13 ENCOUNTER — Ambulatory Visit (INDEPENDENT_AMBULATORY_CARE_PROVIDER_SITE_OTHER): Payer: BLUE CROSS/BLUE SHIELD | Admitting: Certified Nurse Midwife

## 2018-01-13 ENCOUNTER — Ambulatory Visit
Admission: RE | Admit: 2018-01-13 | Discharge: 2018-01-13 | Disposition: A | Discharge status: Home / Self Care | Payer: BLUE CROSS/BLUE SHIELD | Source: Ambulatory Visit | Attending: Certified Nurse Midwife | Admitting: Certified Nurse Midwife

## 2018-01-13 VITALS — BP 98/60 | HR 78 | Ht 66.0 in | Wt 152.0 lb

## 2018-01-13 DIAGNOSIS — R928 Other abnormal and inconclusive findings on diagnostic imaging of breast: Secondary | ICD-10-CM

## 2018-01-13 DIAGNOSIS — Z1231 Encounter for screening mammogram for malignant neoplasm of breast: Secondary | ICD-10-CM | POA: Diagnosis not present

## 2018-01-13 DIAGNOSIS — N921 Excessive and frequent menstruation with irregular cycle: Secondary | ICD-10-CM

## 2018-01-13 DIAGNOSIS — Z124 Encounter for screening for malignant neoplasm of cervix: Secondary | ICD-10-CM

## 2018-01-13 DIAGNOSIS — E785 Hyperlipidemia, unspecified: Secondary | ICD-10-CM | POA: Diagnosis not present

## 2018-01-13 DIAGNOSIS — Z01419 Encounter for gynecological examination (general) (routine) without abnormal findings: Secondary | ICD-10-CM

## 2018-01-14 LAB — LIPID PANEL
CHOL/HDL RATIO: 2.9 ratio (ref 0.0–4.4)
Cholesterol, Total: 245 mg/dL — ABNORMAL HIGH (ref 100–199)
HDL: 84 mg/dL (ref >39)
LDL CALC: 141 mg/dL — AB (ref 0–99)
Triglycerides: 100 mg/dL (ref 0–149)
VLDL Cholesterol Cal: 20 mg/dL (ref 5–40)

## 2018-01-16 LAB — IGP,RFX APTIMA HPV ALL PTH

## 2018-01-18 ENCOUNTER — Encounter: Payer: Self-pay | Admitting: Certified Nurse Midwife

## 2018-01-27 ENCOUNTER — Ambulatory Visit
Admission: RE | Admit: 2018-01-27 | Discharge: 2018-01-27 | Disposition: A | Discharge status: Home / Self Care | Payer: BLUE CROSS/BLUE SHIELD | Source: Ambulatory Visit | Attending: Certified Nurse Midwife | Admitting: Certified Nurse Midwife

## 2018-01-27 DIAGNOSIS — R928 Other abnormal and inconclusive findings on diagnostic imaging of breast: Secondary | ICD-10-CM

## 2018-02-22 ENCOUNTER — Other Ambulatory Visit: Payer: Self-pay | Admitting: Physician Assistant

## 2018-02-22 DIAGNOSIS — F325 Major depressive disorder, single episode, in full remission: Secondary | ICD-10-CM

## 2018-02-22 DIAGNOSIS — J3089 Other allergic rhinitis: Secondary | ICD-10-CM

## 2018-03-16 DIAGNOSIS — L821 Other seborrheic keratosis: Secondary | ICD-10-CM | POA: Diagnosis not present

## 2018-03-16 DIAGNOSIS — D485 Neoplasm of uncertain behavior of skin: Secondary | ICD-10-CM | POA: Diagnosis not present

## 2018-03-16 DIAGNOSIS — L820 Inflamed seborrheic keratosis: Secondary | ICD-10-CM | POA: Diagnosis not present

## 2018-03-16 DIAGNOSIS — L538 Other specified erythematous conditions: Secondary | ICD-10-CM | POA: Diagnosis not present

## 2018-03-16 DIAGNOSIS — L298 Other pruritus: Secondary | ICD-10-CM | POA: Diagnosis not present

## 2018-04-14 ENCOUNTER — Ambulatory Visit (INDEPENDENT_AMBULATORY_CARE_PROVIDER_SITE_OTHER): Payer: 59 | Admitting: Certified Nurse Midwife

## 2018-04-14 ENCOUNTER — Encounter: Payer: Self-pay | Admitting: Certified Nurse Midwife

## 2018-04-14 VITALS — BP 110/64 | HR 70 | Ht 66.0 in | Wt 153.0 lb

## 2018-04-14 DIAGNOSIS — Z23 Encounter for immunization: Secondary | ICD-10-CM

## 2018-04-14 DIAGNOSIS — N926 Irregular menstruation, unspecified: Secondary | ICD-10-CM | POA: Diagnosis not present

## 2018-04-14 DIAGNOSIS — R232 Flushing: Secondary | ICD-10-CM | POA: Diagnosis not present

## 2018-04-14 DIAGNOSIS — N898 Other specified noninflammatory disorders of vagina: Secondary | ICD-10-CM | POA: Diagnosis not present

## 2018-04-14 MED ORDER — TETANUS-DIPHTH-ACELL PERTUSSIS 5-2.5-18.5 LF-MCG/0.5 IM SUSP
0.5000 mL | Freq: Once | INTRAMUSCULAR | Status: AC
  Administered 2018-04-14: 0.5 mL via INTRAMUSCULAR

## 2018-04-14 NOTE — Progress Notes (Signed)
History of Present Illness:  Janice Donovan is a 53 y.o.  Caucasian/White female, G2 P2002, who presented for her annual exam with complaints of BTB on her OCP.Marland Kitchen She was bleeding the week before her placebo week. The bleeding lasted 4 days and she had a medium flow sometimes hadclots. She then would not have a withdrawal bleed on the placebo pills. The BTB did resolve for a short period of time when she was given estrogen to supplement her pills x 2 months. A pelvic ultrasound last year was normal. She had a [place] and LH drawn while on her placebos November 2019 and results were slightly elevated 23.1/11.6. SHe stopped her pills 02/05/2018 and had a withdrawal bleed and has had no bleeding since then. She has had increased hot flashes, usually waking her in the early AM. She also reports some vaginal dryness. She would like to repeat her [place] and LH today.  Her current medications are listed below. Current Outpatient Medications on File Prior to Visit  Medication Sig Dispense Refill  . buPROPion (WELLBUTRIN XL) 150 MG 24 hr tablet TAKE 1 TABLET BY MOUTH  DAILY 90 tablet 3  . calcium carbonate (CALCIUM 600) 1500 (600 Ca) MG TABS tablet Take 600 mg of elemental calcium by mouth daily with breakfast.    . Cholecalciferol (VITAMIN D-1000 MAX ST) 25 MCG (1000 UT) tablet Take by mouth.    . fexofenadine (ALLEGRA) 180 MG tablet Take by mouth.    . fluticasone (FLONASE) 50 MCG/ACT nasal spray USE 2 SPRAYS IN EACH  NOSTRIL DAILY 48 g 1  . montelukast (SINGULAIR) 10 MG tablet TAKE 1 TABLET BY MOUTH AT  BEDTIME 90 tablet 3  . Multiple Vitamin (MULTI-VITAMINS) TABS Take by mouth.    . pantoprazole (PROTONIX) 40 MG tablet TAKE 1 TABLET BY MOUTH 2  TIMES DAILY BEFORE A MEAL. 180 tablet 1  . senna-docusate (SENOKOT-S) 8.6-50 MG per tablet Take by mouth.    . fluticasone furoate-vilanterol (BREO ELLIPTA) 200-25 MCG/INH AEPB Inhale 1 puff into the lungs daily. (Patient not taking: Reported on 04/14/2018) 28 each 0  .  ibuprofen (ADVIL,MOTRIN) 800 MG tablet Take 800 mg by mouth 3 (three) times daily with meals. # 90 3 refills    . nystatin-triamcinolone (MYCOLOG II) cream Apply 1 application topically 2 (two) times daily as needed. (Patient not taking: Reported on 04/14/2018) 60 g 0  . PROAIR HFA 108 (90 Base) MCG/ACT inhaler Inhale 2 puffs into the lungs every 4 (four) hours as needed. (Patient not taking: Reported on 04/14/2018) 18 g 5  . SRONYX 0.1-20 MG-MCG tablet TAKE 1 TABLET BY MOUTH  DAILY (Patient not taking: Reported on 04/14/2018) 84 tablet 3   No current facility-administered medications on file prior to visit.      PMHx: She  has a past medical history of Adrenal nodule ([place]), Allergic rhinitis, Asthma, Cervical polyp, Constipation, Generalized abdominal pain, GERD (gastroesophageal reflux disease), History of mammogram (2015;01/02/15; 01/06/16), History of Papanicolaou smear of cervix (2011;03/10/15), IBS (irritable bowel syndrome), Mood disorder ([place]), Obesity, Plantar fasciitis of left foot (09/29/2012), Renal lesion, and Vaginal birth after cesarean (1993). Also,  has a past surgical history that includes colonoscopy with polypectomy (06/2007; 2016); Cesarean section (1990); Anterior cervical decomp/discectomy fusion (2005); Diagnostic laparoscopy; Esophagogastroduodenoscopy (06/2007; 2016); and LEEP (12/06/2008)., family history includes Asthma in her father; Benign prostatic hyperplasia in her father; Bladder Cancer in her father; COPD in her mother; Cancer (age of onset: 83) in her maternal grandmother; Dementia in  her father; Diabetes in her father and sister; Heart disease in her mother; Hyperlipidemia in her brother and father; Hypertension in her brother, father, and mother; Melanoma in her father; Stroke in her mother.,  reports that she has never smoked. She has never used smokeless tobacco. She reports that she does not drink alcohol or use drugs.  Allergies: Erythromycin and Bee  venom ROS  Physical Exam:  BP 110/64   Pulse 70   Ht 5\' 6"  (1.676 m)   Wt 153 lb (69.4 kg)   BMI 24.69 kg/m  Body mass index is 24.69 kg/m. Constitutional: Well nourished, well developed female in no acute distress.   Neuro: Grossly intact Psych:  Normal mood and affect.    Assessment: Perimenopausal symptoms  Plan: FSH/ LH today Discussed treatments for vasomotor symptoms ( estrogen, [place], [place], vi (Replens), tamin E, Remifemin) and vaginal dryness (topical vaginal estrogen, [place], moisturizers, Osphena) Wants to try OTC treatments Will call with results Desires TDAP (last tetanus about 10 years  ago)   Dalia Heading, [facility], [facility] 04/14/2018  5:30 PM

## 2018-04-15 LAB — FSH/LH
FSH: 36.9 m[IU]/mL
LH: 35.9 m[IU]/mL

## 2018-05-19 ENCOUNTER — Other Ambulatory Visit: Payer: Self-pay | Admitting: Physician Assistant

## 2018-05-19 DIAGNOSIS — K219 Gastro-esophageal reflux disease without esophagitis: Secondary | ICD-10-CM

## 2018-08-04 ENCOUNTER — Other Ambulatory Visit: Payer: Self-pay

## 2018-08-04 ENCOUNTER — Ambulatory Visit (INDEPENDENT_AMBULATORY_CARE_PROVIDER_SITE_OTHER): Payer: 59 | Admitting: Podiatry

## 2018-08-04 ENCOUNTER — Encounter: Payer: Self-pay | Admitting: Podiatry

## 2018-08-04 DIAGNOSIS — Q828 Other specified congenital malformations of skin: Secondary | ICD-10-CM | POA: Insufficient documentation

## 2018-08-04 NOTE — Progress Notes (Signed)
This patient presents the office with chief complaint of a painful callus on her right forefoot.  She has been seen for this problem approximately 1 year ago by Dr. Amalia Hailey.  She says the callus has become painful and sore walking and wearing her shoes.  She presents the office today for preventative foot care services.  Vascular  Dorsalis pedis and posterior tibial pulses are palpable  B/L.  Capillary return  WNL.  Temperature gradient is  WNL.  Skin turgor  WNL  Sensorium  Senn Weinstein monofilament wire  WNL. Normal tactile sensation.  Nail Exam  Patient has normal nails with no evidence of bacterial or fungal infection.  Orthopedic  Exam  Muscle tone and muscle strength  WNL.  No limitations of motion feet  B/L.  No crepitus or joint effusion noted.  Foot type is unremarkable and digits show no abnormalities.  Bony prominences are unremarkable.  Skin  No open lesions.  Normal skin texture and turgor. Porokeratosis sub 4 left foot.  Porokeratosis right forefoot.  Debride porokeratosis.  Recommended soft orthoses to help cushion her right forefoot.   Gardiner Barefoot DPM

## 2018-08-31 ENCOUNTER — Telehealth: Payer: Self-pay

## 2018-08-31 ENCOUNTER — Other Ambulatory Visit: Payer: Self-pay | Admitting: Certified Nurse Midwife

## 2018-08-31 DIAGNOSIS — N920 Excessive and frequent menstruation with regular cycle: Secondary | ICD-10-CM

## 2018-08-31 NOTE — Telephone Encounter (Signed)
Please advise. Thank you

## 2018-08-31 NOTE — Telephone Encounter (Signed)
CAlled patient. Stopped pills 12/06/17 and no menses until April. Now having heavy periods every 24 days lasting 7-8 days with clots. Currently on day 8 of bleeding with this cycle. Will get pelvic ultrasound and follow up with me.

## 2018-08-31 NOTE — Telephone Encounter (Signed)
That should read-stopped pills 02/05/2018

## 2018-08-31 NOTE — Telephone Encounter (Signed)
Pt calling; CLG had her to stop bc in Dec; no period until April; since April has been bleeding heavy every 17d; is now on day 8 of this cycle which is why she is calling.  [phone]

## 2018-09-07 ENCOUNTER — Other Ambulatory Visit (HOSPITAL_COMMUNITY)
Admission: RE | Admit: 2018-09-07 | Discharge: 2018-09-07 | Disposition: A | Discharge status: Home / Self Care | Payer: 59 | Source: Ambulatory Visit | Attending: Certified Nurse Midwife | Admitting: Certified Nurse Midwife

## 2018-09-07 ENCOUNTER — Encounter: Payer: Self-pay | Admitting: Certified Nurse Midwife

## 2018-09-07 ENCOUNTER — Other Ambulatory Visit: Payer: Self-pay

## 2018-09-07 ENCOUNTER — Ambulatory Visit (INDEPENDENT_AMBULATORY_CARE_PROVIDER_SITE_OTHER): Payer: 59 | Admitting: Certified Nurse Midwife

## 2018-09-07 ENCOUNTER — Ambulatory Visit (INDEPENDENT_AMBULATORY_CARE_PROVIDER_SITE_OTHER): Payer: 59

## 2018-09-07 VITALS — BP 110/66 | HR 64 | Ht 66.0 in | Wt 155.0 lb

## 2018-09-07 DIAGNOSIS — N83202 Unspecified ovarian cyst, left side: Secondary | ICD-10-CM

## 2018-09-07 DIAGNOSIS — N920 Excessive and frequent menstruation with regular cycle: Secondary | ICD-10-CM | POA: Insufficient documentation

## 2018-09-07 DIAGNOSIS — N840 Polyp of corpus uteri: Secondary | ICD-10-CM

## 2018-09-07 DIAGNOSIS — N921 Excessive and frequent menstruation with irregular cycle: Secondary | ICD-10-CM

## 2018-09-07 DIAGNOSIS — N939 Abnormal uterine and vaginal bleeding, unspecified: Secondary | ICD-10-CM | POA: Diagnosis not present

## 2018-09-13 NOTE — Progress Notes (Signed)
Obstetrics & Gynecology Office Visit   Chief Complaint:  Chief Complaint  Patient presents with  . Abnormal bleeding    History of Present Illness:  Janice Donovan is a 53 y.o.  Caucasian/White female, G2 P2002, who presents for a follow up on abnormal uterine bleeding. Last year, she was bleeding the week before her placebo week. The bleeding lasted 4 days and she had a medium flow sometimes had clots. She then would not have a withdrawal bleed on the placebo pills. The BTB did resolve for a short period of time when she was given estrogen to supplement her pills x 2 months. Stopped pills 12/06/17 and no menses until April. Now having heavy periods every 23-37 days lasting 7-11 days with clots. Her LMP started 14 July and lasted until 25 July.  Prior menstrual periods were 17-23 May and 22-27 June.  Currently using condoms for contraception. She had a pelvic ultrasound today which showed the following:  The uterus is retroverted and measures 8.5 x 4.9 x 3.8 cm. The uterus has numerous 3 mm cysts. Adenomyosis is possible.  Echo texture is heterogenous without evidence of focal masses. The Endometrium measures 8.6 mm. There are a few 3 mm cysts in the endometrium as well.   Right Ovary Is not visualized.  Left Ovary measures 4.5 x 3.3 x 3.2 cm. It is not normal in appearance. There is a complex cyst in the left ovary with anechoic fluid and septations. No blood flow is seen within. This cyst measures 3.7 x 3.0 x 2.4 cm.  Survey of the adnexa demonstrates no adnexal masses. There is no free fluid in the cul de sac.  Review of Systems:  ROS -see HPI  Past Medical History:  Past Medical History:  Diagnosis Date  . Adrenal nodule ([place])   . Allergic rhinitis   . Asthma   . Cervical polyp   . Constipation   . Generalized abdominal pain   . GERD (gastroesophageal reflux disease)   . History of mammogram 2015;01/02/15; 01/06/16   birads 1; birads 1; neg  . History of Papanicolaou  smear of cervix 2011;03/10/15   neg; neg  . IBS (irritable bowel syndrome)   . Mood disorder ([place])   . Obesity   . Plantar fasciitis of left foot 09/29/2012  . Renal lesion   . Vaginal birth after cesarean 1993    Past Surgical History:  Past Surgical History:  Procedure Laterality Date  . ANTERIOR CERVICAL DECOMP/DISCECTOMY FUSION  2005   C4-6  . CESAREAN SECTION  1990  . colonoscopy with polypectomy  06/2007; 2016   abd pain and chronic coinstipation  . DIAGNOSTIC LAPAROSCOPY     normal pelvis  . ESOPHAGOGASTRODUODENOSCOPY  06/2007; 2016   gastric polyps  . LEEP  12/06/2008   cx polyp removal-path was HPV changes - [facility]    Gynecologic History: Patient's last menstrual period was 08/23/2018 (exact date).  Obstetric History: [id number]  Family History:  Family History  Problem Relation Age of Onset  . Diabetes Father   . Hypertension Father   . Benign prostatic hyperplasia Father   . Bladder Cancer Father   . Hyperlipidemia Father   . Dementia Father        Lewy Body Dementia  . Asthma Father   . Melanoma Father   . COPD Mother   . Heart disease Mother   . Stroke Mother   . Hypertension Mother   . Diabetes Sister   . Hyperlipidemia  Brother   . Hypertension Brother   . Cancer Maternal Grandmother 48       pancreatic  . Prostate cancer Neg Hx   . Kidney disease Neg Hx     Social History:  Social History   Socioeconomic History  . Marital status: Married    Spouse name: Not on file  . Number of children: 2  . Years of education: H/S  . Highest education level: Not on file  Occupational History  . Occupation: Programme researcher, broadcasting/film/video: [employer]  Social Needs  . Financial resource strain: Not on file  . Food insecurity    Worry: Not on file    Inability: Not on file  . Transportation needs    Medical: Not on file    Non-medical: Not on file  Tobacco Use  . Smoking status: Never Smoker  . Smokeless tobacco: Never Used  Substance and Sexual Activity  .  Alcohol use: No    Alcohol/week: 0.0 standard drinks  . Drug use: No  . Sexual activity: Yes    Partners: Male    Birth control/protection: None  Lifestyle  . Physical activity    Days per week: 0 days    Minutes per session: 0 min  . Stress: Not at all  Relationships  . Social connections    Talks on phone: More than three times a week    Gets together: Three times a week    Attends religious service: More than 4 times per year    Active member of club or organization: No    Attends meetings of clubs or organizations: Never    Relationship status: Married  . Intimate partner violence    Fear of current or ex partner: No    Emotionally abused: No    Physically abused: No    Forced sexual activity: No  Other Topics Concern  . Not on file  Social History Narrative   Marna's daughter Apolonio Schneiders has 3 children -ages 30,2,and 43(2018). Her son is a Education officer, museum.    Allergies:  Allergies  Allergen Reactions  . Erythromycin Swelling  . Bee Venom     Medications: Prior to Admission medications   Medication Sig Start Date End Date Taking? Authorizing Provider  buPROPion (WELLBUTRIN XL) 150 MG 24 hr tablet TAKE 1 TABLET BY MOUTH  DAILY 02/23/18  Yes Mar Daring, PA-C  calcium carbonate (CALCIUM 600) 1500 (600 Ca) MG TABS tablet Take 600 mg of elemental calcium by mouth daily with breakfast.   Yes [provider]  Cholecalciferol (VITAMIN D-1000 MAX ST) 25 MCG (1000 UT) tablet Take by mouth.   Yes [provider]  fexofenadine (ALLEGRA) 180 MG tablet Take by mouth.   Yes [provider]  fluticasone (FLONASE) 50 MCG/ACT nasal spray USE 2 SPRAYS IN EACH  NOSTRIL DAILY 10/07/16  Yes Fenton Malling M, PA-C  fluticasone furoate-vilanterol (BREO ELLIPTA) 200-25 MCG/INH AEPB Inhale 1 puff into the lungs daily. 10/29/17  Yes Mar Daring, PA-C  ibuprofen (ADVIL,MOTRIN) 800 MG tablet Take 800 mg by mouth 3 (three) times daily with meals.  # 90 3 refills   Yes [provider]  montelukast (SINGULAIR) 10 MG tablet  06/17/18  Yes [provider]  Multiple Vitamin (MULTI-VITAMINS) TABS Take by mouth.   Yes [provider]  nystatin-triamcinolone (MYCOLOG II) cream Apply 1 application topically 2 (two) times daily as needed. 07/02/17  Yes Mar Daring, PA-C  pantoprazole (PROTONIX) 40 MG tablet  07/01/18  Yes [provider]  PROAIR HFA 108 (90 Base) MCG/ACT inhaler Inhale 2 puffs into the lungs every 4 (four) hours as needed. 10/29/17  Yes Burnette, Clearnce Sorrel, PA-C  senna-docusate (SENOKOT-S) 8.6-50 MG per tablet Take by mouth.   Yes [provider]     Physical Exam Vitals:BP 110/66   Pulse 64   Ht 5\' 6"  (1.676 m)   Wt 155 lb (70.3 kg)   LMP 08/23/2018 (Exact Date)   BMI 25.02 kg/m    Physical Exam  Constitutional: She is oriented to person, place, and time. She appears well-developed and well-nourished. No distress.  Cardiovascular: Normal rate.  Respiratory: Effort normal.  Neurological: She is alert and oriented to person, place, and time.  Psychiatric: She has a normal mood and affect.     Assessment: 53 y.o. [id number] with menorrhagia Left 3.7cm complex cyst  Plan:Discussed findings of the pelvic ultrasound. Recommend endometrial biopsy and repeat pelvic ultrasound in 6-8 weeks. See the procedure note below  Dalia Heading, CNM    Endometrial Biopsy After discussion with the patient regarding her abnormal uterine bleeding I recommended that she proceed with an endometrial biopsy for further diagnosis. The risks, benefits, alternatives, and indications for an endometrial biopsy were discussed with the patient in detail. She understood the risks including infection, bleeding, cervical laceration and uterine perforation.  Written consent was obtained.   PROCEDURE NOTE:  [place] endometrial biopsy was performed using aseptic technique with iodine preparation.  Hurricaine anesthetic was sprayed on the cervix prior to grasping the cervix on the anterior lip with a tenaculum.This was a difficult procedure due to a very anterior cervix. The uterus was sounded to a length of 6 cm.  Adequate sampling was obtained with minimal blood loss. The sample had light brown serous fluid in it.   The patient tolerated the procedure well.  Disposition will be pending pathology. Post biopsy instructions given.  Dalia Heading, CNM   .

## 2018-09-17 DIAGNOSIS — N939 Abnormal uterine and vaginal bleeding, unspecified: Secondary | ICD-10-CM | POA: Insufficient documentation

## 2018-09-17 NOTE — Addendum Note (Signed)
Addended by: Dalia Heading on: 09/17/2018 08:13 PM   Modules accepted: Orders

## 2018-09-22 ENCOUNTER — Encounter: Payer: Self-pay | Admitting: Obstetrics & Gynecology

## 2018-09-22 ENCOUNTER — Ambulatory Visit (INDEPENDENT_AMBULATORY_CARE_PROVIDER_SITE_OTHER): Payer: 59 | Admitting: Obstetrics & Gynecology

## 2018-09-22 ENCOUNTER — Other Ambulatory Visit: Payer: Self-pay

## 2018-09-22 VITALS — BP 120/80 | Ht 66.0 in | Wt 158.0 lb

## 2018-09-22 DIAGNOSIS — N840 Polyp of corpus uteri: Secondary | ICD-10-CM | POA: Insufficient documentation

## 2018-09-22 DIAGNOSIS — N939 Abnormal uterine and vaginal bleeding, unspecified: Secondary | ICD-10-CM | POA: Diagnosis not present

## 2018-09-22 NOTE — H&P (View-Only) (Signed)
PRE-OPERATIVE HISTORY AND PHYSICAL EXAM  HPI:  Janice Donovan is a 53 y.o. [id number] Patient's last menstrual period was 08/23/2018 (exact date).; she is being admitted for surgery related to abnormal uterine bleeding.   She had gone a few months off of BCP w no periods, then restarted 3 mos ago w also BTB that is frequent and heavy.  US showed thickening and polyp.  Also ovarian cyst.  EMB done as well, showing non-cancerous polyp.  PMHx: Past Medical History:  Diagnosis Date  . Adrenal nodule ([place])   . Allergic rhinitis   . Asthma   . Cervical polyp   . Constipation   . Generalized abdominal pain   . GERD (gastroesophageal reflux disease)   . History of mammogram 2015;01/02/15; 01/06/16   birads 1; birads 1; neg  . History of Papanicolaou smear of cervix 2011;03/10/15   neg; neg  . IBS (irritable bowel syndrome)   . Mood disorder ([place])   . Obesity   . Plantar fasciitis of left foot 09/29/2012  . Renal lesion   . Vaginal birth after cesarean 1993   Past Surgical History:  Procedure Laterality Date  . ANTERIOR CERVICAL DECOMP/DISCECTOMY FUSION  2005   C4-6  . CESAREAN SECTION  1990  . colonoscopy with polypectomy  06/2007; 2016   abd pain and chronic coinstipation  . DIAGNOSTIC LAPAROSCOPY     normal pelvis  . ESOPHAGOGASTRODUODENOSCOPY  06/2007; 2016   gastric polyps  . LEEP  12/06/2008   cx polyp removal-path was HPV changes - [facility]   Family History  Problem Relation Age of Onset  . Diabetes Father   . Hypertension Father   . Benign prostatic hyperplasia Father   . Bladder Cancer Father   . Hyperlipidemia Father   . Dementia Father        Lewy Body Dementia  . Asthma Father   . Melanoma Father   . COPD Mother   . Heart disease Mother   . Stroke Mother   . Hypertension Mother   . Diabetes Sister   . Hyperlipidemia Brother   . Hypertension Brother   . Cancer Maternal Grandmother 88       pancreatic  . Prostate cancer Neg Hx   . Kidney disease Neg Hx     Social History   Tobacco Use  . Smoking status: Never Smoker  . Smokeless tobacco: Never Used  Substance Use Topics  . Alcohol use: No    Alcohol/week: 0.0 standard drinks  . Drug use: No    Current Outpatient Medications:  .  buPROPion (WELLBUTRIN XL) 150 MG 24 hr tablet, TAKE 1 TABLET BY MOUTH  DAILY, Disp: 90 tablet, Rfl: 3 .  calcium carbonate (CALCIUM 600) 1500 (600 Ca) MG TABS tablet, Take 600 mg of elemental calcium by mouth daily with breakfast., Disp: , Rfl:  .  Cholecalciferol (VITAMIN D-1000 MAX ST) 25 MCG (1000 UT) tablet, Take by mouth., Disp: , Rfl:  .  fexofenadine (ALLEGRA) 180 MG tablet, Take by mouth., Disp: , Rfl:  .  fluticasone (FLONASE) 50 MCG/ACT nasal spray, USE 2 SPRAYS IN EACH  NOSTRIL DAILY, Disp: 48 g, Rfl: 1 .  montelukast (SINGULAIR) 10 MG tablet, , Disp: , Rfl:  .  Multiple Vitamin (MULTI-VITAMINS) TABS, Take by mouth., Disp: , Rfl:  .  nystatin-triamcinolone (MYCOLOG II) cream, Apply 1 application topically 2 (two) times daily as needed., Disp: 60 g, Rfl: 0 .  pantoprazole (PROTONIX) 40 MG tablet, , Disp: ,  Rfl:  .  PROAIR HFA 108 (90 Base) MCG/ACT inhaler, Inhale 2 puffs into the lungs every 4 (four) hours as needed., Disp: 18 g, Rfl: 5 .  senna-docusate (SENOKOT-S) 8.6-50 MG per tablet, Take by mouth., Disp: , Rfl:  .  UNABLE TO FIND, , Disp: , Rfl:  .  fluticasone furoate-vilanterol (BREO ELLIPTA) 200-25 MCG/INH AEPB, Inhale 1 puff into the lungs daily., Disp: 28 each, Rfl: 0 .  ibuprofen (ADVIL,MOTRIN) 800 MG tablet, Take 800 mg by mouth 3 (three) times daily with meals. # 90 3 refills, Disp: , Rfl:  Allergies: Erythromycin and Bee venom  Review of Systems  Constitutional: Negative for chills, fever and malaise/fatigue.  HENT: Negative for congestion, sinus pain and sore throat.   Eyes: Negative for blurred vision and pain.  Respiratory: Negative for cough and wheezing.   Cardiovascular: Negative for chest pain and leg swelling.   Gastrointestinal: Negative for abdominal pain, constipation, diarrhea, heartburn, nausea and vomiting.  Genitourinary: Negative for dysuria, frequency, hematuria and urgency.  Musculoskeletal: Negative for back pain, joint pain, myalgias and neck pain.  Skin: Negative for itching and rash.  Neurological: Negative for dizziness, tremors and weakness.  Endo/Heme/Allergies: Does not bruise/bleed easily.  Psychiatric/Behavioral: Negative for depression. The patient is not nervous/anxious and does not have insomnia.     Objective: BP 120/80   Ht 5\' 6"  (1.676 m)   Wt 158 lb (71.7 kg)   LMP 08/23/2018 (Exact Date)   BMI 25.50 kg/m   Filed Weights   09/22/18 1113  Weight: 158 lb (71.7 kg)   Physical Exam Constitutional:      General: She is not in acute distress.    Appearance: She is well-developed.  HENT:     Head: Normocephalic and atraumatic. No laceration.     Right Ear: Hearing normal.     Left Ear: Hearing normal.     Mouth/Throat:     Pharynx: Uvula midline.  Eyes:     Pupils: Pupils are equal, round, and reactive to light.  Neck:     Musculoskeletal: Normal range of motion and neck supple.     Thyroid: No thyromegaly.  Cardiovascular:     Rate and Rhythm: Normal rate and regular rhythm.     Heart sounds: No murmur. No friction rub. No gallop.   Pulmonary:     Effort: Pulmonary effort is normal. No respiratory distress.     Breath sounds: Normal breath sounds. No wheezing.  Chest:     Breasts:        Right: No mass, skin change or tenderness.        Left: No mass, skin change or tenderness.  Abdominal:     General: Bowel sounds are normal. There is no distension.     Palpations: Abdomen is soft.     Tenderness: There is no abdominal tenderness. There is no rebound.  Musculoskeletal: Normal range of motion.  Neurological:     Mental Status: She is alert and oriented to person, place, and time.     Cranial Nerves: No cranial nerve deficit.  Skin:    General: Skin  is warm and dry.  Psychiatric:        Judgment: Judgment normal.  Vitals signs reviewed.     Assessment:    ICD-10-CM   1. Abnormal uterine bleeding  N93.9   2. Endometrial polyp  N84.0   Plan Hysteroscopy D&C w Polypectomy  I have had a careful discussion with this patient about all the  options available and the risk/benefits of each. I have fully informed this patient that surgery may subject her to a variety of discomforts and risks: She understands that most patients have surgery with little difficulty, but problems can happen ranging from minor to fatal. These include nausea, vomiting, pain, bleeding, infection, poor healing, hernia, or formation of adhesions. Unexpected reactions may occur from any drug or anesthetic given. Unintended injury may occur to other pelvic or abdominal structures such as Fallopian tubes, ovaries, bladder, ureter (tube from kidney to bladder), or bowel. Nerves going from the pelvis to the legs may be injured. Any such injury may require immediate or later additional surgery to correct the problem. Excessive blood loss requiring transfusion is very unlikely but possible. Dangerous blood clots may form in the legs or lungs. Physical and sexual activity will be restricted in varying degrees for an indeterminate period of time but most often 2-6 weeks.  Finally, she understands that it is impossible to list every possible undesirable effect and that the condition for which surgery is done is not always cured or significantly improved, and in rare cases may be even worse.Ample time was given to answer all questions.  Barnett Applebaum, MD, [facility], [facility] 09/22/2018  11:42 AM

## 2018-09-22 NOTE — Progress Notes (Signed)
PRE-OPERATIVE HISTORY AND PHYSICAL EXAM  HPI:  Janice Donovan is a 53 y.o. [id number] Patient's last menstrual period was 08/23/2018 (exact date).; she is being admitted for surgery related to abnormal uterine bleeding.   She had gone a few months off of BCP w no periods, then restarted 3 mos ago w also BTB that is frequent and heavy.  US showed thickening and polyp.  Also ovarian cyst.  EMB done as well, showing non-cancerous polyp.  PMHx: Past Medical History:  Diagnosis Date  . Adrenal nodule ([place])   . Allergic rhinitis   . Asthma   . Cervical polyp   . Constipation   . Generalized abdominal pain   . GERD (gastroesophageal reflux disease)   . History of mammogram 2015;01/02/15; 01/06/16   birads 1; birads 1; neg  . History of Papanicolaou smear of cervix 2011;03/10/15   neg; neg  . IBS (irritable bowel syndrome)   . Mood disorder ([place])   . Obesity   . Plantar fasciitis of left foot 09/29/2012  . Renal lesion   . Vaginal birth after cesarean 1993   Past Surgical History:  Procedure Laterality Date  . ANTERIOR CERVICAL DECOMP/DISCECTOMY FUSION  2005   C4-6  . CESAREAN SECTION  1990  . colonoscopy with polypectomy  06/2007; 2016   abd pain and chronic coinstipation  . DIAGNOSTIC LAPAROSCOPY     normal pelvis  . ESOPHAGOGASTRODUODENOSCOPY  06/2007; 2016   gastric polyps  . LEEP  12/06/2008   cx polyp removal-path was HPV changes - [facility]   Family History  Problem Relation Age of Onset  . Diabetes Father   . Hypertension Father   . Benign prostatic hyperplasia Father   . Bladder Cancer Father   . Hyperlipidemia Father   . Dementia Father        Lewy Body Dementia  . Asthma Father   . Melanoma Father   . COPD Mother   . Heart disease Mother   . Stroke Mother   . Hypertension Mother   . Diabetes Sister   . Hyperlipidemia Brother   . Hypertension Brother   . Cancer Maternal Grandmother 50       pancreatic  . Prostate cancer Neg Hx   . Kidney disease Neg Hx     Social History   Tobacco Use  . Smoking status: Never Smoker  . Smokeless tobacco: Never Used  Substance Use Topics  . Alcohol use: No    Alcohol/week: 0.0 standard drinks  . Drug use: No    Current Outpatient Medications:  .  buPROPion (WELLBUTRIN XL) 150 MG 24 hr tablet, TAKE 1 TABLET BY MOUTH  DAILY, Disp: 90 tablet, Rfl: 3 .  calcium carbonate (CALCIUM 600) 1500 (600 Ca) MG TABS tablet, Take 600 mg of elemental calcium by mouth daily with breakfast., Disp: , Rfl:  .  Cholecalciferol (VITAMIN D-1000 MAX ST) 25 MCG (1000 UT) tablet, Take by mouth., Disp: , Rfl:  .  fexofenadine (ALLEGRA) 180 MG tablet, Take by mouth., Disp: , Rfl:  .  fluticasone (FLONASE) 50 MCG/ACT nasal spray, USE 2 SPRAYS IN EACH  NOSTRIL DAILY, Disp: 48 g, Rfl: 1 .  montelukast (SINGULAIR) 10 MG tablet, , Disp: , Rfl:  .  Multiple Vitamin (MULTI-VITAMINS) TABS, Take by mouth., Disp: , Rfl:  .  nystatin-triamcinolone (MYCOLOG II) cream, Apply 1 application topically 2 (two) times daily as needed., Disp: 60 g, Rfl: 0 .  pantoprazole (PROTONIX) 40 MG tablet, , Disp: ,  Rfl:  .  PROAIR HFA 108 (90 Base) MCG/ACT inhaler, Inhale 2 puffs into the lungs every 4 (four) hours as needed., Disp: 18 g, Rfl: 5 .  senna-docusate (SENOKOT-S) 8.6-50 MG per tablet, Take by mouth., Disp: , Rfl:  .  UNABLE TO FIND, , Disp: , Rfl:  .  fluticasone furoate-vilanterol (BREO ELLIPTA) 200-25 MCG/INH AEPB, Inhale 1 puff into the lungs daily., Disp: 28 each, Rfl: 0 .  ibuprofen (ADVIL,MOTRIN) 800 MG tablet, Take 800 mg by mouth 3 (three) times daily with meals. # 90 3 refills, Disp: , Rfl:  Allergies: Erythromycin and Bee venom  Review of Systems  Constitutional: Negative for chills, fever and malaise/fatigue.  HENT: Negative for congestion, sinus pain and sore throat.   Eyes: Negative for blurred vision and pain.  Respiratory: Negative for cough and wheezing.   Cardiovascular: Negative for chest pain and leg swelling.   Gastrointestinal: Negative for abdominal pain, constipation, diarrhea, heartburn, nausea and vomiting.  Genitourinary: Negative for dysuria, frequency, hematuria and urgency.  Musculoskeletal: Negative for back pain, joint pain, myalgias and neck pain.  Skin: Negative for itching and rash.  Neurological: Negative for dizziness, tremors and weakness.  Endo/Heme/Allergies: Does not bruise/bleed easily.  Psychiatric/Behavioral: Negative for depression. The patient is not nervous/anxious and does not have insomnia.     Objective: BP 120/80   Ht 5\' 6"  (1.676 m)   Wt 158 lb (71.7 kg)   LMP 08/23/2018 (Exact Date)   BMI 25.50 kg/m   Filed Weights   09/22/18 1113  Weight: 158 lb (71.7 kg)   Physical Exam Constitutional:      General: She is not in acute distress.    Appearance: She is well-developed.  HENT:     Head: Normocephalic and atraumatic. No laceration.     Right Ear: Hearing normal.     Left Ear: Hearing normal.     Mouth/Throat:     Pharynx: Uvula midline.  Eyes:     Pupils: Pupils are equal, round, and reactive to light.  Neck:     Musculoskeletal: Normal range of motion and neck supple.     Thyroid: No thyromegaly.  Cardiovascular:     Rate and Rhythm: Normal rate and regular rhythm.     Heart sounds: No murmur. No friction rub. No gallop.   Pulmonary:     Effort: Pulmonary effort is normal. No respiratory distress.     Breath sounds: Normal breath sounds. No wheezing.  Chest:     Breasts:        Right: No mass, skin change or tenderness.        Left: No mass, skin change or tenderness.  Abdominal:     General: Bowel sounds are normal. There is no distension.     Palpations: Abdomen is soft.     Tenderness: There is no abdominal tenderness. There is no rebound.  Musculoskeletal: Normal range of motion.  Neurological:     Mental Status: She is alert and oriented to person, place, and time.     Cranial Nerves: No cranial nerve deficit.  Skin:    General: Skin  is warm and dry.  Psychiatric:        Judgment: Judgment normal.  Vitals signs reviewed.     Assessment:    ICD-10-CM   1. Abnormal uterine bleeding  N93.9   2. Endometrial polyp  N84.0   Plan Hysteroscopy D&C w Polypectomy  I have had a careful discussion with this patient about all the  options available and the risk/benefits of each. I have fully informed this patient that surgery may subject her to a variety of discomforts and risks: She understands that most patients have surgery with little difficulty, but problems can happen ranging from minor to fatal. These include nausea, vomiting, pain, bleeding, infection, poor healing, hernia, or formation of adhesions. Unexpected reactions may occur from any drug or anesthetic given. Unintended injury may occur to other pelvic or abdominal structures such as Fallopian tubes, ovaries, bladder, ureter (tube from kidney to bladder), or bowel. Nerves going from the pelvis to the legs may be injured. Any such injury may require immediate or later additional surgery to correct the problem. Excessive blood loss requiring transfusion is very unlikely but possible. Dangerous blood clots may form in the legs or lungs. Physical and sexual activity will be restricted in varying degrees for an indeterminate period of time but most often 2-6 weeks.  Finally, she understands that it is impossible to list every possible undesirable effect and that the condition for which surgery is done is not always cured or significantly improved, and in rare cases may be even worse.Ample time was given to answer all questions.  Barnett Applebaum, MD, [facility], [facility] 09/22/2018  11:42 AM

## 2018-09-22 NOTE — Patient Instructions (Signed)
PRE ADMISSION TESTING For Covid, prior to procedure  9:00-10:00 [facility] entrance (drive up)  Results in 48-72 hours You will not receive notification if test results are negative. If positive for Covid19, your provider will notify you by phone, with additional instructions.   Hysteroscopy Hysteroscopy is a procedure that is used to examine the inside of a woman's womb (uterus). This may be done for various reasons, including:  To look for lumps (tumors) and other growths in the uterus.  To evaluate abnormal bleeding, fibroid tumors, polyps, scar tissue (adhesions), or cancer of the uterus.  To determine the cause of an inability to get pregnant (infertility) or repeated losses of pregnancies (miscarriages).  To find a lost IUD (intrauterine device).  To perform a procedure that permanently prevents pregnancy (sterilization). During this procedure, a thin, flexible tube with a small light and camera (hysteroscope) is used to examine the uterus. The camera sends images to a monitor in the room so that your health care provider can view the inside of your uterus. A hysteroscopy should be done right after a menstrual period to make sure that you are not pregnant. Tell a health care provider about:  Any allergies you have.  All medicines you are taking, including vitamins, herbs, eye drops, creams, and over-the-counter medicines.  Any problems you or family members have had with the use of anesthetic medicines.  Any blood disorders you have.  Any surgeries you have had.  Any medical conditions you have.  Whether you are pregnant or may be pregnant. What are the risks? Generally, this is a safe procedure. However, problems may occur, including:  Excessive bleeding.  Infection.  Damage to the uterus or other structures or organs.  Allergic reaction to medicines or fluids that are used in the procedure. What happens before the procedure? Staying hydrated Follow  instructions from your health care provider about hydration, which may include:  Up to 2 hours before the procedure - you may continue to drink clear liquids, such as water, clear fruit juice, black coffee, and plain tea. Eating and drinking restrictions Follow instructions from your health care provider about eating and drinking, which may include:  8 hours before the procedure - stop eating solid foods and drink clear liquids only  2 hours before the procedure - stop drinking clear liquids. General instructions  Ask your health care provider about: ? Changing or stopping your normal medicines. This is important if you take diabetes medicines or blood thinners. ? Taking medicines such as aspirin and ibuprofen. These medicines can thin your blood and cause bleeding. Do not take these medicines for 1 week before your procedure, or as told by your health care provider.  Do not use any products that contain nicotine or tobacco for 2 weeks before the procedure. This includes cigarettes and e-cigarettes. If you need help quitting, ask your health care provider.  Medicine may be placed in your cervix the day before the procedure. This medicine causes the cervix to have a larger opening (dilate). The larger opening makes it easier for the hysteroscope to be inserted into the uterus during the procedure.  Plan to have someone with you for the first 24-48 hours after the procedure, especially if you are given a medicine to make you fall asleep (general anesthetic).  Plan to have someone take you home from the hospital or clinic. What happens during the procedure?  To lower your risk of infection: ? Your health care team will wash or sanitize  their hands. ? Your skin will be washed with soap. ? Hair may be removed from the surgical area.  An IV tube will be inserted into one of your veins.  You may be given one or more of the following: ? A medicine to help you relax (sedative). ? A medicine  that numbs the area around the cervix (local anesthetic). ? A medicine to make you fall asleep (general anesthetic).  A hysteroscope will be inserted through your vagina and into your uterus.  Air or fluid will be used to enlarge your uterus, enabling your health care provider to see your uterus better. The amount of fluid used will be carefully checked throughout the procedure.  In some cases, tissue may be gently scraped from inside the uterus and sent to a lab for testing (biopsy). The procedure may vary among health care providers and hospitals. What happens after the procedure?  Your blood pressure, heart rate, breathing rate, and blood oxygen level will be monitored until the medicines you were given have worn off.  You may have some cramping. You may be given medicines for this.  You may have bleeding, which varies from light spotting to menstrual-like bleeding. This is normal.  If you had a biopsy done, it is your responsibility to get the results of your procedure. Ask your health care provider, or the department performing the procedure, when your results will be ready. Summary  Hysteroscopy is a procedure that is used to examine the inside of a woman's womb (uterus).  After the procedure, you may have bleeding, which varies from light spotting to menstrual-like bleeding. This is normal. You may also have cramping.  Plan to have someone take you home from the hospital or clinic. This information is not intended to replace advice given to you by your health care provider. Make sure you discuss any questions you have with your health care provider. Document Released: 05/04/2000 Document Revised: 01/08/2017 Document Reviewed: 02/25/2016 Elsevier Patient Education  2020 [employer].

## 2018-09-23 ENCOUNTER — Telehealth: Payer: Self-pay | Admitting: Obstetrics & Gynecology

## 2018-09-23 NOTE — Telephone Encounter (Signed)
Patient is aware of Pre-admit testing phone interview to be scheduled (patient will watch for notification in [place]), Covid testing on Mon, 10/10/18 @ 8-10:30am, and OR on 10/13/18. Patient is aware she will be asked to quarantine after Covid testing. Patient is aware she may receive calls from the [place] and [facility]. Patient confirmed [employer] and no secondary insurance.

## 2018-09-23 NOTE — Telephone Encounter (Signed)
-----   Message from Gae Dry, MD sent at 09/22/2018 11:41 AM EDT ----- Regarding: surgery Surgery Booking Request Patient Full Name:  Janice Donovan  MRN: [id number]  DOB: [birth year]  Surgeon: Hoyt Koch, MD  Requested Surgery Date and Time: Any Primary Diagnosis AND Code: Endometrial polyps, Abnormal bleeding Secondary Diagnosis and Code:   ICD-10-CM  1. Abnormal uterine bleeding  N93.9  2. Endometrial polyp  N84.0  Surgical Procedure: Hysteroscopy D&C, Polypectomy L&D Notification: No Admission Status: same day surgery Length of Surgery: 20 min Special Case Needs: no H&P: Done today 09/22/2018 Phone Interview???: yes Interpreter: Language:  Medical Clearance: no Special Scheduling Instructions: no Acuity: P3

## 2018-09-29 ENCOUNTER — Other Ambulatory Visit: Payer: Self-pay | Admitting: Certified Nurse Midwife

## 2018-10-06 ENCOUNTER — Encounter
Admission: RE | Admit: 2018-10-06 | Discharge: 2018-10-06 | Disposition: A | Discharge status: Home / Self Care | Payer: 59 | Source: Ambulatory Visit | Attending: Obstetrics & Gynecology | Admitting: Obstetrics & Gynecology

## 2018-10-06 ENCOUNTER — Other Ambulatory Visit: Payer: Self-pay

## 2018-10-06 NOTE — Patient Instructions (Addendum)
Your procedure is scheduled on: Thursday 10/13/18 Report to [place]. To find out your arrival time please call [phone] between 1PM - 3PM on Wednesday 10/12/18 .  Remember: Instructions that are not followed completely may result in serious medical risk, up to and including death, or upon the discretion of your surgeon and anesthesiologist your surgery may need to be rescheduled.     _X__ 1. Do not eat food after midnight the night before your procedure.                 No gum chewing or hard candies. You may drink clear liquids up to 2 hours                 before you are scheduled to arrive for your surgery- DO not drink clear                 liquids within 2 hours of the start of your surgery.                 Clear Liquids include:  water, apple juice without pulp, clear carbohydrate                 drink such as Clearfast or Gatorade, Black Coffee or Tea (Do not add                 anything to coffee or tea). Diabetics water only  __X__2.  On the morning of surgery brush your teeth with toothpaste and water, you                 may rinse your mouth with mouthwash if you wish.  Do not swallow any              toothpaste of mouthwash.     _X__ 3.  No Alcohol for 24 hours before or after surgery.   _X__ 4.  Do Not Smoke or use e-cigarettes For 24 Hours Prior to Your Surgery.                 Do not use any chewable tobacco products for at least 6 hours prior to                 surgery.  ____  5.  Bring all medications with you on the day of surgery if instructed.   __X__  6.  Notify your doctor if there is any change in your medical condition      (cold, fever, infections).     Do not wear jewelry, make-up, hairpins, clips or nail polish. Do not wear lotions, powders, or perfumes.  Do not shave 48 hours prior to surgery. Men may shave face and neck. Do not bring valuables to the hospital.    [facility] is not responsible for  any belongings or valuables.  Contacts, dentures/partials or body piercings may not be worn into surgery. Bring a case for your contacts, glasses or hearing aids, a denture cup will be supplied. Leave your suitcase in the car. After surgery it may be brought to your room. For patients admitted to the hospital, discharge time is determined by your treatment team.   Patients discharged the day of surgery will not be allowed to drive home.   Please read over the following fact sheets that you were given:   MRSA Information  __X__ Take these medicines the morning of surgery with A SIP OF  WATER:    1.  buPROPion (WELLBUTRIN   2. pantoprazole (PROTONIX  3.   4.  5.  6.  ____ Fleet Enema (as directed)   __ __ Use CHG Soap/SAGE wipes as directed  __X__ Use inhalers on the day of surgery  (HELPS OPEN UP THE LUNGS)  ____ Stop metformin/Janumet/Farxiga 2 days prior to surgery    ____ Take 1/2 of usual insulin dose the night before surgery. No insulin the morning          of surgery.   ____ Stop Blood Thinners Coumadin/Plavix/Xarelto/Pleta/Pradaxa/Eliquis/Effient/Aspirin  on   Or contact your Surgeon, Cardiologist or Medical Doctor regarding  ability to stop your blood thinners  __X__ Stop Anti-inflammatories 7 days before surgery such as Advil, Ibuprofen, Motrin,  BC or Goodies Powder, Naprosyn, Naproxen, Aleve, Aspirin    __X__ Stop all herbal supplements, fish oil or vitamin E until after surgery.    ____ Bring C-Pap to the hospital.      Telephone interview with review of instructions. Patient to come for Covid screening and labs. Written instructions to be provided at that time. Patient verbalized understanding./cn

## 2018-10-10 ENCOUNTER — Other Ambulatory Visit: Payer: Self-pay

## 2018-10-10 ENCOUNTER — Other Ambulatory Visit
Admission: RE | Admit: 2018-10-10 | Discharge: 2018-10-10 | Disposition: A | Discharge status: Home / Self Care | Payer: 59 | Source: Ambulatory Visit | Attending: Obstetrics & Gynecology | Admitting: Obstetrics & Gynecology

## 2018-10-10 DIAGNOSIS — Z01812 Encounter for preprocedural laboratory examination: Secondary | ICD-10-CM | POA: Insufficient documentation

## 2018-10-10 DIAGNOSIS — Z20828 Contact with and (suspected) exposure to other viral communicable diseases: Secondary | ICD-10-CM | POA: Diagnosis not present

## 2018-10-10 LAB — CBC
HCT: 41.8 % (ref 36.0–46.0)
Hemoglobin: 13.9 g/dL (ref 12.0–15.0)
MCH: 30.8 pg (ref 26.0–34.0)
MCHC: 33.3 g/dL (ref 30.0–36.0)
MCV: 92.7 fL (ref 80.0–100.0)
Platelets: 206 10*3/uL (ref 150–400)
RBC: 4.51 MIL/uL (ref 3.87–5.11)
RDW: 12.6 % (ref 11.5–15.5)
WBC: 5.8 10*3/uL (ref 4.0–10.5)
nRBC: 0 % (ref 0.0–0.2)

## 2018-10-10 LAB — TYPE AND SCREEN
ABO/RH(D): O POS
Antibody Screen: NEGATIVE

## 2018-10-10 LAB — SARS CORONAVIRUS 2 (TAT 6-24 HRS): SARS Coronavirus 2: NEGATIVE

## 2018-10-13 ENCOUNTER — Ambulatory Visit: Payer: 59 | Admitting: Certified Registered Nurse Anesthetist

## 2018-10-13 ENCOUNTER — Other Ambulatory Visit: Payer: Self-pay

## 2018-10-13 ENCOUNTER — Ambulatory Visit
Admission: RE | Admit: 2018-10-13 | Discharge: 2018-10-13 | Disposition: A | Discharge status: Home / Self Care | Payer: 59 | Attending: Obstetrics & Gynecology | Admitting: Obstetrics & Gynecology

## 2018-10-13 ENCOUNTER — Encounter
Admission: RE | Disposition: A | Discharge status: Home / Self Care | Payer: Self-pay | Source: Home / Self Care | Attending: Obstetrics & Gynecology

## 2018-10-13 DIAGNOSIS — J45909 Unspecified asthma, uncomplicated: Secondary | ICD-10-CM | POA: Insufficient documentation

## 2018-10-13 DIAGNOSIS — Z7951 Long term (current) use of inhaled steroids: Secondary | ICD-10-CM | POA: Insufficient documentation

## 2018-10-13 DIAGNOSIS — Z881 Allergy status to other antibiotic agents status: Secondary | ICD-10-CM | POA: Diagnosis not present

## 2018-10-13 DIAGNOSIS — N939 Abnormal uterine and vaginal bleeding, unspecified: Secondary | ICD-10-CM | POA: Diagnosis not present

## 2018-10-13 DIAGNOSIS — N840 Polyp of corpus uteri: Secondary | ICD-10-CM | POA: Diagnosis present

## 2018-10-13 DIAGNOSIS — N83209 Unspecified ovarian cyst, unspecified side: Secondary | ICD-10-CM | POA: Insufficient documentation

## 2018-10-13 DIAGNOSIS — Z79899 Other long term (current) drug therapy: Secondary | ICD-10-CM | POA: Insufficient documentation

## 2018-10-13 DIAGNOSIS — E669 Obesity, unspecified: Secondary | ICD-10-CM | POA: Diagnosis not present

## 2018-10-13 DIAGNOSIS — N920 Excessive and frequent menstruation with regular cycle: Secondary | ICD-10-CM | POA: Insufficient documentation

## 2018-10-13 DIAGNOSIS — Z91030 Bee allergy status: Secondary | ICD-10-CM | POA: Diagnosis not present

## 2018-10-13 DIAGNOSIS — K219 Gastro-esophageal reflux disease without esophagitis: Secondary | ICD-10-CM | POA: Insufficient documentation

## 2018-10-13 DIAGNOSIS — R9389 Abnormal findings on diagnostic imaging of other specified body structures: Secondary | ICD-10-CM | POA: Diagnosis present

## 2018-10-13 HISTORY — PX: HYSTEROSCOPY WITH D & C: SHX1775

## 2018-10-13 LAB — POCT PREGNANCY, URINE: Preg Test, Ur: NEGATIVE

## 2018-10-13 LAB — ABO/RH: ABO/RH(D): O POS

## 2018-10-13 SURGERY — DILATATION AND CURETTAGE /HYSTEROSCOPY
Anesthesia: General

## 2018-10-13 MED ORDER — PROMETHAZINE HCL 25 MG/ML IJ SOLN
6.2500 mg | INTRAMUSCULAR | Status: DC | PRN
  Administered 2018-10-13: 12:00:00 6.25 mg via INTRAVENOUS

## 2018-10-13 MED ORDER — KETOROLAC TROMETHAMINE 30 MG/ML IJ SOLN
INTRAMUSCULAR | Status: DC | PRN
  Administered 2018-10-13: 30 mg via INTRAVENOUS

## 2018-10-13 MED ORDER — DEXAMETHASONE SODIUM PHOSPHATE 10 MG/ML IJ SOLN
INTRAMUSCULAR | Status: DC | PRN
  Administered 2018-10-13: 10 mg via INTRAVENOUS

## 2018-10-13 MED ORDER — ONDANSETRON HCL 4 MG/2ML IJ SOLN
INTRAMUSCULAR | Status: DC | PRN
  Administered 2018-10-13: 4 mg via INTRAVENOUS

## 2018-10-13 MED ORDER — PROMETHAZINE HCL 25 MG/ML IJ SOLN
INTRAMUSCULAR | Status: AC
  Filled 2018-10-13: qty 1

## 2018-10-13 MED ORDER — LACTATED RINGERS IV SOLN
INTRAVENOUS | Status: DC
  Administered 2018-10-13: 10:00:00 via INTRAVENOUS

## 2018-10-13 MED ORDER — KETOROLAC TROMETHAMINE 30 MG/ML IJ SOLN: 30.0000 mg | Freq: Once | INTRAMUSCULAR | Status: DC | PRN

## 2018-10-13 MED ORDER — MORPHINE SULFATE (PF) 4 MG/ML IV SOLN: 1.0000 mg | INTRAVENOUS | Status: DC | PRN

## 2018-10-13 MED ORDER — ACETAMINOPHEN NICU IV SYRINGE 10 MG/ML
INTRAVENOUS | Status: AC
  Filled 2018-10-13: qty 1

## 2018-10-13 MED ORDER — MEPERIDINE HCL 50 MG/ML IJ SOLN: 6.2500 mg | INTRAMUSCULAR | Status: DC | PRN

## 2018-10-13 MED ORDER — LIDOCAINE HCL (CARDIAC) PF 100 MG/5ML IV SOSY
PREFILLED_SYRINGE | INTRAVENOUS | Status: DC | PRN
  Administered 2018-10-13: 70 mg via INTRAVENOUS

## 2018-10-13 MED ORDER — FENTANYL CITRATE (PF) 100 MCG/2ML IJ SOLN
INTRAMUSCULAR | Status: DC | PRN
  Administered 2018-10-13 (×2): 25 ug via INTRAVENOUS

## 2018-10-13 MED ORDER — ACETAMINOPHEN 160 MG/5ML PO SOLN
325.0000 mg | ORAL | Status: DC | PRN
  Filled 2018-10-13: qty 20.3

## 2018-10-13 MED ORDER — SODIUM CHLORIDE FLUSH 0.9 % IV SOLN
INTRAVENOUS | Status: AC
  Filled 2018-10-13: qty 10

## 2018-10-13 MED ORDER — MIDAZOLAM HCL 2 MG/2ML IJ SOLN
INTRAMUSCULAR | Status: DC | PRN
  Administered 2018-10-13: 2 mg via INTRAVENOUS

## 2018-10-13 MED ORDER — KETOROLAC TROMETHAMINE 30 MG/ML IJ SOLN
30.0000 mg | Freq: Four times a day (QID) | INTRAMUSCULAR | Status: DC
  Filled 2018-10-13: qty 1

## 2018-10-13 MED ORDER — ACETAMINOPHEN 325 MG PO TABS: 650.0000 mg | ORAL_TABLET | ORAL | Status: DC | PRN

## 2018-10-13 MED ORDER — ACETAMINOPHEN 650 MG RE SUPP
650.0000 mg | RECTAL | Status: DC | PRN
  Filled 2018-10-13: qty 1

## 2018-10-13 MED ORDER — PROPOFOL 10 MG/ML IV BOLUS
INTRAVENOUS | Status: DC | PRN
  Administered 2018-10-13: 150 mg via INTRAVENOUS

## 2018-10-13 MED ORDER — OXYCODONE-ACETAMINOPHEN 5-325 MG PO TABS: 1.0000 | ORAL_TABLET | ORAL | 0 refills | Status: DC | PRN

## 2018-10-13 MED ORDER — FENTANYL CITRATE (PF) 100 MCG/2ML IJ SOLN
25.0000 ug | INTRAMUSCULAR | Status: DC | PRN
  Administered 2018-10-13 (×2): 50 ug via INTRAVENOUS

## 2018-10-13 MED ORDER — FENTANYL CITRATE (PF) 100 MCG/2ML IJ SOLN
INTRAMUSCULAR | Status: AC
  Filled 2018-10-13: qty 2

## 2018-10-13 MED ORDER — FENTANYL CITRATE (PF) 100 MCG/2ML IJ SOLN
INTRAMUSCULAR | Status: AC
  Administered 2018-10-13: 50 ug via INTRAVENOUS
  Filled 2018-10-13: qty 2

## 2018-10-13 MED ORDER — LACTATED RINGERS IV SOLN: INTRAVENOUS | Status: DC

## 2018-10-13 MED ORDER — MIDAZOLAM HCL 2 MG/2ML IJ SOLN
INTRAMUSCULAR | Status: AC
  Filled 2018-10-13: qty 2

## 2018-10-13 MED ORDER — OXYCODONE-ACETAMINOPHEN 5-325 MG PO TABS: 1.0000 | ORAL_TABLET | ORAL | Status: DC | PRN

## 2018-10-13 MED ORDER — HYDROCODONE-ACETAMINOPHEN 7.5-325 MG PO TABS
1.0000 | ORAL_TABLET | Freq: Once | ORAL | Status: DC | PRN
  Filled 2018-10-13: qty 1

## 2018-10-13 MED ORDER — ACETAMINOPHEN 10 MG/ML IV SOLN
INTRAVENOUS | Status: DC | PRN
  Administered 2018-10-13: 1000 mg via INTRAVENOUS

## 2018-10-13 MED ORDER — ACETAMINOPHEN 325 MG PO TABS: 325.0000 mg | ORAL_TABLET | ORAL | Status: DC | PRN

## 2018-10-13 SURGICAL SUPPLY — 17 items
CATH ROBINSON RED A/P 16FR (CATHETERS) ×2 IMPLANT
COVER WAND RF STERILE (DRAPES) ×2 IMPLANT
GLOVE BIO SURGEON STRL SZ7 (GLOVE) ×2 IMPLANT
GLOVE BIO SURGEON STRL SZ8 (GLOVE) ×4 IMPLANT
GLOVE BIOGEL PI IND STRL 6.5 (GLOVE) ×1 IMPLANT
GLOVE BIOGEL PI INDICATOR 6.5 (GLOVE) ×1
GOWN STRL REUS W/ TWL LRG LVL3 (GOWN DISPOSABLE) ×2 IMPLANT
GOWN STRL REUS W/ TWL XL LVL3 (GOWN DISPOSABLE) ×1 IMPLANT
GOWN STRL REUS W/TWL LRG LVL3 (GOWN DISPOSABLE) ×2
GOWN STRL REUS W/TWL XL LVL3 (GOWN DISPOSABLE) ×1
KIT PROCEDURE FLUENT (KITS) IMPLANT
KIT TURNOVER CYSTO (KITS) ×2 IMPLANT
PACK DNC HYST (MISCELLANEOUS) ×2 IMPLANT
PAD OB MATERNITY 4.3X12.25 (PERSONAL CARE ITEMS) ×2 IMPLANT
PAD PREP 24X41 OB/GYN DISP (PERSONAL CARE ITEMS) ×2 IMPLANT
SEAL ROD LENS SCOPE MYOSURE (ABLATOR) IMPLANT
TOWEL OR 17X26 4PK STRL BLUE (TOWEL DISPOSABLE) ×2 IMPLANT

## 2018-10-13 NOTE — Anesthesia Post-op Follow-up Note (Signed)
Anesthesia QCDR form completed.        

## 2018-10-13 NOTE — Discharge Instructions (Signed)
Hysteroscopy, Care After °This sheet gives you information about how to care for yourself after your procedure. Your health care provider may also give you more specific instructions. If you have problems or questions, contact your health care provider. °What can I expect after the procedure? °After the procedure, it is common to have: °· Cramping. °· Bleeding. This can vary from light spotting to menstrual-like bleeding. °Follow these instructions at home: °Activity °· Rest for 1-2 days after the procedure. °· Do not douche, use tampons, or have sex for 2 weeks after the procedure, or until your health care provider approves. °· Do not drive for 24 hours after the procedure, or for as long as told by your health care provider. °· Do not drive, use heavy machinery, or drink alcohol while taking prescription pain medicines. °Medicines ° °· Take over-the-counter and prescription medicines only as told by your health care provider. °· Do not take aspirin during recovery. It can increase the risk of bleeding. °General instructions °· Do not take baths, swim, or use a hot tub until your health care provider approves. Take showers instead of baths for 2 weeks, or for as long as told by your health care provider. °· To prevent or treat constipation while you are taking prescription pain medicine, your health care provider may recommend that you: °? Drink enough fluid to keep your urine clear or pale yellow. °? Take over-the-counter or prescription medicines. °? Eat foods that are high in fiber, such as fresh fruits and vegetables, whole grains, and beans. °? Limit foods that are high in fat and processed sugars, such as fried and sweet foods. °· Keep all follow-up visits as told by your health care provider. This is important. °Contact a health care provider if: °· You feel dizzy or lightheaded. °· You feel nauseous. °· You have abnormal vaginal discharge. °· You have a rash. °· You have pain that does not get better with  medicine. °· You have chills. °Get help right away if: °· You have bleeding that is heavier than a normal menstrual period. °· You have a fever. °· You have pain or cramps that get worse. °· You develop new abdominal pain. °· You faint. °· You have pain in your shoulders. °· You have shortness of breath. °Summary °· After the procedure, you may have cramping and some vaginal bleeding. °· Do not douche, use tampons, or have sex for 2 weeks after the procedure, or until your health care provider approves. °· Do not take baths, swim, or use a hot tub until your health care provider approves. Take showers instead of baths for 2 weeks, or for as long as told by your health care provider. °· Report any unusual symptoms to your health care provider. °· Keep all follow-up visits as told by your health care provider. This is important. °This information is not intended to replace advice given to you by your health care provider. Make sure you discuss any questions you have with your health care provider. °Document Released: 11/16/2012 Document Revised: 01/08/2017 Document Reviewed: 02/25/2016 °Elsevier Patient Education © 2020 Elsevier Inc[employer] ° °AMBULATORY SURGERY  °DISCHARGE INSTRUCTIONS ° ° °1) The drugs that you were given will stay in your system until tomorrow so for the next 24 hours you should not: ° °A) Drive an automobile °B) Make any legal decisions °C) Drink any alcoholic beverage ° ° °2) You may resume regular meals tomorrow.  Today it is better to start with liquids and gradually work   up to solid foods. ° °You may eat anything you prefer, but it is better to start with liquids, then soup and crackers, and gradually work up to solid foods. ° ° °3) Please notify your doctor immediately if you have any unusual bleeding, trouble breathing, redness and pain at the surgery site, drainage, fever, or pain not relieved by medication. ° ° ° °4) Additional Instructions: ° ° ° ° ° ° ° °Please contact your physician with any  problems or Same Day Surgery at [phone], Monday through Friday 6 am to 4 pm, or [facility] number at [phone]. ° °

## 2018-10-13 NOTE — Transfer of Care (Signed)
Immediate Anesthesia Transfer of Care Note  Patient: Janice Donovan  Procedure(s) Performed: DILATATION AND CURETTAGE /HYSTEROSCOPY (N/A )  Patient Location: PACU  Anesthesia Type:General  Level of Consciousness: awake, alert  and oriented  Airway & Oxygen Therapy: Patient Spontanous Breathing and Patient connected to face mask oxygen  Post-op Assessment: Report given to RN and Post -op Vital signs reviewed and stable  Post vital signs: Reviewed and stable  Last Vitals:  Vitals Value Taken Time  BP 129/91 10/13/18 1133  Temp    Pulse 76 10/13/18 1135  Resp 13 10/13/18 1135  SpO2 100 % 10/13/18 1135  Vitals shown include unvalidated device data.  Last Pain:  Vitals:   10/13/18 0941  TempSrc: Temporal  PainSc: 0-No pain         Complications: No apparent anesthesia complications

## 2018-10-13 NOTE — Op Note (Signed)
Operative Note  10/13/2018  PRE-OP DIAGNOSIS: Endometrial thickening and menorrhagia  POST-OP DIAGNOSIS: same   SURGEON: Barnett Applebaum, MD, FACOG  PROCEDURE: Procedure(s): DILATATION AND CURETTAGE /HYSTEROSCOPY   ANESTHESIA: Choice   ESTIMATED BLOOD LOSS: Min   SPECIMENS:  ECC, EMC  FLUID DEFICIT: None  COMPLICATIONS: None  DISPOSITION: PACU - hemodynamically stable.  CONDITION: stable  FINDINGS: Exam under anesthesia revealed small, mobile  uterus with no masses and bilateral adnexa without masses or fullness. Hysteroscopy revealed a thin grossly normal appearing uterine cavity and normal appearing endocervical canal.  PROCEDURE IN DETAIL: After informed consent was obtained, the patient was taken to the operating room where anesthesia was obtained without difficulty. The patient was positioned in the dorsal lithotomy position in [place]. The patient's bladder was catheterized with an in and out foley catheter. The patient was examined under anesthesia, with the above noted findings. The weightedspeculum was placed inside the patient's vagina, and the the anterior lip of the cervix was seen and grasped with the tenaculum.  An Endocervical specimen was obtained with a kevorkian curette. The uterine cavity was sounded to 8cm, and then the cervix was progressively dilated to a 16French-Pratt dilator. The 30 degree hysteroscope was introduced, with saline fluid used to distend the intrauterine cavity, with the above noted findings. No polyps seen except fro endocervical.  The hystersocope was removed and the uterine cavity was curetted until a gritty texture was noted, yielding endometrial curettings. Excellent hemostasis was noted, and all instruments were removed, with excellent hemostasis noted throughout. She was then taken out of dorsal lithotomy. Minimal discrepancy in fluid was noted.  The patient tolerated the procedure well. Sponge, lap and needle counts were correct x2. The  patient was taken to recovery room in excellent condition.  Barnett Applebaum, MD, [facility], [facility] 10/13/2018  11:25 AM

## 2018-10-13 NOTE — Anesthesia Postprocedure Evaluation (Signed)
Anesthesia Post Note  Patient: Janice Donovan  Procedure(s) Performed: DILATATION AND CURETTAGE /HYSTEROSCOPY (N/A )  Patient location during evaluation: PACU Anesthesia Type: General Level of consciousness: awake and alert Pain management: pain level controlled Vital Signs Assessment: post-procedure vital signs reviewed and stable Respiratory status: spontaneous breathing, nonlabored ventilation and respiratory function stable Cardiovascular status: blood pressure returned to baseline and stable Postop Assessment: no apparent nausea or vomiting Anesthetic complications: no     Last Vitals:  Vitals:   10/13/18 1248 10/13/18 1302  BP: 114/71 136/87  Pulse: 60 (!) 55  Resp: 13 16  Temp:  (!) 36.3 C  SpO2: 99% 98%    Last Pain:  Vitals:   10/13/18 1302  TempSrc: Temporal  PainSc: 0-No pain                 Alphonsus Sias

## 2018-10-13 NOTE — Anesthesia Preprocedure Evaluation (Signed)
Anesthesia Evaluation  Patient identified by MRN, date of birth, ID band Patient awake    Reviewed: Allergy & Precautions, H&P , NPO status , reviewed documented beta blocker date and time   Airway Mallampati: II  TM Distance: >3 FB Neck ROM: full    Dental  (+) Caps   Pulmonary asthma ,    Pulmonary exam normal        Cardiovascular Normal cardiovascular exam     Neuro/Psych PSYCHIATRIC DISORDERS    GI/Hepatic GERD  Medicated and Controlled,  Endo/Other    Renal/GU Renal disease     Musculoskeletal   Abdominal   Peds  Hematology  (+) Blood dyscrasia, anemia ,   Anesthesia Other Findings Past Medical History: No date: Adrenal nodule ([place]) No date: Allergic rhinitis No date: Asthma No date: Cervical polyp No date: Constipation No date: Generalized abdominal pain No date: GERD (gastroesophageal reflux disease) 2015;01/02/15; 01/06/16: History of mammogram     Comment:  birads 1; birads 1; neg 2011;03/10/15: History of Papanicolaou smear of cervix     Comment:  neg; neg No date: IBS (irritable bowel syndrome) No date: Mood disorder ([place]) No date: Obesity 09/29/2012: Plantar fasciitis of left foot No date: Renal lesion 1993: Vaginal birth after cesarean Past Surgical History: 2005: ANTERIOR CERVICAL DECOMP/DISCECTOMY FUSION     Comment:  C4-6 1990: CESAREAN SECTION 06/2007; 2016: colonoscopy with polypectomy     Comment:  abd pain and chronic coinstipation No date: DIAGNOSTIC LAPAROSCOPY     Comment:  normal pelvis 06/2007; 2016: ESOPHAGOGASTRODUODENOSCOPY     Comment:  gastric polyps 12/06/2008: LEEP     Comment:  cx polyp removal-path was HPV changes - CAK   Reproductive/Obstetrics                             Anesthesia Physical Anesthesia Plan  ASA: II  Anesthesia Plan: General   Post-op Pain Management:    Induction: Intravenous  PONV Risk Score and Plan: 4 or  greater and Ondansetron, Treatment may vary due to age or medical condition, Midazolam and Dexamethasone  Airway Management Planned: LMA  Additional Equipment:   Intra-op Plan:   Post-operative Plan: Extubation in OR  Informed Consent: I have reviewed the patients History and Physical, chart, labs and discussed the procedure including the risks, benefits and alternatives for the proposed anesthesia with the patient or authorized representative who has indicated his/her understanding and acceptance.     Dental Advisory Given  Plan Discussed with: CRNA  Anesthesia Plan Comments:         Anesthesia Quick Evaluation

## 2018-10-13 NOTE — Anesthesia Procedure Notes (Signed)
Procedure Name: LMA Insertion Date/Time: 10/13/2018 10:52 AM Performed by: Willette Alma, CRNA Pre-anesthesia Checklist: Patient identified, Patient being monitored, Timeout performed, Emergency Drugs available and Suction available Patient Re-evaluated:Patient Re-evaluated prior to induction Oxygen Delivery Method: Circle system utilized Preoxygenation: Pre-oxygenation with 100% oxygen Induction Type: IV induction Ventilation: Mask ventilation without difficulty LMA: LMA inserted LMA Size: 4.0 Tube type: Oral Number of attempts: 1 Placement Confirmation: positive ETCO2 and breath sounds checked- equal and bilateral Tube secured with: Tape Dental Injury: Teeth and Oropharynx as per pre-operative assessment

## 2018-10-13 NOTE — Interval H&P Note (Signed)
History and Physical Interval Note:  10/13/2018 10:22 AM  Janice Donovan  has presented today for surgery, with the diagnosis of ENDOMETRIAL POLYPS ABNORMAL BLEEDING.  The various methods of treatment have been discussed with the patient and family. After consideration of risks, benefits and other options for treatment, the patient has consented to  Procedure(s): DILATATION AND CURETTAGE /HYSTEROSCOPY, POLYPECTOMY (N/A) as a surgical intervention.  The patient's history has been reviewed, patient examined, no change in status, stable for surgery.  I have reviewed the patient's chart and labs.  Questions were answered to the patient's satisfaction.     Hoyt Koch

## 2018-10-14 LAB — SURGICAL PATHOLOGY

## 2018-10-15 ENCOUNTER — Other Ambulatory Visit: Payer: Self-pay | Admitting: Certified Nurse Midwife

## 2018-10-20 ENCOUNTER — Ambulatory Visit: Payer: 59 | Admitting: Obstetrics & Gynecology

## 2018-10-27 ENCOUNTER — Ambulatory Visit (INDEPENDENT_AMBULATORY_CARE_PROVIDER_SITE_OTHER): Payer: 59 | Admitting: Obstetrics & Gynecology

## 2018-10-27 ENCOUNTER — Other Ambulatory Visit: Payer: Self-pay

## 2018-10-27 ENCOUNTER — Encounter: Payer: Self-pay | Admitting: Obstetrics & Gynecology

## 2018-10-27 VITALS — BP 120/80 | Ht 66.0 in | Wt 156.0 lb

## 2018-10-27 DIAGNOSIS — N83202 Unspecified ovarian cyst, left side: Secondary | ICD-10-CM

## 2018-10-27 DIAGNOSIS — N939 Abnormal uterine and vaginal bleeding, unspecified: Secondary | ICD-10-CM

## 2018-10-27 NOTE — Progress Notes (Signed)
  Postoperative Follow-up Patient presents post op from operative hysteroscopy for abnormal uterine bleeding, 2 weeks ago. Path: DIAGNOSIS:  A. ENDOCERVIX; CURETTAGE:  - PORTIONS OF BENIGN ENDO- AND ECTOCERVICAL CERVICAL TISSUE.  - NEGATIVE FOR DYSPLASIA AND MALIGNANCY.   B. ENDOMETRIUM; CURETTAGE:  - POLYPOID FRAGMENT OF BENIGN ENDOMETRIAL TISSUE WITH PROLIFERATIVE TYPE  CHANGES.  - NEGATIVE FOR ATYPICAL HYPERPLASIA/EIN AND MALIGNANCY Subjective: Patient reports marked improvement in her preop symptoms. Eating a regular diet without difficulty. The patient is not having any pain.  Activity: normal activities of daily living. Patient reports additional symptom's since surgery of No further bleeding.  Objective: BP 120/80   Ht 5\' 6"  (1.676 m)   Wt 156 lb (70.8 kg)   BMI 25.18 kg/m  Physical Exam Constitutional:      General: She is not in acute distress.    Appearance: She is well-developed.  Musculoskeletal: Normal range of motion.  Neurological:     Mental Status: She is alert and oriented to person, place, and time.  Skin:    General: Skin is warm and dry.  Vitals signs reviewed.     Assessment: s/p :  operative hysteroscopy progressing well  Plan: Patient has done well after surgery with no apparent complications.  I have discussed the post-operative course to date, and the expected progress moving forward.  The patient understands what complications to be concerned about.  I will see the patient in routine follow up, or sooner if needed.    Activity plan: No restriction. Monitor for bleeding concerns moving forward Plan [place] f/u of left ovarian cyst 3 mos Annual 3 mos  Hoyt Koch 10/27/2018, 4:33 PM

## 2018-10-31 NOTE — Progress Notes (Signed)
Patient: Janice Donovan Female    DOB: 1965/06/13   53 y.o.   MRN: [id number] Visit Date: 11/02/2018  Today's Provider: Mar Daring, PA-C   Chief Complaint  Patient presents with   Follow-up   Subjective:     HPI   Patient is here for follow up labs and asthma. She has been going to GYN for paps and other care. Recently had D&C 3 weeks ago with Dr. Kenton Kingfisher. Otherwise she has no complaints and feels stable on current medications.   Allergies  Allergen Reactions   Erythromycin Swelling   Bee Venom      Current Outpatient Medications:    buPROPion (WELLBUTRIN XL) 150 MG 24 hr tablet, TAKE 1 TABLET BY MOUTH  DAILY (Patient taking differently: Take 150 mg by mouth daily. ), Disp: 90 tablet, Rfl: 3   fexofenadine (ALLEGRA) 180 MG tablet, Take 180 mg by mouth every evening. , Disp: , Rfl:    fluticasone (FLONASE) 50 MCG/ACT nasal spray, USE 2 SPRAYS IN EACH  NOSTRIL DAILY, Disp: 48 g, Rfl: 1   ibuprofen (ADVIL) 200 MG tablet, Take 800 mg by mouth every 8 (eight) hours as needed (for pain.)., Disp: , Rfl:    montelukast (SINGULAIR) 10 MG tablet, Take 10 mg by mouth at bedtime. , Disp: , Rfl:    Multiple Vitamin (MULTIVITAMIN WITH MINERALS) TABS tablet, Take 1 tablet by mouth every evening., Disp: , Rfl:    pantoprazole (PROTONIX) 40 MG tablet, Take 40 mg by mouth 2 (two) times daily. , Disp: , Rfl:    PROAIR HFA 108 (90 Base) MCG/ACT inhaler, Inhale 2 puffs into the lungs every 4 (four) hours as needed., Disp: 18 g, Rfl: 5   senna-docusate (SENOKOT-S) 8.6-50 MG per tablet, Take 2 tablets by mouth 2 (two) times daily. , Disp: , Rfl:    SRONYX 0.1-20 MG-MCG tablet, TAKE 1 TABLET BY MOUTH  DAILY (Patient not taking: Reported on 11/02/2018), Disp: 84 tablet, Rfl: 0  Review of Systems  Constitutional: Negative for appetite change, chills, fatigue and fever.  HENT: Negative.   Eyes: Negative.   Respiratory: Negative for chest tightness and shortness of breath.     Cardiovascular: Negative for chest pain and palpitations.  Gastrointestinal: Negative for abdominal pain, nausea and vomiting.  Endocrine: Negative.   Genitourinary: Negative.   Musculoskeletal: Negative.   Skin: Negative.   Allergic/Immunologic: Negative.   Neurological: Negative for dizziness and weakness.  Hematological: Negative.   Psychiatric/Behavioral: Negative.     Social History   Tobacco Use   Smoking status: Never Smoker   Smokeless tobacco: Never Used  Substance Use Topics   Alcohol use: No    Alcohol/week: 0.0 standard drinks      Objective:   BP 112/70 (BP Location: Left Arm, Patient Position: Sitting, Cuff Size: Large)    Pulse 72    Temp (!) 96.8 F (36 C) (Other (Comment))    Resp 16    Wt 155 lb (70.3 kg)    SpO2 98%    BMI 25.02 kg/m  Vitals:   11/02/18 0837  BP: 112/70  Pulse: 72  Resp: 16  Temp: (!) 96.8 F (36 C)  TempSrc: Other (Comment)  SpO2: 98%  Weight: 155 lb (70.3 kg)  Body mass index is 25.02 kg/m.   Physical Exam Vitals signs reviewed.  Constitutional:      General: She is not in acute distress.    Appearance: Normal appearance. She is  well-developed. She is not ill-appearing or diaphoretic.  HENT:     Head: Normocephalic and atraumatic.     Right Ear: Tympanic membrane, ear canal and external ear normal.     Left Ear: Tympanic membrane, ear canal and external ear normal.     Nose: Nose normal.     Mouth/Throat:     Mouth: Mucous membranes are moist.     Pharynx: No oropharyngeal exudate.  Eyes:     General: No scleral icterus.       Right eye: No discharge.        Left eye: No discharge.     Extraocular Movements: Extraocular movements intact.     Conjunctiva/sclera: Conjunctivae normal.     Pupils: Pupils are equal, round, and reactive to light.  Neck:     Musculoskeletal: Normal range of motion and neck supple.     Thyroid: No thyromegaly.     Vascular: No carotid bruit or JVD.     Trachea: No tracheal deviation.   Cardiovascular:     Rate and Rhythm: Normal rate and regular rhythm.     Pulses: Normal pulses.     Heart sounds: Normal heart sounds. No murmur. No friction rub. No gallop.   Pulmonary:     Effort: Pulmonary effort is normal. No respiratory distress.     Breath sounds: Normal breath sounds. No wheezing or rales.  Chest:     Chest wall: No tenderness.  Abdominal:     General: Abdomen is flat. Bowel sounds are normal. There is no distension.     Palpations: Abdomen is soft. There is no mass.     Tenderness: There is no abdominal tenderness. There is no guarding or rebound.  Musculoskeletal: Normal range of motion.        General: No tenderness.     Right lower leg: No edema.     Left lower leg: No edema.  Lymphadenopathy:     Cervical: No cervical adenopathy.  Skin:    General: Skin is warm and dry.     Capillary Refill: Capillary refill takes less than 2 seconds.     Findings: No rash.  Neurological:     General: No focal deficit present.     Mental Status: She is alert and oriented to person, place, and time. Mental status is at baseline.  Psychiatric:        Mood and Affect: Mood normal.        Behavior: Behavior normal.        Thought Content: Thought content normal.        Judgment: Judgment normal.      No results found for any visits on 11/02/18.     Assessment & Plan    1. Depression, major, single episode, complete remission (HCC) Stable. Diagnosis pulled for medication refill. Continue current medical treatment plan. Will check labs as below and f/u pending results. - CBC w/Diff/Platelet - Comprehensive Metabolic Panel (CMET) - TSH - Lipid Profile - HgB A1c - buPROPion (WELLBUTRIN XL) 150 MG 24 hr tablet; Take 1 tablet (150 mg total) by mouth daily.  Dispense: 90 tablet; Refill: 3  2. Allergic rhinitis Stable. Diagnosis pulled for medication refill. Continue current medical treatment plan. Will check labs as below and f/u pending results. - CBC  w/Diff/Platelet - Comprehensive Metabolic Panel (CMET) - TSH - Lipid Profile - HgB A1c - fluticasone (FLONASE) 50 MCG/ACT nasal spray; Place 2 sprays into both nostrils daily.  Dispense: 48 g; Refill: 1 -  fexofenadine (ALLEGRA) 180 MG tablet; Take 1 tablet (180 mg total) by mouth every evening.  Dispense: 90 tablet; Refill: 1 - montelukast (SINGULAIR) 10 MG tablet; Take 1 tablet (10 mg total) by mouth at bedtime.  Dispense: 90 tablet; Refill: 1  3. Mild intermittent asthma without complication Stable. Diagnosis pulled for medication refill. Continue current medical treatment plan. Will check labs as below and f/u pending results. - CBC w/Diff/Platelet - Comprehensive Metabolic Panel (CMET) - TSH - Lipid Profile - HgB A1c - montelukast (SINGULAIR) 10 MG tablet; Take 1 tablet (10 mg total) by mouth at bedtime.  Dispense: 90 tablet; Refill: 1 - PROAIR HFA 108 (90 Base) MCG/ACT inhaler; Inhale 2 puffs into the lungs every 4 (four) hours as needed.  Dispense: 18 g; Refill: 5  4. Avitaminosis D H/O this. Will check labs as below and f/u pending results. - CBC w/Diff/Platelet - Comprehensive Metabolic Panel (CMET) - TSH - Lipid Profile - HgB A1c - Vitamin D (25 hydroxy)  5. Gastroesophageal reflux disease with esophagitis Stable. Diagnosis pulled for medication refill. Continue current medical treatment plan. Will check labs as below and f/u pending results. - CBC w/Diff/Platelet - Comprehensive Metabolic Panel (CMET) - TSH - Lipid Profile - HgB A1c - pantoprazole (PROTONIX) 40 MG tablet; Take 1 tablet (40 mg total) by mouth 2 (two) times daily.  Dispense: 180 tablet; Refill: 1  6. Need for influenza vaccination Flu vaccine given today without complication. Patient sat upright for 15 minutes to check for adverse reaction before being released. - Flu Vaccine QUAD 36+ mos IM     Mar Daring, PA-C  [facility]

## 2018-11-02 ENCOUNTER — Ambulatory Visit: Payer: BLUE CROSS/BLUE SHIELD | Admitting: Physician Assistant

## 2018-11-02 ENCOUNTER — Ambulatory Visit: Payer: 59 | Admitting: Physician Assistant

## 2018-11-02 ENCOUNTER — Other Ambulatory Visit: Payer: Self-pay

## 2018-11-02 ENCOUNTER — Encounter: Payer: Self-pay | Admitting: Physician Assistant

## 2018-11-02 VITALS — BP 112/70 | HR 72 | Temp 96.8°F | Resp 16 | Wt 155.0 lb

## 2018-11-02 DIAGNOSIS — F325 Major depressive disorder, single episode, in full remission: Secondary | ICD-10-CM

## 2018-11-02 DIAGNOSIS — J4520 Mild intermittent asthma, uncomplicated: Secondary | ICD-10-CM

## 2018-11-02 DIAGNOSIS — K2100 Gastro-esophageal reflux disease with esophagitis, without bleeding: Secondary | ICD-10-CM

## 2018-11-02 DIAGNOSIS — J301 Allergic rhinitis due to pollen: Secondary | ICD-10-CM | POA: Diagnosis not present

## 2018-11-02 DIAGNOSIS — Z23 Encounter for immunization: Secondary | ICD-10-CM

## 2018-11-02 DIAGNOSIS — E559 Vitamin D deficiency, unspecified: Secondary | ICD-10-CM | POA: Diagnosis not present

## 2018-11-02 MED ORDER — MONTELUKAST SODIUM 10 MG PO TABS: 10.0000 mg | ORAL_TABLET | Freq: Every day | ORAL | 1 refills | Status: DC

## 2018-11-02 MED ORDER — FLUTICASONE PROPIONATE 50 MCG/ACT NA SUSP: 2.0000 | Freq: Every day | NASAL | 1 refills | Status: DC

## 2018-11-02 MED ORDER — PANTOPRAZOLE SODIUM 40 MG PO TBEC: 40.0000 mg | DELAYED_RELEASE_TABLET | Freq: Two times a day (BID) | ORAL | 1 refills | Status: DC

## 2018-11-02 MED ORDER — BUPROPION HCL ER (XL) 150 MG PO TB24: 150.0000 mg | ORAL_TABLET | Freq: Every day | ORAL | 3 refills | Status: DC

## 2018-11-02 MED ORDER — PROAIR HFA 108 (90 BASE) MCG/ACT IN AERS: 2.0000 | INHALATION_SPRAY | RESPIRATORY_TRACT | 5 refills | Status: DC | PRN

## 2018-11-02 MED ORDER — FEXOFENADINE HCL 180 MG PO TABS: 180.0000 mg | ORAL_TABLET | Freq: Every evening | ORAL | 1 refills | Status: DC

## 2018-11-02 NOTE — Patient Instructions (Signed)
Health Maintenance, Female Adopting a healthy lifestyle and getting preventive care are important in promoting health and wellness. Ask your health care provider about:  The right schedule for you to have regular tests and exams.  Things you can do on your own to prevent diseases and keep yourself healthy. What should I know about diet, weight, and exercise? Eat a healthy diet   Eat a diet that includes plenty of vegetables, fruits, low-fat dairy products, and lean protein.  Do not eat a lot of foods that are high in solid fats, added sugars, or sodium. Maintain a healthy weight Body mass index (BMI) is used to identify weight problems. It estimates body fat based on height and weight. Your health care provider can help determine your BMI and help you achieve or maintain a healthy weight. Get regular exercise Get regular exercise. This is one of the most important things you can do for your health. Most adults should:  Exercise for at least 150 minutes each week. The exercise should increase your heart rate and make you sweat (moderate-intensity exercise).  Do strengthening exercises at least twice a week. This is in addition to the moderate-intensity exercise.  Spend less time sitting. Even light physical activity can be beneficial. Watch cholesterol and blood lipids Have your blood tested for lipids and cholesterol at 53 years of age, then have this test every 5 years. Have your cholesterol levels checked more often if:  Your lipid or cholesterol levels are high.  You are older than 53 years of age.  You are at high risk for heart disease. What should I know about cancer screening? Depending on your health history and family history, you may need to have cancer screening at various ages. This may include screening for:  Breast cancer.  Cervical cancer.  Colorectal cancer.  Skin cancer.  Lung cancer. What should I know about heart disease, diabetes, and high blood  pressure? Blood pressure and heart disease  High blood pressure causes heart disease and increases the risk of stroke. This is more likely to develop in people who have high blood pressure readings, are of African descent, or are overweight.  Have your blood pressure checked: ? Every 3-5 years if you are 18-39 years of age. ? Every year if you are 40 years old or older. Diabetes Have regular diabetes screenings. This checks your fasting blood sugar level. Have the screening done:  Once every three years after age 40 if you are at a normal weight and have a low risk for diabetes.  More often and at a younger age if you are overweight or have a high risk for diabetes. What should I know about preventing infection? Hepatitis B If you have a higher risk for hepatitis B, you should be screened for this virus. Talk with your health care provider to find out if you are at risk for hepatitis B infection. Hepatitis C Testing is recommended for:  Everyone born from 1945 through 1965.  Anyone with known risk factors for hepatitis C. Sexually transmitted infections (STIs)  Get screened for STIs, including gonorrhea and chlamydia, if: ? You are sexually active and are younger than 53 years of age. ? You are older than 53 years of age and your health care provider tells you that you are at risk for this type of infection. ? Your sexual activity has changed since you were last screened, and you are at increased risk for chlamydia or gonorrhea. Ask your health care provider if   you are at risk.  Ask your health care provider about whether you are at high risk for HIV. Your health care provider may recommend a prescription medicine to help prevent HIV infection. If you choose to take medicine to prevent HIV, you should first get tested for HIV. You should then be tested every 3 months for as long as you are taking the medicine. Pregnancy  If you are about to stop having your period (premenopausal) and  you may become pregnant, seek counseling before you get pregnant.  Take 400 to 800 micrograms (mcg) of folic acid every day if you become pregnant.  Ask for birth control (contraception) if you want to prevent pregnancy. Osteoporosis and menopause Osteoporosis is a disease in which the bones lose minerals and strength with aging. This can result in bone fractures. If you are 65 years old or older, or if you are at risk for osteoporosis and fractures, ask your health care provider if you should:  Be screened for bone loss.  Take a calcium or vitamin D supplement to lower your risk of fractures.  Be given hormone replacement therapy (HRT) to treat symptoms of menopause. Follow these instructions at home: Lifestyle  Do not use any products that contain nicotine or tobacco, such as cigarettes, e-cigarettes, and chewing tobacco. If you need help quitting, ask your health care provider.  Do not use street drugs.  Do not share needles.  Ask your health care provider for help if you need support or information about quitting drugs. Alcohol use  Do not drink alcohol if: ? Your health care provider tells you not to drink. ? You are pregnant, may be pregnant, or are planning to become pregnant.  If you drink alcohol: ? Limit how much you use to 0-1 drink a day. ? Limit intake if you are breastfeeding.  Be aware of how much alcohol is in your drink. In the [place] one drink equals one 12 oz bottle of beer (355 mL), one 5 oz glass of wine (148 mL), or one 1 oz glass of hard liquor (44 mL). General instructions  Schedule regular health, dental, and eye exams.  Stay current with your vaccines.  Tell your health care provider if: ? You often feel depressed. ? You have ever been abused or do not feel safe at home. Summary  Adopting a healthy lifestyle and getting preventive care are important in promoting health and wellness.  Follow your health care provider's instructions about healthy  diet, exercising, and getting tested or screened for diseases.  Follow your health care provider's instructions on monitoring your cholesterol and blood pressure. This information is not intended to replace advice given to you by your health care provider. Make sure you discuss any questions you have with your health care provider. Document Released: 08/11/2010 Document Revised: 01/19/2018 Document Reviewed: 01/19/2018 Elsevier Patient Education  2020 [employer]  

## 2018-11-03 LAB — COMPREHENSIVE METABOLIC PANEL
ALT: 23 IU/L (ref 0–32)
AST: 21 IU/L (ref 0–40)
Albumin/Globulin Ratio: 2.1 (ref 1.2–2.2)
Albumin: 4.8 g/dL (ref 3.8–4.9)
Alkaline Phosphatase: 93 IU/L (ref 39–117)
BUN/Creatinine Ratio: 28 — ABNORMAL HIGH (ref 9–23)
BUN: 19 mg/dL (ref 6–24)
Bilirubin Total: 0.4 mg/dL (ref 0.0–1.2)
CO2: 26 mmol/L (ref 20–29)
Calcium: 9.9 mg/dL (ref 8.7–10.2)
Chloride: 103 mmol/L (ref 96–106)
Creatinine, Ser: 0.68 mg/dL (ref 0.57–1.00)
GFR calc Af Amer: 116 mL/min/{1.73_m2} (ref >59)
GFR calc non Af Amer: 101 mL/min/{1.73_m2} (ref >59)
Globulin, Total: 2.3 g/dL (ref 1.5–4.5)
Glucose: 101 mg/dL — ABNORMAL HIGH (ref 65–99)
Potassium: 4.8 mmol/L (ref 3.5–5.2)
Sodium: 141 mmol/L (ref 134–144)
Total Protein: 7.1 g/dL (ref 6.0–8.5)

## 2018-11-03 LAB — CBC WITH DIFFERENTIAL/PLATELET
Basophils Absolute: 0 10*3/uL (ref 0.0–0.2)
Basos: 1 %
EOS (ABSOLUTE): 0.1 10*3/uL (ref 0.0–0.4)
Eos: 2 %
Hematocrit: 41.5 % (ref 34.0–46.6)
Hemoglobin: 13.7 g/dL (ref 11.1–15.9)
Immature Grans (Abs): 0 10*3/uL (ref 0.0–0.1)
Immature Granulocytes: 0 %
Lymphocytes Absolute: 1.4 10*3/uL (ref 0.7–3.1)
Lymphs: 28 %
MCH: 30.9 pg (ref 26.6–33.0)
MCHC: 33 g/dL (ref 31.5–35.7)
MCV: 94 fL (ref 79–97)
Monocytes Absolute: 0.4 10*3/uL (ref 0.1–0.9)
Monocytes: 8 %
Neutrophils Absolute: 2.9 10*3/uL (ref 1.4–7.0)
Neutrophils: 61 %
Platelets: 194 10*3/uL (ref 150–450)
RBC: 4.43 x10E6/uL (ref 3.77–5.28)
RDW: 11.9 % (ref 11.7–15.4)
WBC: 4.8 10*3/uL (ref 3.4–10.8)

## 2018-11-03 LAB — HEMOGLOBIN A1C
Est. average glucose Bld gHb Est-mCnc: 108 mg/dL
Hgb A1c MFr Bld: 5.4 % (ref 4.8–5.6)

## 2018-11-03 LAB — VITAMIN D 25 HYDROXY (VIT D DEFICIENCY, FRACTURES): Vit D, 25-Hydroxy: 38.9 ng/mL (ref 30.0–100.0)

## 2018-11-03 LAB — LIPID PANEL
Chol/HDL Ratio: 2.7 ratio (ref 0.0–4.4)
Cholesterol, Total: 264 mg/dL — ABNORMAL HIGH (ref 100–199)
HDL: 99 mg/dL (ref >39)
LDL Chol Calc (NIH): 153 mg/dL — ABNORMAL HIGH (ref 0–99)
Triglycerides: 74 mg/dL (ref 0–149)
VLDL Cholesterol Cal: 12 mg/dL (ref 5–40)

## 2018-11-03 LAB — TSH: TSH: 1.15 u[IU]/mL (ref 0.450–4.500)

## 2018-11-04 ENCOUNTER — Telehealth: Payer: Self-pay

## 2018-11-04 NOTE — Telephone Encounter (Signed)
Pt advised.   Thanks,   -Laura  

## 2018-11-04 NOTE — Telephone Encounter (Signed)
-----   Message from Mar Daring, [place] sent at 11/03/2018  5:11 PM EDT ----- Blood count is normal. Kidney and liver enzymes are normal. Sodium, potassium and calcium are normal. Thyroid is normal. Cholesterol is elevated but your HDL is very high which offers cardioprotection. Currently your risk of a cardiovascular event over the next 10 years is very low at 0.8%. No need for cholesterol lowering medication. A1c/sugar is normal. Vit D is normal.

## 2018-12-15 ENCOUNTER — Other Ambulatory Visit: Payer: Self-pay | Admitting: Certified Nurse Midwife

## 2019-01-02 ENCOUNTER — Other Ambulatory Visit: Payer: Self-pay | Admitting: Certified Nurse Midwife

## 2019-01-02 DIAGNOSIS — Z1231 Encounter for screening mammogram for malignant neoplasm of breast: Secondary | ICD-10-CM

## 2019-01-17 ENCOUNTER — Ambulatory Visit
Admission: RE | Admit: 2019-01-17 | Discharge: 2019-01-17 | Disposition: A | Discharge status: Home / Self Care | Payer: 59 | Source: Ambulatory Visit | Attending: Certified Nurse Midwife | Admitting: Certified Nurse Midwife

## 2019-01-17 DIAGNOSIS — Z1231 Encounter for screening mammogram for malignant neoplasm of breast: Secondary | ICD-10-CM | POA: Diagnosis present

## 2019-01-19 NOTE — Progress Notes (Signed)
/     Gynecology Annual Exam  PCP: Mar Daring, PA-C  Chief Complaint:  Chief Complaint  Patient presents with  . Gynecologic Exam    order for u/s at outside facility    History of Present Illness:Janice Donovan is a 53 year old Caucasian/White female, [place], who presents for her annual exam.  She has had no bleeding since her hysteroscopy, D&C, and polypectomy on  10/13/2018. She has occasional hot flashes.   The patient's past medical history is notable for a history of asthma, allergies, GERD, constipation, and a mood disorder. She is doing well on her Wellbutrin. She had a pelvic ultrasound 09/07/18 when undergoing a work up for AUB and it revealed a complex 3.7 cm left ovarian cyst. She needs a follow up pelvic ultrasound scheduled. She also had an [place] and LH in menopausal range  She has a history of having a LEEP 12/06/2008 for a endocervical polyp and path returned HPV. Pap smears since then have been normal. Her last Pap smear 01/13/2018 was NIL. She is sexually active. She uses condoms for contraception.  Her most recent mammogram obtained 01/17/2019 and it was negative. There is no family history of breast cancer.  There is no family history of ovarian cancer.  The patient does occ self breast exams.  She had a colonoscopy in in 2016 that was normal. Her next colonoscopy is due in 10 years.  A DEXA scan is not applicable for this patient.  The patient does not smoke.  The patient does not drink alcohol.  The patient does not use illegal drugs.  The patient exercises  by walking most days for 20-25 minutes The patient gets adequate calcium with her diet and supplements She had a recent cholesterol screen in 2020 that was elevated with a TC of 264 and LDL of 153 and HDL of 99  .    Review of Systems: Review of Systems  Constitutional: Negative for chills, fever and weight loss.  HENT: Negative for congestion, sinus pain and sore throat.   Eyes: Negative for  blurred vision and pain.  Respiratory: Negative for hemoptysis, shortness of breath and wheezing.   Cardiovascular: Negative for chest pain and leg swelling.  Gastrointestinal: Negative for abdominal pain, blood in stool, constipation, diarrhea, heartburn, nausea and vomiting.  Genitourinary: Negative for dysuria, frequency, hematuria and urgency.  Musculoskeletal: Negative for back pain, joint pain and myalgias.  Skin: Negative for itching and rash.  Neurological: Negative for dizziness, tingling and headaches.  Endo/Heme/Allergies: Negative for environmental allergies and polydipsia. Does not bruise/bleed easily.       Negative for hirsutism   Psychiatric/Behavioral: Negative for depression. The patient is not nervous/anxious and does not have insomnia.     Past Medical History:  Past Medical History:  Diagnosis Date  . Adrenal nodule ([place])   . Allergic rhinitis   . Asthma   . Cervical polyp   . Constipation   . Generalized abdominal pain   . GERD (gastroesophageal reflux disease)   . History of mammogram 2015;01/02/15; 01/06/16   birads 1; birads 1; neg  . History of Papanicolaou smear of cervix 2011;03/10/15   neg; neg  . IBS (irritable bowel syndrome)   . Mood disorder ([place])   . Obesity   . Plantar fasciitis of left foot 09/29/2012  . Renal lesion   . Vaginal birth after cesarean 1993    Past Surgical History:  Past Surgical History:  Procedure Laterality Date  .  ANTERIOR CERVICAL DECOMP/DISCECTOMY FUSION  2005   C4-6  . CESAREAN SECTION  1990  . colonoscopy with polypectomy  06/2007; 2016   abd pain and chronic coinstipation  . DIAGNOSTIC LAPAROSCOPY     normal pelvis  . ESOPHAGOGASTRODUODENOSCOPY  06/2007; 2016   gastric polyps  . HYSTEROSCOPY W/D&C N/A 10/13/2018   Procedure: DILATATION AND CURETTAGE /HYSTEROSCOPY;  Surgeon: Gae Dry, MD;  Location: [facility];  Service: Gynecology;  Laterality: N/A;  . LEEP  12/06/2008   cx polyp removal-path was HPV  changes - CAK    Family History:  Family History  Problem Relation Age of Onset  . Diabetes Father   . Hypertension Father   . Benign prostatic hyperplasia Father   . Bladder Cancer Father   . Hyperlipidemia Father   . Dementia Father        Lewy Body Dementia  . Asthma Father   . Melanoma Father   . COPD Mother   . Heart disease Mother   . Stroke Mother   . Hypertension Mother   . Diabetes Sister   . Hyperlipidemia Brother   . Hypertension Brother   . Cancer Maternal Grandmother 24       pancreatic  . Prostate cancer Neg Hx   . Kidney disease Neg Hx   . Breast cancer Neg Hx     Social History:  Social History   Socioeconomic History  . Marital status: Married    Spouse name: Not on file  . Number of children: 2  . Years of education: H/S  . Highest education level: Not on file  Occupational History  . Occupation: Programme researcher, broadcasting/film/video: [employer]  Tobacco Use  . Smoking status: Never Smoker  . Smokeless tobacco: Never Used  Substance and Sexual Activity  . Alcohol use: No    Alcohol/week: 0.0 standard drinks  . Drug use: No  . Sexual activity: Yes    Partners: Male    Birth control/protection: None, Surgical    Comment: ablation  Other Topics Concern  . Not on file  Social History Narrative   Sophi's daughter Apolonio Schneiders has 3 children -ages 45,2,and 74(2018). Her son is a Education officer, museum.   Social Determinants of Health   Financial Resource Strain:   . Difficulty of Paying Living Expenses: Not on file  Food Insecurity:   . Worried About Charity fundraiser in the Last Year: Not on file  . Ran Out of Food in the Last Year: Not on file  Transportation Needs:   . Lack of Transportation (Medical): Not on file  . Lack of Transportation (Non-Medical): Not on file  Physical Activity:   . Days of Exercise per Week: Not on file  . Minutes of Exercise per Session: Not on file  Stress:   . Feeling of Stress : Not on file  Social Connections:   .  Frequency of Communication with Friends and Family: Not on file  . Frequency of Social Gatherings with Friends and Family: Not on file  . Attends Religious Services: Not on file  . Active Member of Clubs or Organizations: Not on file  . Attends Archivist Meetings: Not on file  . Marital Status: Not on file  Intimate Partner Violence:   . Fear of Current or Ex-Partner: Not on file  . Emotionally Abused: Not on file  . Physically Abused: Not on file  . Sexually Abused: Not on file    Allergies:  Allergies  Allergen  Reactions  . Erythromycin Swelling  . Bee Venom     Medications:  Current Outpatient Medications:  .  buPROPion (WELLBUTRIN XL) 150 MG 24 hr tablet, Take 1 tablet (150 mg total) by mouth daily., Disp: 90 tablet, Rfl: 3 .  fexofenadine (ALLEGRA) 180 MG tablet, Take 1 tablet (180 mg total) by mouth every evening., Disp: 90 tablet, Rfl: 1 .  fluticasone (FLONASE) 50 MCG/ACT nasal spray, Place 2 sprays into both nostrils daily., Disp: 48 g, Rfl: 1 .  ibuprofen (ADVIL) 200 MG tablet, Take 800 mg by mouth every 8 (eight) hours as needed (for pain.)., Disp: , Rfl:  .  montelukast (SINGULAIR) 10 MG tablet, Take 1 tablet (10 mg total) by mouth at bedtime., Disp: 90 tablet, Rfl: 1 .  Multiple Vitamin (MULTIVITAMIN WITH MINERALS) TABS tablet, Take 1 tablet by mouth every evening., Disp: , Rfl:  .  pantoprazole (PROTONIX) 40 MG tablet, Take 1 tablet (40 mg total) by mouth 2 (two) times daily., Disp: 180 tablet, Rfl: 1 .  PROAIR HFA 108 (90 Base) MCG/ACT inhaler, Inhale 2 puffs into the lungs every 4 (four) hours as needed., Disp: 18 g, Rfl: 5 .  senna-docusate (SENOKOT-S) 8.6-50 MG per tablet, Take 2 tablets by mouth 2 (two) times daily. , Disp: , Rfl:  Physical Exam Vitals: BP 120/80   Pulse 71   Temp (!) 96.9 F (36.1 C)   Ht 5\' 6"  (1.676 m)   Wt 153 lb (69.4 kg)   LMP 09/23/2018 (Approximate)   BMI 24.69 kg/m   General: WF in NAD HEENT: normocephalic, anicteric  Neck: no thyroid enlargement, no palpable nodules, no cervical lymphadenopathy  Pulmonary: No increased work of breathing, CTAB Cardiovascular: RRR  Breast: Breast symmetrical, no tenderness, no palpable nodules or masses, no skin or nipple retraction present, no nipple discharge.  No axillary, infraclavicular or supraclavicular lymphadenopathy. Abdomen: Soft, non-tender, non-distended.  Umbilicus without lesions.  No hepatomegaly or masses palpable. No evidence of hernia. Genitourinary:  External: Normal external female genitalia.  Normal urethral meatus, normal Bartholin's and Skene's glands.    Vagina: Normal vaginal mucosa, no evidence of prolapse.    Cervix: Grossly normal in appearance, no bleeding, non-tender  Uterus: Anteverted, globular and irregular fundus, mobile, and non-tender  Adnexa: No adnexal masses, non-tender  Rectal: deferred  Lymphatic: no evidence of inguinal lymphadenopathy Extremities: no edema, erythema, or tenderness Neurologic: Grossly intact Psychiatric: mood appropriate, affect full     Assessment: 53 y.o. [id number] annual gyn exam Perimenopausal  Plan:  1) Breast cancer screening - recommend monthly self breast exam and annual screening mammograms. Mammogram is UTD.  2) Colon cancer screen: colonoscopy 2016 is UTD.  3) Cervical cancer screening - Pap was done.   4) Contraception - Needs to use condoms until no menses x 1 year   5) Routine healthcare maintenance including cholesterol and diabetes screening managed by PCP  6) Pelvic ultrasound ordered. Will call with results and follow up.   Dalia Heading, CNM

## 2019-01-20 ENCOUNTER — Ambulatory Visit: Payer: 59

## 2019-01-20 ENCOUNTER — Ambulatory Visit (INDEPENDENT_AMBULATORY_CARE_PROVIDER_SITE_OTHER): Payer: 59 | Admitting: Certified Nurse Midwife

## 2019-01-20 ENCOUNTER — Other Ambulatory Visit: Payer: Self-pay

## 2019-01-20 ENCOUNTER — Encounter: Payer: Self-pay | Admitting: Certified Nurse Midwife

## 2019-01-20 VITALS — BP 120/80 | HR 71 | Temp 96.9°F | Ht 66.0 in | Wt 153.0 lb

## 2019-01-20 DIAGNOSIS — Z124 Encounter for screening for malignant neoplasm of cervix: Secondary | ICD-10-CM

## 2019-01-20 DIAGNOSIS — Z1231 Encounter for screening mammogram for malignant neoplasm of breast: Secondary | ICD-10-CM

## 2019-01-20 DIAGNOSIS — Z01419 Encounter for gynecological examination (general) (routine) without abnormal findings: Secondary | ICD-10-CM | POA: Diagnosis not present

## 2019-01-20 DIAGNOSIS — N83299 Other ovarian cyst, unspecified side: Secondary | ICD-10-CM

## 2019-01-26 LAB — IGP,RFX APTIMA HPV ALL PTH

## 2019-02-07 ENCOUNTER — Ambulatory Visit (INDEPENDENT_AMBULATORY_CARE_PROVIDER_SITE_OTHER): Payer: 59

## 2019-02-07 ENCOUNTER — Ambulatory Visit (INDEPENDENT_AMBULATORY_CARE_PROVIDER_SITE_OTHER): Payer: 59 | Admitting: Certified Nurse Midwife

## 2019-02-07 ENCOUNTER — Encounter: Payer: Self-pay | Admitting: Certified Nurse Midwife

## 2019-02-07 ENCOUNTER — Other Ambulatory Visit: Payer: Self-pay

## 2019-02-07 VITALS — BP 120/60 | Ht 66.0 in | Wt 154.0 lb

## 2019-02-07 DIAGNOSIS — N83202 Unspecified ovarian cyst, left side: Secondary | ICD-10-CM

## 2019-02-07 DIAGNOSIS — N83299 Other ovarian cyst, unspecified side: Secondary | ICD-10-CM | POA: Diagnosis not present

## 2019-02-07 NOTE — Progress Notes (Signed)
  HPI: Janice Donovan is a 53 year old Caucasian/White female, G2 P2002, who presents for a follow up after a pelvic ultrasound today .  She had a pelvic ultrasound 09/07/18 when undergoing a work up for AUB and it revealed a complex 3.7 cm left ovarian cyst. She had a hysteroscopy, D&C, and polypectomy on  10/13/2018 and has had no further bleeding  Ultrasound demonstrates normal findings. There has been resolution of the left ovarian cyst. Endometrium is <1cm. PMHx: She  has a past medical history of Adrenal nodule ([place]), Allergic rhinitis, Asthma, Cervical polyp, Constipation, Generalized abdominal pain, GERD (gastroesophageal reflux disease), History of mammogram (2015;01/02/15; 01/06/16), History of Papanicolaou smear of cervix (2011;03/10/15), IBS (irritable bowel syndrome), Mood disorder ([place]), Obesity, Plantar fasciitis of left foot (09/29/2012), Renal lesion, and Vaginal birth after cesarean (1993). Also,  has a past surgical history that includes colonoscopy with polypectomy (06/2007; 2016); Cesarean section (1990); Anterior cervical decomp/discectomy fusion (2005); Diagnostic laparoscopy; Esophagogastroduodenoscopy (06/2007; 2016); LEEP (12/06/2008); and Hysteroscopy with D & C (N/A, 10/13/2018)., family history includes Asthma in her father; Benign prostatic hyperplasia in her father; Bladder Cancer in her father; COPD in her mother; Cancer (age of onset: 26) in her maternal grandmother; Dementia in her father; Diabetes in her father and sister; Heart disease in her mother; Hyperlipidemia in her brother and father; Hypertension in her brother, father, and mother; Melanoma in her father; Stroke in her mother.,  reports that she has never smoked. She has never used smokeless tobacco. She reports that she does not drink alcohol or use drugs.  She has a current medication list which includes the following prescription(s): bupropion, fexofenadine, fluticasone, ibuprofen, montelukast, multivitamin with  minerals, pantoprazole, proair hfa, and senna-docusate. Also, is allergic to erythromycin and bee venom.  ROS  Objective: BP 120/60   Ht 5\' 6"  (1.676 m)   Wt 154 lb (69.9 kg)   BMI 24.86 kg/m   Physical examination Constitutional NAD, Conversant     Extremities: Moves all appropriately.  Normal ROM for age. No lymphadenopathy.  Neuro: Grossly intact  Psych: Oriented to PPT.  Normal mood. Normal affect.   Assessment:  Resolution of left ovarian cyst  Plan: Follow up for annual exam and prn.  Dalia Heading, CNM

## 2019-02-08 ENCOUNTER — Encounter: Payer: Self-pay | Admitting: Physician Assistant

## 2019-02-13 ENCOUNTER — Ambulatory Visit: Payer: 59 | Attending: Internal Medicine

## 2019-02-13 DIAGNOSIS — Z20822 Contact with and (suspected) exposure to covid-19: Secondary | ICD-10-CM

## 2019-02-14 LAB — NOVEL CORONAVIRUS, NAA: SARS-CoV-2, NAA: NOT DETECTED

## 2019-03-01 ENCOUNTER — Other Ambulatory Visit: Payer: Self-pay | Admitting: Physician Assistant

## 2019-03-01 DIAGNOSIS — K2100 Gastro-esophageal reflux disease with esophagitis, without bleeding: Secondary | ICD-10-CM

## 2019-04-15 ENCOUNTER — Other Ambulatory Visit: Payer: Self-pay | Admitting: Physician Assistant

## 2019-04-15 DIAGNOSIS — J301 Allergic rhinitis due to pollen: Secondary | ICD-10-CM

## 2019-04-15 DIAGNOSIS — J4520 Mild intermittent asthma, uncomplicated: Secondary | ICD-10-CM

## 2019-04-15 NOTE — Telephone Encounter (Signed)
Approved per protocol.  

## 2019-08-05 ENCOUNTER — Other Ambulatory Visit: Payer: Self-pay | Admitting: Physician Assistant

## 2019-08-05 DIAGNOSIS — K2100 Gastro-esophageal reflux disease with esophagitis, without bleeding: Secondary | ICD-10-CM

## 2019-08-05 NOTE — Telephone Encounter (Signed)
Requested Prescriptions  Pending Prescriptions Disp Refills   pantoprazole (PROTONIX) 40 MG tablet [Pharmacy Med Name: PANTOPRAZOLE SOD 40MG  EC TABLET] 180 tablet 0    Sig: TAKE 1 TABLET BY MOUTH  TWICE DAILY     Gastroenterology: Proton Pump Inhibitors Passed - 08/05/2019  9:20 PM      Passed - Valid encounter within last 12 months    Recent Outpatient Visits          9 months ago Depression, major, single episode, complete remission [facility])   [place], [place], [place]   1 year ago Acute non-recurrent pansinusitis   [place], [place]   2 years ago Mild intermittent asthma without complication   [place], [place]   2 years ago Viral upper respiratory tract infection   [place], [place]   2 years ago Upper respiratory tract infection, unspecified type   [facility], [place], [place]

## 2019-09-21 ENCOUNTER — Encounter: Payer: Self-pay | Admitting: Podiatry

## 2019-09-21 ENCOUNTER — Other Ambulatory Visit: Payer: Self-pay

## 2019-09-21 ENCOUNTER — Ambulatory Visit (INDEPENDENT_AMBULATORY_CARE_PROVIDER_SITE_OTHER): Payer: 59 | Admitting: Podiatry

## 2019-09-21 DIAGNOSIS — L603 Nail dystrophy: Secondary | ICD-10-CM | POA: Insufficient documentation

## 2019-09-21 DIAGNOSIS — Q828 Other specified congenital malformations of skin: Secondary | ICD-10-CM

## 2019-09-21 NOTE — Progress Notes (Signed)
This patient presents the office for an evaluation and treatment of her feet.  She says that she is concerned about the thickness that has developed in her 2nd digit toenail both feet.  She says they are thick and there is black discoloration to the nail but no pain associated with the nail.  Patient states she also has multiple calluses on the bottom of both feet but the one on the outside of her right foot is the only one that is painful.  She presents the office today for an evaluation of her callus and thick nails.  Vascular  Dorsalis pedis and posterior tibial pulses are palpable  B/L.  Capillary return  WNL.  Temperature gradient is  WNL.  Skin turgor  WNL  Sensorium  Senn Weinstein monofilament wire  WNL. Normal tactile sensation.  Nail Exam  Patient has normal nails with no evidence of bacterial or fungal infection.  Thickened second toenails  B/L.  Orthopedic  Exam  Muscle tone and muscle strength  WNL.  No limitations of motion feet  B/L.  No crepitus or joint effusion noted.  Foot type is unremarkable and digits show no abnormalities.  Bony prominences are unremarkable.  Skin  No open lesions.  Normal skin texture and turgor.  Plantar tyloma sub 2/3  B/L, Sub 5th  B/L asymptomatic.  Palpable callus sub 4 right foot.  Nail dystrophy second  B/L.  Porokeratosis sub 4 right foot.  ROV.  Discussed thick of nail with this patient.  Told her it appears to be trauma related.  Debrided callus sub 4 right foot with # 15 blade.   RTC prn.   Gardiner Barefoot DPM

## 2019-09-28 ENCOUNTER — Other Ambulatory Visit: Payer: Self-pay | Admitting: Physician Assistant

## 2019-09-28 DIAGNOSIS — J4520 Mild intermittent asthma, uncomplicated: Secondary | ICD-10-CM

## 2019-09-28 DIAGNOSIS — J301 Allergic rhinitis due to pollen: Secondary | ICD-10-CM

## 2019-10-25 ENCOUNTER — Other Ambulatory Visit: Payer: Self-pay | Admitting: Physician Assistant

## 2019-10-25 DIAGNOSIS — K2100 Gastro-esophageal reflux disease with esophagitis, without bleeding: Secondary | ICD-10-CM

## 2019-10-25 NOTE — Telephone Encounter (Signed)
Requested Prescriptions  Pending Prescriptions Disp Refills  . pantoprazole (PROTONIX) 40 MG tablet [Pharmacy Med Name: Pantoprazole Sodium 40 MG Oral Tablet Delayed Release] 180 tablet 0    Sig: TAKE 1 TABLET BY MOUTH  TWICE DAILY     Gastroenterology: Proton Pump Inhibitors Passed - 10/25/2019  9:24 PM      Passed - Valid encounter within last 12 months    Recent Outpatient Visits          11 months ago Depression, major, single episode, complete remission [facility])   [place], [place], [place]   1 year ago Acute non-recurrent pansinusitis   [place], [place]   2 years ago Mild intermittent asthma without complication   [place], [place], [place]   2 years ago Viral upper respiratory tract infection   [place], [place]   2 years ago Upper respiratory tract infection, unspecified type   [facility], [place], [place]

## 2019-11-10 ENCOUNTER — Other Ambulatory Visit: Payer: Self-pay | Admitting: Physician Assistant

## 2019-11-10 DIAGNOSIS — K2100 Gastro-esophageal reflux disease with esophagitis, without bleeding: Secondary | ICD-10-CM

## 2019-12-01 ENCOUNTER — Other Ambulatory Visit: Payer: Self-pay | Admitting: Physician Assistant

## 2019-12-01 DIAGNOSIS — F325 Major depressive disorder, single episode, in full remission: Secondary | ICD-10-CM

## 2019-12-01 NOTE — Telephone Encounter (Signed)
Requested medications are due for refill today yes  Requested medications are on the active medication list yes  Last refill 8/28  Last visit 10/2018  Future visit scheduled no  Notes to clinic Last visit 13 months ago therefore failed protocol of visit within 6 months

## 2019-12-05 IMAGING — MG DIGITAL DIAGNOSTIC UNILATERAL LEFT MAMMOGRAM WITH TOMO AND CAD
6 series · 6 of 18 positions shown · non-contrast
Comparison: Previous exam(s).

CLINICAL DATA: The patient returns after screening study for
evaluation of possible LEFT breast distortion.

EXAM:
DIGITAL DIAGNOSTIC UNILATERAL LEFT MAMMOGRAM WITH CAD AND TOMO

[L MLO synth-2D]
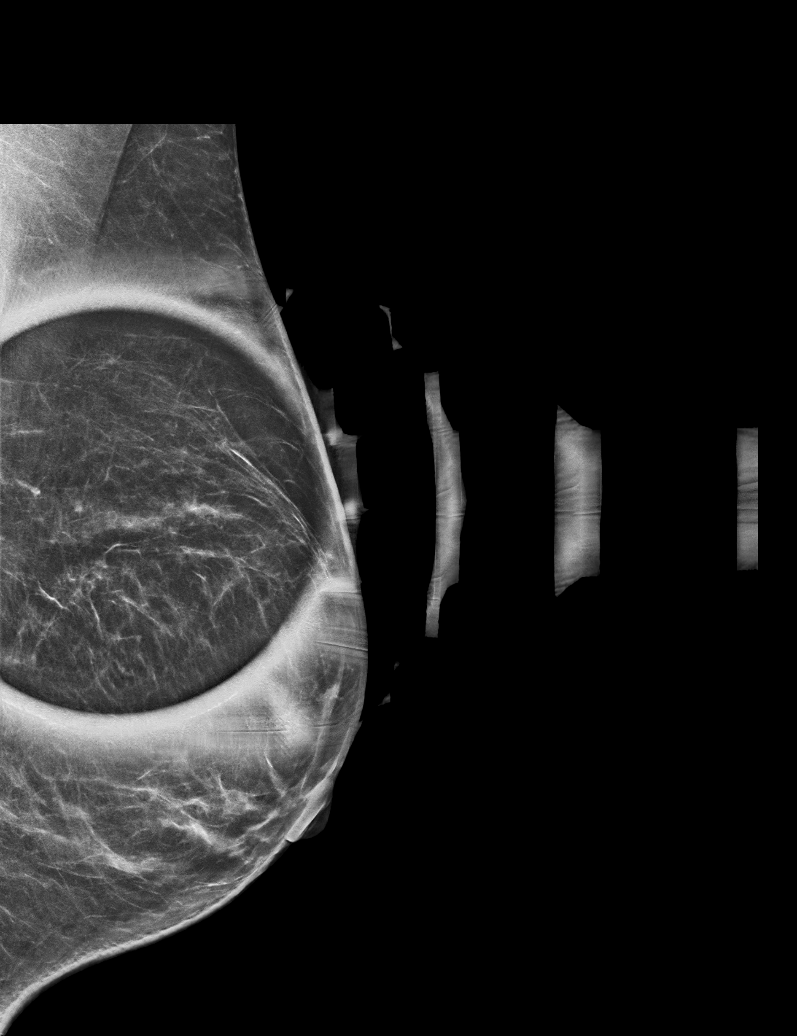

[L CC synth-2D]
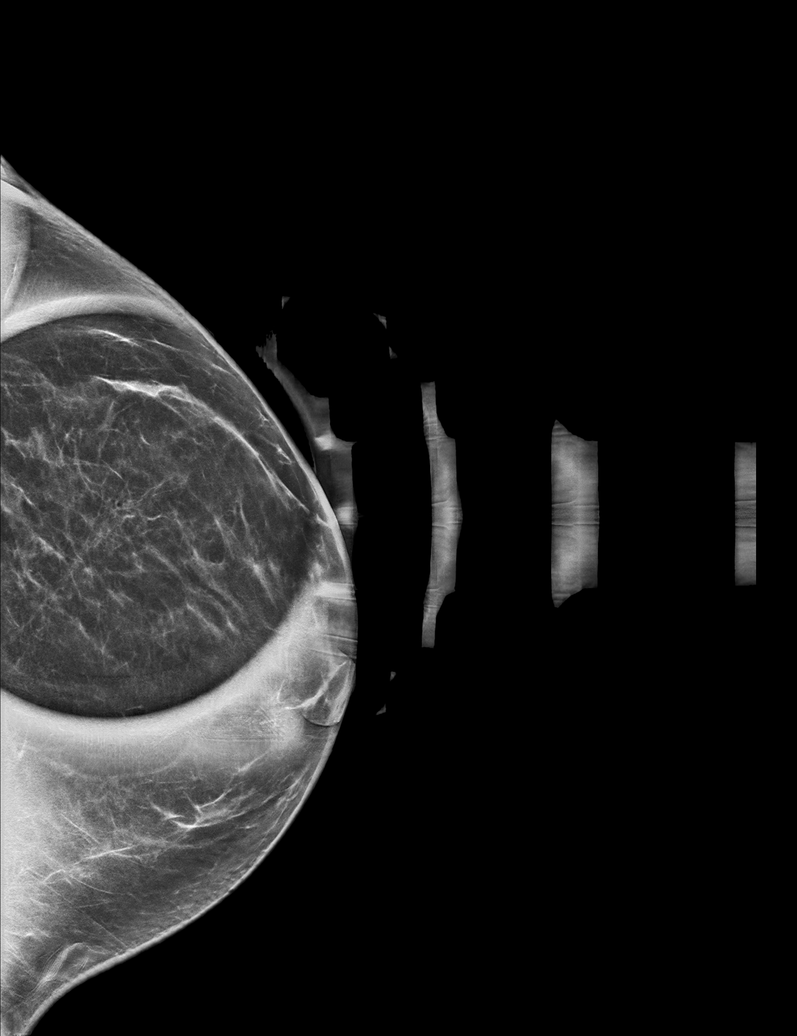

[L ML synth-2D]
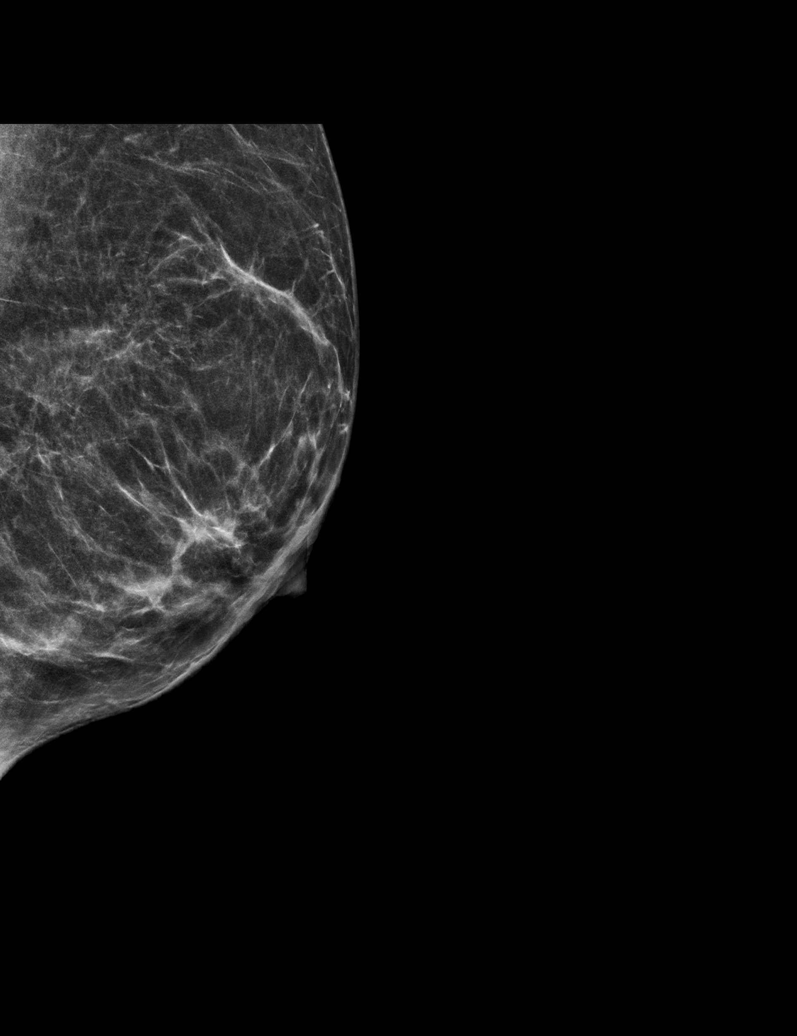

[L CC tomo · tomo slice 29/57.0]
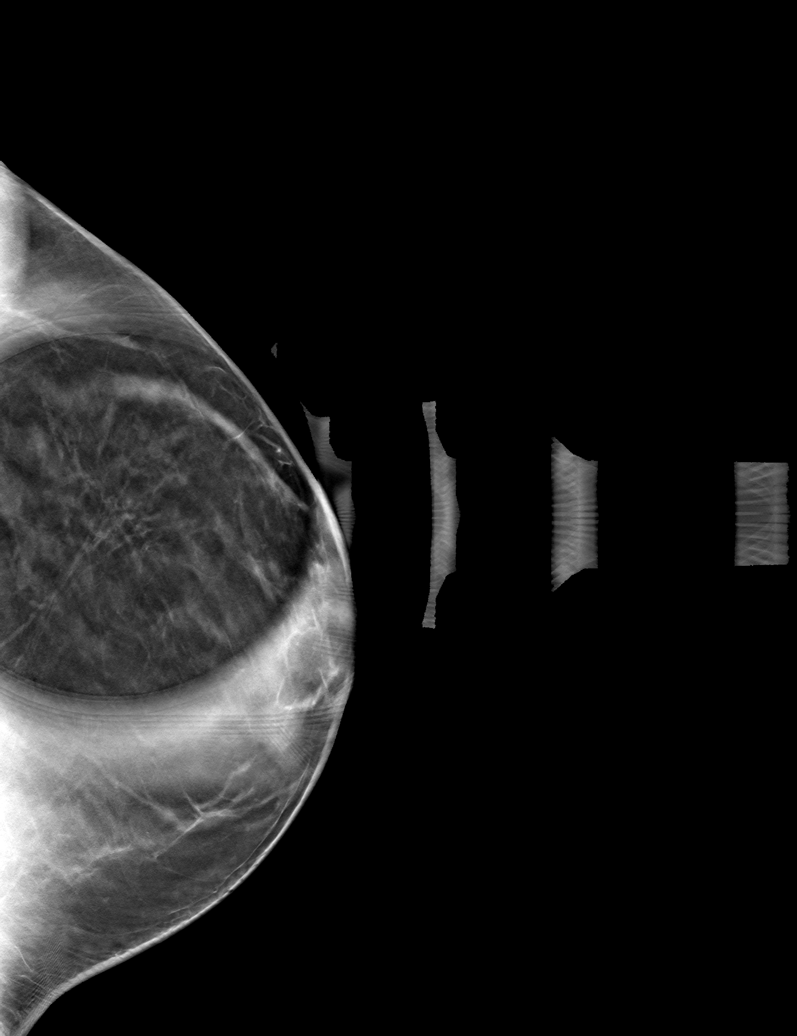

[L MLO tomo · tomo slice 28/55.0]
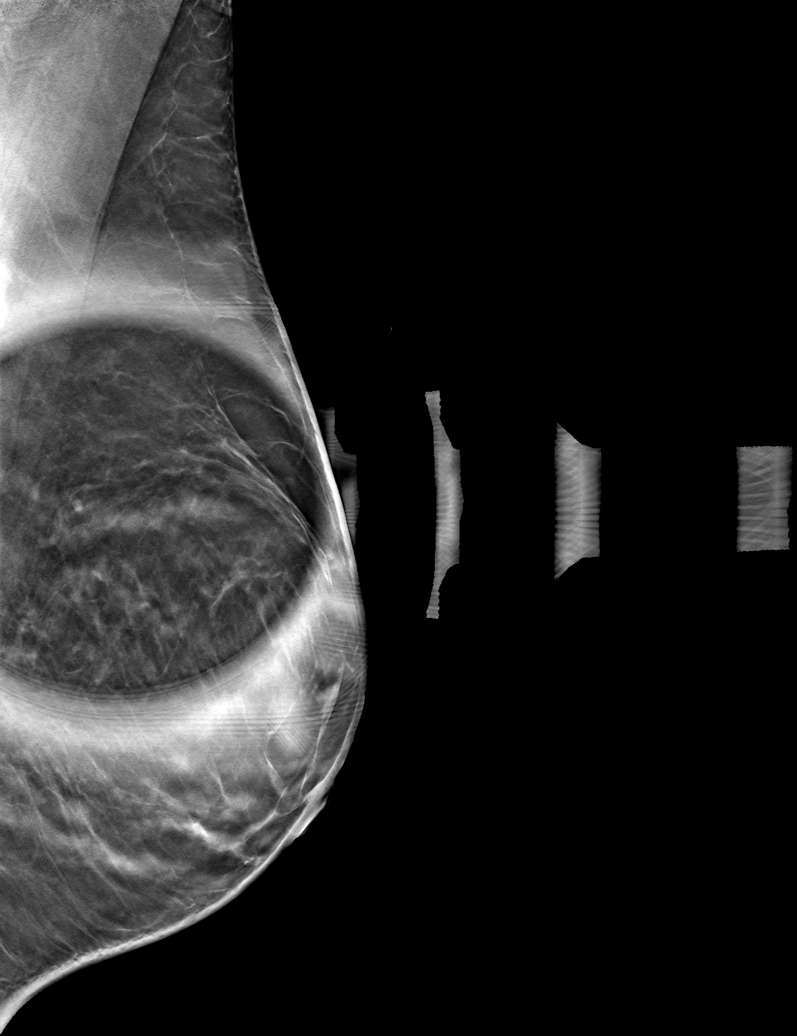

[L ML tomo · tomo slice 29/56.0]
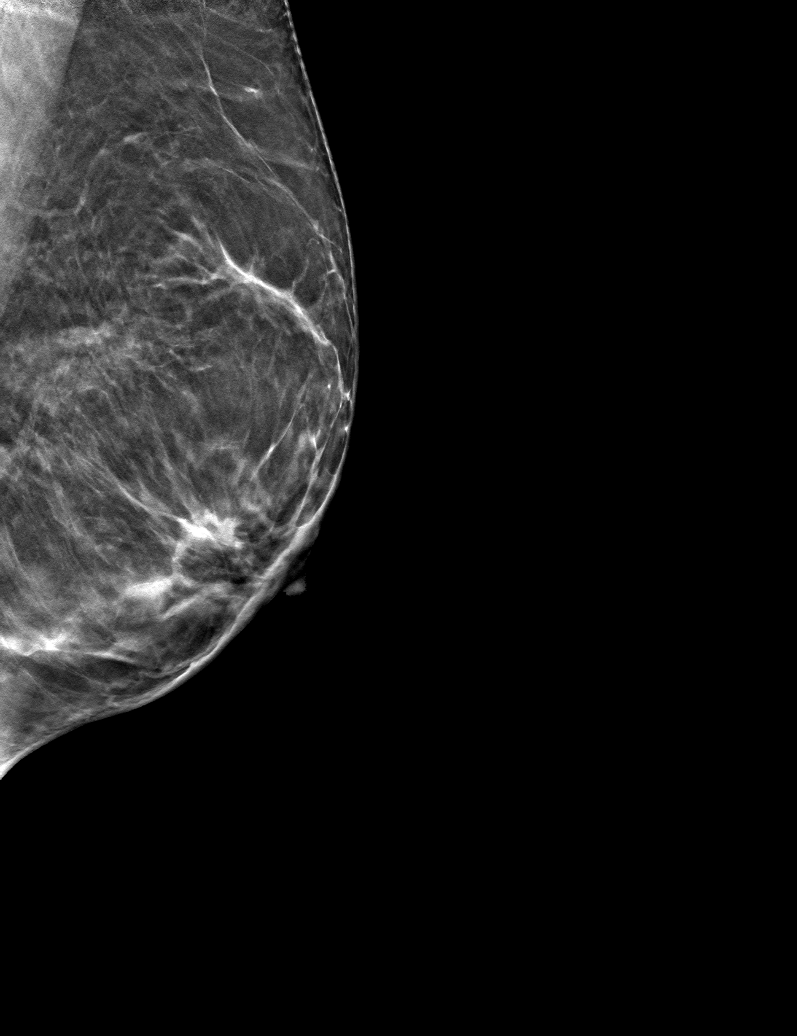

[6 of 18 positions shown; findings below may reference images not displayed]

ACR Breast Density Category b: There are scattered areas of
fibroglandular density.
FINDINGS: Additional 2-D and 3-D images are performed. These views demonstrate
no persistent distortion in the UPPER-OUTER QUADRANT of the LEFT
breast. No suspicious mass or microcalcifications are identified to
suggest presence of malignancy.

Mammographic images were processed with CAD.
IMPRESSION: No mammographic evidence for malignancy.

RECOMMENDATION:
Screening mammogram in one year.(Code:[id number]-[id number])

I have discussed the findings and recommendations with the patient.
Results were also provided in writing at the conclusion of the
visit. If applicable, a reminder letter will be sent to the patient
regarding the next appointment.

BI-RADS CATEGORY  1: Negative.

## 2019-12-19 ENCOUNTER — Other Ambulatory Visit: Payer: Self-pay | Admitting: Obstetrics

## 2019-12-19 ENCOUNTER — Other Ambulatory Visit: Payer: Self-pay | Admitting: Physician Assistant

## 2019-12-19 DIAGNOSIS — J4520 Mild intermittent asthma, uncomplicated: Secondary | ICD-10-CM

## 2019-12-19 DIAGNOSIS — J301 Allergic rhinitis due to pollen: Secondary | ICD-10-CM

## 2019-12-19 NOTE — Telephone Encounter (Signed)
Requested medications are due for refill today?  Yes  Requested medications are on active medication list? Yes  Last Refill:   09/29/2019  # 90 with no refills   Future visit scheduled? No   Notes to Clinic:  Medication failed Rx refill protocol due to no valid encounter in the past 12 months. Last visit was on 11/02/2018.

## 2019-12-20 ENCOUNTER — Other Ambulatory Visit: Payer: Self-pay | Admitting: Obstetrics

## 2019-12-20 DIAGNOSIS — Z01419 Encounter for gynecological examination (general) (routine) without abnormal findings: Secondary | ICD-10-CM

## 2019-12-20 DIAGNOSIS — Z1231 Encounter for screening mammogram for malignant neoplasm of breast: Secondary | ICD-10-CM

## 2019-12-26 ENCOUNTER — Other Ambulatory Visit: Payer: Self-pay | Admitting: Obstetrics

## 2019-12-26 DIAGNOSIS — Z1231 Encounter for screening mammogram for malignant neoplasm of breast: Secondary | ICD-10-CM

## 2020-01-14 ENCOUNTER — Other Ambulatory Visit: Payer: Self-pay | Admitting: Physician Assistant

## 2020-01-14 DIAGNOSIS — F325 Major depressive disorder, single episode, in full remission: Secondary | ICD-10-CM

## 2020-01-15 NOTE — Telephone Encounter (Signed)
Requested medication (s) are due for refill today: no  Requested medication (s) are on the active medication list: yes   Last refill:  12/31/2019  Future visit scheduled:  no  Notes to clinic:  overdue for follow up appt Message left for patient to contact office to schedule    Requested Prescriptions  Pending Prescriptions Disp Refills   buPROPion (WELLBUTRIN XL) 150 MG 24 hr tablet [Pharmacy Med Name: buPROPion HCl ER (XL) 150 MG Oral Tablet Extended Release 24 Hour] 30 tablet 11    Sig: TAKE 1 TABLET BY MOUTH  DAILY . PLEASE SCHEDULE IN  OFFICE VISIT BEFORE ANYMORE REFILLS      Psychiatry: Antidepressants - bupropion Failed - 01/14/2020  9:19 PM      Failed - Valid encounter within last 6 months    Recent Outpatient Visits           1 year ago Depression, major, single episode, complete remission [facility])   [place], [place], [place]   2 years ago Acute non-recurrent pansinusitis   [place], [place]   2 years ago Mild intermittent asthma without complication   [place], [place], [place]   2 years ago Viral upper respiratory tract infection   [place], [place] [address], [place]   3 years ago Upper respiratory tract infection, unspecified type   [place], [place]              Passed - Last BP in normal range    BP Readings from Last 1 Encounters:  02/07/19 120/60

## 2020-01-22 ENCOUNTER — Other Ambulatory Visit: Payer: Self-pay

## 2020-01-22 ENCOUNTER — Ambulatory Visit (INDEPENDENT_AMBULATORY_CARE_PROVIDER_SITE_OTHER): Payer: 59 | Admitting: Physician Assistant

## 2020-01-22 ENCOUNTER — Encounter: Payer: Self-pay | Admitting: Physician Assistant

## 2020-01-22 VITALS — BP 120/71 | HR 67 | Temp 98.4°F | Resp 16 | Ht 66.0 in | Wt 156.2 lb

## 2020-01-22 DIAGNOSIS — M1812 Unilateral primary osteoarthritis of first carpometacarpal joint, left hand: Secondary | ICD-10-CM

## 2020-01-22 DIAGNOSIS — K2100 Gastro-esophageal reflux disease with esophagitis, without bleeding: Secondary | ICD-10-CM | POA: Diagnosis not present

## 2020-01-22 DIAGNOSIS — J301 Allergic rhinitis due to pollen: Secondary | ICD-10-CM | POA: Diagnosis not present

## 2020-01-22 DIAGNOSIS — F325 Major depressive disorder, single episode, in full remission: Secondary | ICD-10-CM

## 2020-01-22 DIAGNOSIS — J4520 Mild intermittent asthma, uncomplicated: Secondary | ICD-10-CM

## 2020-01-22 MED ORDER — BUPROPION HCL ER (XL) 150 MG PO TB24: ORAL_TABLET | ORAL | 3 refills | Status: DC

## 2020-01-22 MED ORDER — PROAIR HFA 108 (90 BASE) MCG/ACT IN AERS: 2.0000 | INHALATION_SPRAY | RESPIRATORY_TRACT | 5 refills | Status: DC | PRN

## 2020-01-22 MED ORDER — MONTELUKAST SODIUM 10 MG PO TABS: 10.0000 mg | ORAL_TABLET | Freq: Every day | ORAL | 3 refills | Status: DC

## 2020-01-22 MED ORDER — FEXOFENADINE HCL 180 MG PO TABS: 180.0000 mg | ORAL_TABLET | Freq: Every evening | ORAL | 3 refills | Status: DC

## 2020-01-22 MED ORDER — PANTOPRAZOLE SODIUM 40 MG PO TBEC: 40.0000 mg | DELAYED_RELEASE_TABLET | Freq: Two times a day (BID) | ORAL | 3 refills | Status: DC

## 2020-01-22 MED ORDER — FLUTICASONE PROPIONATE 50 MCG/ACT NA SUSP
2.0000 | Freq: Every day | NASAL | 3 refills | Status: DC
Start: 2020-01-22 — End: 2020-05-21

## 2020-01-22 NOTE — Progress Notes (Signed)
Established patient visit   Patient: Janice Donovan   DOB: [birth year]   54 y.o. Female  MRN: [id number] Visit Date: 01/22/2020  Today's healthcare provider: Mar Daring, PA-C   Chief Complaint  Patient presents with   Follow-up   Subjective    HPI  Follow up for Anxiety/Depression Patient currently on Bupropion 150mg   She reports excellent compliance with treatment. She feels that condition is Improved. She is not having side effects. Reports that she is stable on this medication.  ----------------------------------------------------------------------------------------- Non seasonal Allergies: currently stable on Montelukast and Flonase.  GERD: stable on Protonix BID.  Patient Active Problem List   Diagnosis Date Noted   Nail dystrophy 09/21/2019   Endometrial polyp 09/22/2018   Abnormal uterine bleeding 09/17/2018   Porokeratosis 08/04/2018   AB (asthmatic bronchitis) 12/11/2014   Airway hyperreactivity 12/11/2014   External hemorrhoid 12/11/2014   Sinusitis, acute 12/11/2014   Contraception management 10/16/2014   Renal cyst 10/15/2014   Allergic rhinitis 08/30/2014   Generalized bloating 03/12/2014   Plantar fasciitis of left foot    Avitaminosis D 09/17/2008   Benign neoplasm of body of stomach 11/21/2007   History of colon polyps 11/21/2007   Anemia, iron deficiency 06/09/2007   Acid reflux 06/09/2007   IBS (irritable bowel syndrome) 06/09/2007   Past Medical History:  Diagnosis Date   Adrenal nodule (HCC)    Allergic rhinitis    Asthma    Cervical polyp    Constipation    Generalized abdominal pain    GERD (gastroesophageal reflux disease)    History of mammogram 2015;01/02/15; 01/06/16   birads 1; birads 1; neg   History of Papanicolaou smear of cervix 2011;03/10/15   neg; neg   IBS (irritable bowel syndrome)    Mood disorder ([place])    Obesity    Plantar fasciitis of left foot 09/29/2012   Renal  lesion    Vaginal birth after cesarean 1993       Medications: Outpatient Medications Prior to Visit  Medication Sig   ibuprofen (ADVIL) 200 MG tablet Take 800 mg by mouth every 8 (eight) hours as needed (for pain.).   Multiple Vitamin (MULTIVITAMIN WITH MINERALS) TABS tablet Take 1 tablet by mouth every evening.   senna-docusate (SENOKOT-S) 8.6-50 MG per tablet Take 2 tablets by mouth 2 (two) times daily.    [DISCONTINUED] buPROPion (WELLBUTRIN XL) 150 MG 24 hr tablet TAKE 1 TABLET BY MOUTH  DAILY . PLEASE SCHEDULE IN  OFFICE VISIT BEFORE ANYMORE REFILLS   [DISCONTINUED] fexofenadine (ALLEGRA) 180 MG tablet Take 1 tablet (180 mg total) by mouth every evening.   [DISCONTINUED] fluticasone (FLONASE) 50 MCG/ACT nasal spray Place 2 sprays into both nostrils daily.   [DISCONTINUED] montelukast (SINGULAIR) 10 MG tablet Take 1 tablet (10 mg total) by mouth at bedtime. Please schedule office visit before any future refills.   [DISCONTINUED] pantoprazole (PROTONIX) 40 MG tablet TAKE 1 TABLET BY MOUTH  TWICE DAILY   [DISCONTINUED] PROAIR HFA 108 (90 Base) MCG/ACT inhaler Inhale 2 puffs into the lungs every 4 (four) hours as needed.   No facility-administered medications prior to visit.    Review of Systems  Constitutional: Negative.   Respiratory: Negative.   Cardiovascular: Negative.   Endocrine: Negative.   Musculoskeletal: Positive for arthralgias (left thumb).  Neurological: Negative.     Last CBC Lab Results  Component Value Date   WBC 4.8 11/02/2018   HGB 13.7 11/02/2018   HCT 41.5 11/02/2018   MCV  94 11/02/2018   MCH 30.9 11/02/2018   RDW 11.9 11/02/2018   PLT 194 40/97/3532   Last metabolic panel Lab Results  Component Value Date   GLUCOSE 101 (H) 11/02/2018   NA 141 11/02/2018   K 4.8 11/02/2018   CL 103 11/02/2018   CO2 26 11/02/2018   BUN 19 11/02/2018   CREATININE 0.68 11/02/2018   GFRNONAA 101 11/02/2018   GFRAA 116 11/02/2018   CALCIUM 9.9  11/02/2018   PROT 7.1 11/02/2018   ALBUMIN 4.8 11/02/2018   LABGLOB 2.3 11/02/2018   AGRATIO 2.1 11/02/2018   BILITOT 0.4 11/02/2018   ALKPHOS 93 11/02/2018   AST 21 11/02/2018   ALT 23 11/02/2018      Objective    BP 120/71 (BP Location: Right Arm, Patient Position: Sitting, Cuff Size: Large)    Pulse 67    Temp 98.4 F (36.9 C) (Oral)    Resp 16    Ht 5\' 6"  (1.676 m)    Wt 156 lb 3.2 oz (70.9 kg)    BMI 25.21 kg/m  BP Readings from Last 3 Encounters:  01/22/20 120/71  02/07/19 120/60  01/20/19 120/80   Wt Readings from Last 3 Encounters:  01/22/20 156 lb 3.2 oz (70.9 kg)  02/07/19 154 lb (69.9 kg)  01/20/19 153 lb (69.4 kg)      Physical Exam Vitals reviewed.  Constitutional:      General: She is not in acute distress.    Appearance: Normal appearance. She is well-developed, normal weight and well-nourished. She is not ill-appearing.  HENT:     Head: Normocephalic and atraumatic.  Eyes:     Extraocular Movements: EOM normal.  Cardiovascular:     Pulses: Normal pulses.  Pulmonary:     Effort: Pulmonary effort is normal. No respiratory distress.  Musculoskeletal:     Right hand: Normal.     Left hand: Bony tenderness (over 1st CMC joint) present. No swelling or deformity. Normal range of motion. Normal strength. Normal sensation. There is no disruption of two-point discrimination. Normal capillary refill. Normal pulse.     Cervical back: Normal range of motion and neck supple.  Skin:    General: Skin is warm and dry.     Capillary Refill: Capillary refill takes less than 2 seconds.  Neurological:     Mental Status: She is alert.  Psychiatric:        Mood and Affect: Mood and affect normal.        Behavior: Behavior normal.        Thought Content: Thought content normal.        Judgment: Judgment normal.      No results found for any visits on 01/22/20.  Assessment & Plan     1. Depression, major, single episode, complete remission (HCC) Stable. Diagnosis  pulled for medication refill. Continue current medical treatment plan. - buPROPion (WELLBUTRIN XL) 150 MG 24 hr tablet; TAKE 1 TABLET BY MOUTH  DAILY  Dispense: 90 tablet; Refill: 3  2. Non-seasonal allergic rhinitis due to pollen Stable. Diagnosis pulled for medication refill. Continue current medical treatment plan. - fexofenadine (ALLEGRA) 180 MG tablet; Take 1 tablet (180 mg total) by mouth every evening.  Dispense: 90 tablet; Refill: 3 - fluticasone (FLONASE) 50 MCG/ACT nasal spray; Place 2 sprays into both nostrils daily.  Dispense: 48 g; Refill: 3 - montelukast (SINGULAIR) 10 MG tablet; Take 1 tablet (10 mg total) by mouth at bedtime.  Dispense: 90 tablet; Refill: 3  3. Mild intermittent asthma without complication Stable. Diagnosis pulled for medication refill. Continue current medical treatment plan. - montelukast (SINGULAIR) 10 MG tablet; Take 1 tablet (10 mg total) by mouth at bedtime.  Dispense: 90 tablet; Refill: 3 - PROAIR HFA 108 (90 Base) MCG/ACT inhaler; Inhale 2 puffs into the lungs every 4 (four) hours as needed.  Dispense: 18 g; Refill: 5  4. Gastroesophageal reflux disease with esophagitis without hemorrhage Stable. Diagnosis pulled for medication refill. Continue current medical treatment plan. - pantoprazole (PROTONIX) 40 MG tablet; Take 1 tablet (40 mg total) by mouth 2 (two) times daily.  Dispense: 180 tablet; Refill: 3  5. Primary osteoarthritis of first carpometacarpal joint of left hand Discussed using tumeric for arthritic hand pain. Can ice after big crocheting projects. If continues to progress or worsen she can call and may consider referral to orhtopedics, hand.    Return if symptoms worsen or fail to improve.      Reynolds Bowl, PA-C, have reviewed all documentation for this visit. The documentation on 01/22/20 for the exam, diagnosis, procedures, and orders are all accurate and complete.   Rubye Beach  [facility] [phone] (phone) [phone] (fax)  [place]

## 2020-01-22 NOTE — Patient Instructions (Signed)
Hand Pain Many things can cause hand pain. Some common causes are:  An injury.  Repeating the same movement with your hand over and over (overuse).  Osteoporosis.  Arthritis.  Lumps in the tendons or joints of the hand and wrist (ganglion cysts).  Nerve compression syndromes (carpal tunnel syndrome).  Inflammation of the tendons (tendinitis).  Infection. Follow these instructions at home: Pay attention to any changes in your symptoms. Take these actions to help with your discomfort: Managing pain, stiffness, and swelling   Take over-the-counter and prescription medicines only as told by your health care provider.  Wear a hand splint or support as told by your health care provider.  If directed, put ice on the affected area: ? Put ice in a plastic bag. ? Place a towel between your skin and the bag. ? Leave the ice on for 20 minutes, 2-3 times a day. Activity  Take breaks from repetitive activity often.  Avoid activities that make your pain worse.  Minimize stress on your hands and wrists as much as possible.  Do stretches or exercises as told by your health care provider.  Do not do activities that make your pain worse. Contact a health care provider if:  Your pain does not get better after a few days of self-care.  Your pain gets worse.  Your pain affects your ability to do your daily activities. Get help right away if:  Your hand becomes warm, red, or swollen.  Your hand is numb or tingling.  Your hand is extremely swollen or deformed.  Your hand or fingers turn white or blue.  You cannot move your hand, wrist, or fingers. Summary  Many things can cause hand pain.  Contact your health care provider if your pain does not get better after a few days of self care.  Minimize stress on your hands and wrists as much as possible.  Do not do activities that make your pain worse. This information is not intended to replace advice given to you by your  health care provider. Make sure you discuss any questions you have with your health care provider. Document Revised: 10/22/2017 Document Reviewed: 10/22/2017 Elsevier Patient Education  2020 Elsevier Inc.  

## 2020-01-23 ENCOUNTER — Encounter: Payer: Self-pay | Admitting: Physician Assistant

## 2020-01-23 DIAGNOSIS — F325 Major depressive disorder, single episode, in full remission: Secondary | ICD-10-CM

## 2020-01-23 DIAGNOSIS — K2100 Gastro-esophageal reflux disease with esophagitis, without bleeding: Secondary | ICD-10-CM

## 2020-01-23 DIAGNOSIS — J4520 Mild intermittent asthma, uncomplicated: Secondary | ICD-10-CM

## 2020-01-23 DIAGNOSIS — J301 Allergic rhinitis due to pollen: Secondary | ICD-10-CM

## 2020-01-23 MED ORDER — MONTELUKAST SODIUM 10 MG PO TABS: 10.0000 mg | ORAL_TABLET | Freq: Every day | ORAL | 3 refills | Status: DC

## 2020-01-23 MED ORDER — PANTOPRAZOLE SODIUM 40 MG PO TBEC: 40.0000 mg | DELAYED_RELEASE_TABLET | Freq: Two times a day (BID) | ORAL | 3 refills | Status: DC

## 2020-01-23 MED ORDER — FEXOFENADINE HCL 180 MG PO TABS: 180.0000 mg | ORAL_TABLET | Freq: Every evening | ORAL | 3 refills | Status: DC

## 2020-01-23 MED ORDER — BUPROPION HCL ER (XL) 150 MG PO TB24: ORAL_TABLET | ORAL | 3 refills | Status: DC

## 2020-01-24 ENCOUNTER — Ambulatory Visit (INDEPENDENT_AMBULATORY_CARE_PROVIDER_SITE_OTHER): Payer: 59 | Admitting: Obstetrics

## 2020-01-24 ENCOUNTER — Encounter: Payer: Self-pay | Admitting: Obstetrics

## 2020-01-24 ENCOUNTER — Other Ambulatory Visit: Payer: Self-pay

## 2020-01-24 ENCOUNTER — Ambulatory Visit
Admission: RE | Admit: 2020-01-24 | Discharge: 2020-01-24 | Disposition: A | Discharge status: Home / Self Care | Payer: 59 | Source: Ambulatory Visit | Attending: Obstetrics | Admitting: Obstetrics

## 2020-01-24 VITALS — BP 126/78 | HR 72 | Ht 66.0 in | Wt 156.0 lb

## 2020-01-24 DIAGNOSIS — Z1231 Encounter for screening mammogram for malignant neoplasm of breast: Secondary | ICD-10-CM | POA: Diagnosis present

## 2020-01-24 DIAGNOSIS — N939 Abnormal uterine and vaginal bleeding, unspecified: Secondary | ICD-10-CM

## 2020-01-24 DIAGNOSIS — Z124 Encounter for screening for malignant neoplasm of cervix: Secondary | ICD-10-CM | POA: Diagnosis not present

## 2020-01-24 DIAGNOSIS — Z01419 Encounter for gynecological examination (general) (routine) without abnormal findings: Secondary | ICD-10-CM

## 2020-01-24 DIAGNOSIS — Z0000 Encounter for general adult medical examination without abnormal findings: Secondary | ICD-10-CM

## 2020-01-24 NOTE — Progress Notes (Signed)
Routine Annual Gynecology Examination   PCP: Mar Daring, PA-C  Chief Complaint:  Chief Complaint  Patient presents with  . Gynecologic Exam    Has had ablation - bled 2/14-21, 7/5-12, 11/9-21, 12/3-4 spotting, 12/5 - present.    History of Present Illness: Patient is a 54 y.o. [id number] presents for annual exam.  She relates a problem with irregular and ongoing vaginal bleeding over the last few months. Had an ablation in 2020. Menopausal bleeding: reports  Menopausal symptoms: reports She has some irregular sleep disturbance.  Breast symptoms: denies  Last pap smear: 1 years ago.  Result Normal  Last mammogram: 1 years ago.  Result Normal  Past Medical History:  Diagnosis Date  . Adrenal nodule ([place])   . Allergic rhinitis   . Asthma   . Cervical polyp   . Constipation   . Generalized abdominal pain   . GERD (gastroesophageal reflux disease)   . History of mammogram 2015;01/02/15; 01/06/16   birads 1; birads 1; neg  . History of Papanicolaou smear of cervix 2011;03/10/15   neg; neg  . IBS (irritable bowel syndrome)   . Mood disorder ([place])   . Obesity   . Plantar fasciitis of left foot 09/29/2012  . Renal lesion   . Vaginal birth after cesarean 1993    Past Surgical History:  Procedure Laterality Date  . ANTERIOR CERVICAL DECOMP/DISCECTOMY FUSION  2005   C4-6  . CESAREAN SECTION  1990  . colonoscopy with polypectomy  06/2007; 2016   abd pain and chronic coinstipation  . DIAGNOSTIC LAPAROSCOPY     normal pelvis  . ESOPHAGOGASTRODUODENOSCOPY  06/2007; 2016   gastric polyps  . HYSTEROSCOPY WITH D & C N/A 10/13/2018   Procedure: DILATATION AND CURETTAGE /HYSTEROSCOPY;  Surgeon: Gae Dry, MD;  Location: [facility] ORS;  Service: Gynecology;  Laterality: N/A;  . LEEP  12/06/2008   cx polyp removal-path was HPV changes - [facility]    Prior to Admission medications   Medication Sig Start Date End Date Taking? Authorizing Provider  buPROPion (WELLBUTRIN XL)  150 MG 24 hr tablet TAKE 1 TABLET BY MOUTH  DAILY 01/23/20  Yes Mar Daring, PA-C  fexofenadine (ALLEGRA) 180 MG tablet Take 1 tablet (180 mg total) by mouth every evening. 01/23/20  Yes Fenton Malling M, PA-C  fluticasone (FLONASE) 50 MCG/ACT nasal spray Place 2 sprays into both nostrils daily. 01/22/20  Yes Mar Daring, PA-C  ibuprofen (ADVIL) 200 MG tablet Take 800 mg by mouth every 8 (eight) hours as needed (for pain.).   Yes [provider]  montelukast (SINGULAIR) 10 MG tablet Take 1 tablet (10 mg total) by mouth at bedtime. 01/23/20  Yes Mar Daring, PA-C  Multiple Vitamin (MULTIVITAMIN WITH MINERALS) TABS tablet Take 1 tablet by mouth every evening.   Yes [provider]  pantoprazole (PROTONIX) 40 MG tablet Take 1 tablet (40 mg total) by mouth 2 (two) times daily. 01/23/20  Yes Mar Daring, PA-C  PROAIR HFA 108 [phone] Base) MCG/ACT inhaler Inhale 2 puffs into the lungs every 4 (four) hours as needed. 01/22/20  Yes Fenton Malling M, PA-C  senna-docusate (SENOKOT-S) 8.6-50 MG per tablet Take 2 tablets by mouth 2 (two) times daily.    Yes [provider]    Allergies  Allergen Reactions  . Erythromycin Swelling  . Bee Venom     Gynecologic History:  Patient's last menstrual period was 01/14/2020 (exact date). Contraception: post menopausal status Last Pap: 2020.  Results were: normal Last mammogram: 2020. Results were: normal  Obstetric History: G2P2002  Social History   Socioeconomic History  . Marital status: Married    Spouse name: Not on file  . Number of children: 2  . Years of education: H/S  . Highest education level: Not on file  Occupational History  . Occupation: Programme researcher, broadcasting/film/video: [employer]  Tobacco Use  . Smoking status: Never Smoker  . Smokeless tobacco: Never Used  Vaping Use  . Vaping Use: Never used  Substance and Sexual Activity  . Alcohol use: No    Alcohol/week: 0.0 standard drinks   . Drug use: No  . Sexual activity: Yes    Partners: Male    Birth control/protection: None, Surgical    Comment: ablation  Other Topics Concern  . Not on file  Social History Narrative   Janasha's daughter Apolonio Schneiders has 3 children -ages 62,2,and 45(2018). Her son is a Education officer, museum.   Social Determinants of Health   Financial Resource Strain: Not on file  Food Insecurity: Not on file  Transportation Needs: Not on file  Physical Activity: Not on file  Stress: Not on file  Social Connections: Not on file  Intimate Partner Violence: Not on file    Family History  Problem Relation Age of Onset  . Diabetes Father   . Hypertension Father   . Benign prostatic hyperplasia Father   . Bladder Cancer Father   . Hyperlipidemia Father   . Dementia Father        Lewy Body Dementia  . Asthma Father   . Melanoma Father   . COPD Mother   . Heart disease Mother   . Stroke Mother   . Hypertension Mother   . Diabetes Sister   . Hyperlipidemia Brother   . Hypertension Brother   . Cancer Maternal Grandmother 50       pancreatic  . Prostate cancer Neg Hx   . Kidney disease Neg Hx   . Breast cancer Neg Hx     Review of Systems  Genitourinary:       Reports ongoing irregular vaginal bleeding since October. See notes. Hx of ablation.  All other systems reviewed and are negative.    Physical Exam Vitals: BP 126/78   Pulse 72   Ht 5\' 6"  (1.676 m)   Wt 156 lb (70.8 kg)   LMP 01/14/2020 (Exact Date)   BMI 25.18 kg/m   Physical Exam Constitutional:      Appearance: Normal appearance. She is normal weight.  Genitourinary:     Vulva and rectum normal.     Genitourinary Comments: Normal external genitalia, normal hair distribution. No atrophy noted. No vaginal discharge. No rashes, lesions or areas of irritation. Vaginal mucosa pink in color Uterus in non enlarged, midline, without irregularity No adnexal masses.  HENT:     Head: Normocephalic and atraumatic.     Left  Ear: External ear normal.     Nose: Nose normal.  Eyes:     Extraocular Movements: Extraocular movements intact.     Pupils: Pupils are equal, round, and reactive to light.  Cardiovascular:     Rate and Rhythm: Normal rate and regular rhythm.     Pulses: Normal pulses.     Heart sounds: Normal heart sounds.  Pulmonary:     Effort: Pulmonary effort is normal.     Breath sounds: Normal breath sounds.  Abdominal:     General: Bowel sounds are normal.  Palpations: Abdomen is soft.  Musculoskeletal:        General: Normal range of motion.     Cervical back: Normal range of motion and neck supple.  Neurological:     General: No focal deficit present.     Mental Status: She is alert and oriented to person, place, and time.  Skin:    General: Skin is warm and dry.  Psychiatric:        Mood and Affect: Mood normal.        Behavior: Behavior normal.      Female chaperone present for pelvic and breast  portions of the physical exam      Assessment and Plan:  54 y.o. G83P2002 female here for routine annual gynecologic examination  AUB this year, with increase in daily spotting over the last two months.  Plan: Problem List Items Addressed This Visit   None   Visit Diagnoses    Women's annual routine gynecological examination    -  Primary   Relevant Orders   CBC with Differential/Platelet   Lipid Panel With LDL/HDL Ratio   IGP,CtNg,AptimaHPV,rfx16/18,45   Vitamin D (25 hydroxy)   Comprehensive metabolic panel   Abnormal uterine bleeding (AUB)       Relevant Orders   US PELVIS (TRANSABDOMINAL ONLY)   IGP,CtNg,AptimaHPV,rfx16/18,45   Screening for cervical cancer       Relevant Orders   IGP,CtNg,AptimaHPV,rfx16/18,45   Health care maintenance       Relevant Orders   CBC with Differential/Platelet   Lipid Panel With LDL/HDL Ratio   Vitamin D (25 hydroxy)   Comprehensive metabolic panel      Screening: -- Blood pressure screen normal -- Colonoscopy - not due --  Mammogram - ordered by this provider. She has an appointment scheduled for today. -- Weight screening: normal -- Depression screening negative (PHQ-9) -- Nutrition: normal -- cholesterol screening: will obtain -- osteoporosis screening: not due -- tobacco screening: not using -- alcohol screening: AUDIT questionnaire indicates low-risk usage. -- family history of breast cancer screening: done. not at high risk. -- no evidence of domestic violence or intimate partner violence. -- STD screening: gonorrhea/chlamydia NAAT not collected per patient request. -- pap smear collected per ASCCP guidelines -- flu vaccine per her PCP -- HPV vaccination series: not eligilbe  Discussed referral to Dr. Glennon Mac for evaluation of her AUB.  Pelvic ultrasound scheduled and appt with Glennon Mac set up.  Imagene Riches, CNM  01/24/2020 5:06 PM      01/24/2020 4:48 PM

## 2020-01-25 ENCOUNTER — Encounter: Payer: Self-pay | Admitting: Obstetrics

## 2020-01-25 LAB — CBC WITH DIFFERENTIAL/PLATELET
Basophils Absolute: 0.1 10*3/uL (ref 0.0–0.2)
Basos: 1 %
EOS (ABSOLUTE): 0.2 10*3/uL (ref 0.0–0.4)
Eos: 4 %
Hematocrit: 41.1 % (ref 34.0–46.6)
Hemoglobin: 13.7 g/dL (ref 11.1–15.9)
Immature Grans (Abs): 0 10*3/uL (ref 0.0–0.1)
Immature Granulocytes: 0 %
Lymphocytes Absolute: 1.3 10*3/uL (ref 0.7–3.1)
Lymphs: 23 %
MCH: 31.2 pg (ref 26.6–33.0)
MCHC: 33.3 g/dL (ref 31.5–35.7)
MCV: 94 fL (ref 79–97)
Monocytes Absolute: 0.5 10*3/uL (ref 0.1–0.9)
Monocytes: 8 %
Neutrophils Absolute: 3.6 10*3/uL (ref 1.4–7.0)
Neutrophils: 64 %
Platelets: 202 10*3/uL (ref 150–450)
RBC: 4.39 x10E6/uL (ref 3.77–5.28)
RDW: 12.6 % (ref 11.7–15.4)
WBC: 5.6 10*3/uL (ref 3.4–10.8)

## 2020-01-25 LAB — COMPREHENSIVE METABOLIC PANEL
ALT: 22 IU/L (ref 0–32)
AST: 17 IU/L (ref 0–40)
Albumin/Globulin Ratio: 2.4 — ABNORMAL HIGH (ref 1.2–2.2)
Albumin: 4.8 g/dL (ref 3.8–4.9)
Alkaline Phosphatase: 97 IU/L (ref 44–121)
BUN/Creatinine Ratio: 29 — ABNORMAL HIGH (ref 9–23)
BUN: 20 mg/dL (ref 6–24)
Bilirubin Total: 0.4 mg/dL (ref 0.0–1.2)
CO2: 22 mmol/L (ref 20–29)
Calcium: 9.4 mg/dL (ref 8.7–10.2)
Chloride: 105 mmol/L (ref 96–106)
Creatinine, Ser: 0.68 mg/dL (ref 0.57–1.00)
GFR calc Af Amer: 115 mL/min/{1.73_m2} (ref >59)
GFR calc non Af Amer: 99 mL/min/{1.73_m2} (ref >59)
Globulin, Total: 2 g/dL (ref 1.5–4.5)
Glucose: 81 mg/dL (ref 65–99)
Potassium: 5.1 mmol/L (ref 3.5–5.2)
Sodium: 142 mmol/L (ref 134–144)
Total Protein: 6.8 g/dL (ref 6.0–8.5)

## 2020-01-25 LAB — LIPID PANEL WITH LDL/HDL RATIO
Cholesterol, Total: 259 mg/dL — ABNORMAL HIGH (ref 100–199)
HDL: 96 mg/dL (ref >39)
LDL Chol Calc (NIH): 146 mg/dL — ABNORMAL HIGH (ref 0–99)
LDL/HDL Ratio: 1.5 ratio (ref 0.0–3.2)
Triglycerides: 100 mg/dL (ref 0–149)
VLDL Cholesterol Cal: 17 mg/dL (ref 5–40)

## 2020-01-25 LAB — VITAMIN D 25 HYDROXY (VIT D DEFICIENCY, FRACTURES): Vit D, 25-Hydroxy: 27.5 ng/mL — ABNORMAL LOW (ref 30.0–100.0)

## 2020-01-28 LAB — IGP,CTNG,APTIMAHPV,RFX16/18,45
Chlamydia, Nuc. Acid Amp: NEGATIVE
Gonococcus by Nucleic Acid Amp: NEGATIVE
HPV Aptima: NEGATIVE

## 2020-02-13 ENCOUNTER — Ambulatory Visit: Payer: 59 | Admitting: Obstetrics and Gynecology

## 2020-02-13 ENCOUNTER — Ambulatory Visit: Payer: 59

## 2020-02-27 ENCOUNTER — Ambulatory Visit: Payer: 59 | Admitting: Obstetrics and Gynecology

## 2020-02-27 ENCOUNTER — Other Ambulatory Visit: Payer: 59

## 2020-02-28 ENCOUNTER — Other Ambulatory Visit: Payer: Self-pay | Admitting: Obstetrics and Gynecology

## 2020-02-28 DIAGNOSIS — N939 Abnormal uterine and vaginal bleeding, unspecified: Secondary | ICD-10-CM

## 2020-03-05 ENCOUNTER — Ambulatory Visit (INDEPENDENT_AMBULATORY_CARE_PROVIDER_SITE_OTHER): Payer: 59 | Admitting: Obstetrics and Gynecology

## 2020-03-05 ENCOUNTER — Other Ambulatory Visit: Payer: Self-pay

## 2020-03-05 ENCOUNTER — Ambulatory Visit (INDEPENDENT_AMBULATORY_CARE_PROVIDER_SITE_OTHER): Payer: 59

## 2020-03-05 VITALS — BP 118/78 | Ht 66.0 in | Wt 158.0 lb

## 2020-03-05 DIAGNOSIS — N921 Excessive and frequent menstruation with irregular cycle: Secondary | ICD-10-CM | POA: Diagnosis not present

## 2020-03-05 DIAGNOSIS — N939 Abnormal uterine and vaginal bleeding, unspecified: Secondary | ICD-10-CM | POA: Diagnosis not present

## 2020-03-05 NOTE — Progress Notes (Signed)
Gynecology Ultrasound Follow Up   Chief Complaint  Patient presents with  . Follow-up    Pt stopped bleeding 12/19  U/S for abnormal bleeding after ablation   History of Present Illness: Patient is a 55 y.o. female who presents today for ultrasound evaluation of the above .  Ultrasound demonstrates the following findings Adnexa: no masses seen  Uterus: retroverted with endometrial stripe  3.8 mm Additional: complex area seen within or near endometrial lining measuring 9 x 8 mm.   She has been having irregular bleeding since the procedure. Her last bout of bleeding was in December 2021.   Past Medical History:  Diagnosis Date  . Adrenal nodule ([place])   . Allergic rhinitis   . Asthma   . Cervical polyp   . Constipation   . Generalized abdominal pain   . GERD (gastroesophageal reflux disease)   . History of mammogram 2015;01/02/15; 01/06/16   birads 1; birads 1; neg  . History of Papanicolaou smear of cervix 2011;03/10/15   neg; neg  . IBS (irritable bowel syndrome)   . Mood disorder ([place])   . Obesity   . Plantar fasciitis of left foot 09/29/2012  . Renal lesion   . Vaginal birth after cesarean 1993    Past Surgical History:  Procedure Laterality Date  . ANTERIOR CERVICAL DECOMP/DISCECTOMY FUSION  2005   C4-6  . CESAREAN SECTION  1990  . colonoscopy with polypectomy  06/2007; 2016   abd pain and chronic coinstipation  . DIAGNOSTIC LAPAROSCOPY     normal pelvis  . ESOPHAGOGASTRODUODENOSCOPY  06/2007; 2016   gastric polyps  . HYSTEROSCOPY WITH D & C N/A 10/13/2018   Procedure: DILATATION AND CURETTAGE /HYSTEROSCOPY;  Surgeon: Gae Dry, MD;  Location: [facility] ORS;  Service: Gynecology;  Laterality: N/A;  . LEEP  12/06/2008   cx polyp removal-path was HPV changes - CAK    Family History  Problem Relation Age of Onset  . Diabetes Father   . Hypertension Father   . Benign prostatic hyperplasia Father   . Bladder Cancer Father   . Hyperlipidemia Father   .  Dementia Father        Lewy Body Dementia  . Asthma Father   . Melanoma Father   . COPD Mother   . Heart disease Mother   . Stroke Mother   . Hypertension Mother   . Diabetes Sister   . Hyperlipidemia Brother   . Hypertension Brother   . Cancer Maternal Grandmother 53       pancreatic  . Prostate cancer Neg Hx   . Kidney disease Neg Hx   . Breast cancer Neg Hx     Social History   Socioeconomic History  . Marital status: Married    Spouse name: Not on file  . Number of children: 2  . Years of education: H/S  . Highest education level: Not on file  Occupational History  . Occupation: Programme researcher, broadcasting/film/video: [employer]  Tobacco Use  . Smoking status: Never Smoker  . Smokeless tobacco: Never Used  Vaping Use  . Vaping Use: Never used  Substance and Sexual Activity  . Alcohol use: No    Alcohol/week: 0.0 standard drinks  . Drug use: No  . Sexual activity: Yes    Partners: Male    Birth control/protection: None, Surgical    Comment: ablation  Other Topics Concern  . Not on file  Social History Narrative   Lakea's daughter Apolonio Schneiders has 3 children -  ages 22,2,and 65(2018). Her son is a Education officer, museum.   Social Determinants of Health   Financial Resource Strain: Not on file  Food Insecurity: Not on file  Transportation Needs: Not on file  Physical Activity: Not on file  Stress: Not on file  Social Connections: Not on file  Intimate Partner Violence: Not on file    Allergies  Allergen Reactions  . Erythromycin Swelling  . Bee Venom     Prior to Admission medications   Medication Sig Start Date End Date Taking? Authorizing Provider  buPROPion (WELLBUTRIN XL) 150 MG 24 hr tablet TAKE 1 TABLET BY MOUTH  DAILY 01/23/20   Mar Daring, PA-C  fexofenadine (ALLEGRA) 180 MG tablet Take 1 tablet (180 mg total) by mouth every evening. 01/23/20   Mar Daring, PA-C  fluticasone (FLONASE) 50 MCG/ACT nasal spray Place 2 sprays into both nostrils daily.  01/22/20   Mar Daring, PA-C  ibuprofen (ADVIL) 200 MG tablet Take 800 mg by mouth every 8 (eight) hours as needed (for pain.).    [provider]  montelukast (SINGULAIR) 10 MG tablet Take 1 tablet (10 mg total) by mouth at bedtime. 01/23/20   Mar Daring, PA-C  Multiple Vitamin (MULTIVITAMIN WITH MINERALS) TABS tablet Take 1 tablet by mouth every evening.    [provider]  pantoprazole (PROTONIX) 40 MG tablet Take 1 tablet (40 mg total) by mouth 2 (two) times daily. 01/23/20   Mar Daring, PA-C  PROAIR HFA 108 [phone] Base) MCG/ACT inhaler Inhale 2 puffs into the lungs every 4 (four) hours as needed. 01/22/20   Mar Daring, PA-C  senna-docusate (SENOKOT-S) 8.6-50 MG per tablet Take 2 tablets by mouth 2 (two) times daily.     [provider]    Physical Exam BP 118/78   Ht 5\' 6"  (1.676 m)   Wt 158 lb (71.7 kg)   BMI 25.50 kg/m    General: NAD HEENT: normocephalic, anicteric Pulmonary: No increased work of breathing Extremities: no edema, erythema, or tenderness Neurologic: Grossly intact, normal gait Psychiatric: mood appropriate, affect full  Imaging Results No results found.  Assessment: 55 y.o. G2P2002  1. Menorrhagia with irregular cycle      Plan: Problem List Items Addressed This Visit   None   Visit Diagnoses    Menorrhagia with irregular cycle    -  Primary     We discussed her bleeding in her current context of having had an ablation.  We also discussed the ultrasound findings in detail.  We discussed that there is no easy way to evaluate any abnormal ultrasound findings and no way to monitor for developing neoplasia.   Treatment options were discussed, including continuing to monitor since she is so close to menopause most likely.  We discussed the medication treatment would likely be of limited value.  Surgical treatment would be limited to hysterectomy at this time.  She would like to monitor her symptoms  for now and consider action, if warranted.   A total of 25 minutes were spent face-to-face with the patient as well as preparation, review, communication, and documentation during this encounter.    Prentice Docker, MD, [facility], [facility] 03/05/2020 11:04 AM

## 2020-03-06 ENCOUNTER — Encounter: Payer: Self-pay | Admitting: Obstetrics and Gynecology

## 2020-04-03 ENCOUNTER — Telehealth: Payer: Self-pay

## 2020-04-03 NOTE — Telephone Encounter (Signed)
Called patient to schedule TLH/BS, Cystoscopy w Kendra Opitz 05/21/20  H&P 4/4 @ 8:10   Covid testing 4/11 @ 8-10:30, [address], drive up and wear mask. Advised pt to quarantine until DOS.  Pre-admit phone call appointment to be requested - date and time will be included on H&P paper work. Also all appointments will be updated on pt MyChart. Explained that this appointment has a call window. Based on the time scheduled will indicate if the call will be received within a 4 hour window before 1:00 or after.  Advised that pt may also receive calls from the hospital pharmacy and pre-service center.  Confirmed pt has [employer] as Chartered certified accountant. No secondary insurance.  Prior Josem Kaufmann is pending. Will notify pt once rec'd

## 2020-04-03 NOTE — Telephone Encounter (Signed)
-----   Message from Will Bonnet, MD sent at 03/13/2020  1:06 PM EST ----- Regarding: Schedule surgery Surgery Booking Request Patient Full Name:  Janice Donovan  MRN: [id number]  DOB: 07-28-1965  Surgeon: Prentice Docker, MD  Requested Surgery Date and Time: TBD Primary Diagnosis AND Code:  Menorrhagia with irregular cycle [N92.1] Secondary Diagnosis and Code:  Surgical Procedure: Total Laparoscopic Hysterectomy, bilateral salpingectomy, cystoscopy RNFA Requested?: No L&D Notification: No Admission Status: same day surgery Length of Surgery: 125 min Special Case Needs: No H&P: Yes Phone Interview???:  No Interpreter: No Medical Clearance:  No Special Scheduling Instructions: No Any known health/anesthesia issues, diabetes, sleep apnea, latex allergy, defibrillator/pacemaker?: No Acuity: P3   (P1 highest, P2 delay may cause harm, P3 low, elective gyn, P4 lowest)

## 2020-04-04 NOTE — Telephone Encounter (Signed)
PAT appt is 05/15/20

## 2020-04-04 NOTE — Telephone Encounter (Signed)
What is the date of her pre-op so that I can place orders on time?

## 2020-04-05 NOTE — Telephone Encounter (Signed)
Pre-op orders placed

## 2020-05-01 ENCOUNTER — Encounter: Payer: Self-pay | Admitting: Physician Assistant

## 2020-05-01 MED ORDER — AMOXICILLIN-POT CLAVULANATE 875-125 MG PO TABS: 1.0000 | ORAL_TABLET | Freq: Two times a day (BID) | ORAL | 0 refills | Status: DC

## 2020-05-10 ENCOUNTER — Other Ambulatory Visit: Payer: Self-pay

## 2020-05-10 ENCOUNTER — Ambulatory Visit: Payer: 59 | Admitting: Physician Assistant

## 2020-05-10 ENCOUNTER — Encounter: Payer: Self-pay | Admitting: Physician Assistant

## 2020-05-10 VITALS — BP 123/84 | HR 75 | Temp 98.2°F | Ht 66.0 in | Wt 155.9 lb

## 2020-05-10 DIAGNOSIS — J4520 Mild intermittent asthma, uncomplicated: Secondary | ICD-10-CM | POA: Diagnosis not present

## 2020-05-10 DIAGNOSIS — J301 Allergic rhinitis due to pollen: Secondary | ICD-10-CM

## 2020-05-10 DIAGNOSIS — N281 Cyst of kidney, acquired: Secondary | ICD-10-CM | POA: Diagnosis not present

## 2020-05-10 DIAGNOSIS — B379 Candidiasis, unspecified: Secondary | ICD-10-CM

## 2020-05-10 DIAGNOSIS — F325 Major depressive disorder, single episode, in full remission: Secondary | ICD-10-CM

## 2020-05-10 DIAGNOSIS — T3695XA Adverse effect of unspecified systemic antibiotic, initial encounter: Secondary | ICD-10-CM

## 2020-05-10 DIAGNOSIS — Z6825 Body mass index (BMI) 25.0-25.9, adult: Secondary | ICD-10-CM

## 2020-05-10 MED ORDER — BUPROPION HCL ER (XL) 150 MG PO TB24
ORAL_TABLET | ORAL | 3 refills | Status: DC
Start: 2020-05-10 — End: 2021-01-09

## 2020-05-10 MED ORDER — FLUCONAZOLE 150 MG PO TABS: 150.0000 mg | ORAL_TABLET | Freq: Once | ORAL | 0 refills | Status: AC

## 2020-05-10 MED ORDER — PREDNISONE 10 MG PO TABS: ORAL_TABLET | ORAL | 0 refills | Status: DC

## 2020-05-10 MED ORDER — AMOXICILLIN-POT CLAVULANATE 875-125 MG PO TABS
1.0000 | ORAL_TABLET | Freq: Two times a day (BID) | ORAL | 0 refills | Status: DC
Start: 2020-05-10 — End: 2020-05-19

## 2020-05-10 NOTE — Progress Notes (Signed)
Established patient visit   Patient: Janice Donovan   DOB: 1966/01/29   55 y.o. Female  MRN: [id number] Visit Date: 05/10/2020  Today's healthcare provider: Mar Daring, PA-C   Chief Complaint  Patient presents with  . Nasal Congestion  . Follow-up  I,Porsha C McClurkin,acting as a scribe for [employer], PA-C.,have documented all relevant documentation on the behalf of Mar Daring, PA-C,as directed by  Mar Daring, PA-C while in the presence of Mar Daring, PA-C.  Subjective    HPI  Follow up for sinus  The patient was last seen for this 3 months ago. Changes made at last visit include continue current medication.  She reports good compliance with treatment. She feels that condition is Improved. She is not having side effects.   -----------------------------------------------------------------------------------------  Patient Active Problem List   Diagnosis Date Noted  . Nail dystrophy 09/21/2019  . Endometrial polyp 09/22/2018  . Abnormal uterine bleeding 09/17/2018  . Porokeratosis 08/04/2018  . AB (asthmatic bronchitis) 12/11/2014  . Airway hyperreactivity 12/11/2014  . External hemorrhoid 12/11/2014  . Sinusitis, acute 12/11/2014  . Contraception management 10/16/2014  . Renal cyst 10/15/2014  . Allergic rhinitis 08/30/2014  . Generalized bloating 03/12/2014  . Plantar fasciitis of left foot   . Avitaminosis D 09/17/2008  . Benign neoplasm of body of stomach 11/21/2007  . History of colon polyps 11/21/2007  . Anemia, iron deficiency 06/09/2007  . Acid reflux 06/09/2007  . IBS (irritable bowel syndrome) 06/09/2007   Past Medical History:  Diagnosis Date  . Adrenal nodule ([place])   . Allergic rhinitis   . Asthma   . Cervical polyp   . Constipation   . Generalized abdominal pain   . GERD (gastroesophageal reflux disease)   . History of mammogram 2015;01/02/15; 01/06/16   birads 1; birads 1; neg  . History of  Papanicolaou smear of cervix 2011;03/10/15   neg; neg  . IBS (irritable bowel syndrome)   . Mood disorder ([place])   . Obesity   . Plantar fasciitis of left foot 09/29/2012  . Pneumonia    2010  . PONV (postoperative nausea and vomiting)    had nausea one time, hard to wake up many years ago  . Renal lesion   . Vaginal birth after cesarean 1993       Medications: Outpatient Medications Prior to Visit  Medication Sig  . amoxicillin-clavulanate (AUGMENTIN) 875-125 MG tablet Take 1 tablet by mouth 2 (two) times daily.  Marland Kitchen buPROPion (WELLBUTRIN XL) 150 MG 24 hr tablet TAKE 1 TABLET BY MOUTH  DAILY (Patient taking differently: Take 150 mg by mouth in the morning. TAKE 1 TABLET BY MOUTH  DAILY)  . Cholecalciferol (VITAMIN D3) 250 MCG (10000 UT) TABS Take 10,000 Units by mouth at bedtime.  . fexofenadine (ALLEGRA) 180 MG tablet Take 1 tablet (180 mg total) by mouth every evening.  . fluticasone (FLONASE) 50 MCG/ACT nasal spray Place 2 sprays into both nostrils daily. (Patient taking differently: Place 2 sprays into both nostrils daily as needed for allergies.)  . ibuprofen (ADVIL) 200 MG tablet Take 800 mg by mouth every 8 (eight) hours as needed (for pain.).  Marland Kitchen montelukast (SINGULAIR) 10 MG tablet Take 1 tablet (10 mg total) by mouth at bedtime.  . Multiple Vitamin (MULTIVITAMIN WITH MINERALS) TABS tablet Take 1 tablet by mouth at bedtime.  . pantoprazole (PROTONIX) 40 MG tablet Take 1 tablet (40 mg total) by mouth 2 (two) times  daily.  Marland Kitchen PROAIR HFA 108 (90 Base) MCG/ACT inhaler Inhale 2 puffs into the lungs every 4 (four) hours as needed. (Patient taking differently: Inhale 2 puffs into the lungs every 4 (four) hours as needed (asthma).)  . pseudoephedrine-guaifenesin (MUCINEX D) 60-600 MG 12 hr tablet Take 1 tablet by mouth in the morning and at bedtime.  . senna-docusate (SENOKOT-S) 8.6-50 MG per tablet Take 2 tablets by mouth 2 (two) times daily.    No facility-administered medications prior  to visit.    Review of Systems  Constitutional: Negative.   HENT: Negative.   Eyes: Negative.   Respiratory: Negative.   Neurological: Negative.   Psychiatric/Behavioral: Negative.     Last CBC Lab Results  Component Value Date   WBC 5.6 01/24/2020   HGB 13.7 01/24/2020   HCT 41.1 01/24/2020   MCV 94 01/24/2020   MCH 31.2 01/24/2020   RDW 12.6 01/24/2020   PLT 202 47/65/4650   Last metabolic panel Lab Results  Component Value Date   GLUCOSE 81 01/24/2020   NA 142 01/24/2020   K 5.1 01/24/2020   CL 105 01/24/2020   CO2 22 01/24/2020   BUN 20 01/24/2020   CREATININE 0.68 01/24/2020   GFRNONAA 99 01/24/2020   GFRAA 115 01/24/2020   CALCIUM 9.4 01/24/2020   PROT 6.8 01/24/2020   ALBUMIN 4.8 01/24/2020   LABGLOB 2.0 01/24/2020   AGRATIO 2.4 (H) 01/24/2020   BILITOT 0.4 01/24/2020   ALKPHOS 97 01/24/2020   AST 17 01/24/2020   ALT 22 01/24/2020       Objective    BP 123/84 (BP Location: Left Arm, Patient Position: Sitting, Cuff Size: Normal)   Pulse 75   Temp 98.2 F (36.8 C) (Oral)   Ht 5\' 6"  (1.676 m)   Wt 155 lb 14.4 oz (70.7 kg)   SpO2 100%   BMI 25.16 kg/m  BP Readings from Last 3 Encounters:  05/15/20 124/74  05/10/20 123/84  03/05/20 118/78   Wt Readings from Last 3 Encounters:  05/15/20 156 lb (70.8 kg)  05/10/20 155 lb 14.4 oz (70.7 kg)  03/05/20 158 lb (71.7 kg)       Physical Exam Vitals reviewed.  Constitutional:      General: She is not in acute distress.    Appearance: Normal appearance. She is well-developed. She is not ill-appearing or diaphoretic.  HENT:     Head: Normocephalic and atraumatic.  Cardiovascular:     Rate and Rhythm: Normal rate and regular rhythm.     Heart sounds: Normal heart sounds. No murmur heard. No friction rub. No gallop.   Pulmonary:     Effort: Pulmonary effort is normal. No respiratory distress.     Breath sounds: Normal breath sounds. No wheezing or rales.  Musculoskeletal:     Cervical back:  Normal range of motion and neck supple.  Neurological:     Mental Status: She is alert.  Psychiatric:        Mood and Affect: Mood normal.        Thought Content: Thought content normal.       No results found for any visits on 05/10/20.  Assessment & Plan     1. Depression, major, single episode, complete remission (HCC) Stable. Diagnosis pulled for medication refill. Continue current medical treatment plan. - buPROPion (WELLBUTRIN XL) 150 MG 24 hr tablet; TAKE 1 TABLET BY MOUTH  DAILY  Dispense: 90 tablet; Refill: 3  2. Non-seasonal allergic rhinitis due to pollen Worsening  symptoms that have not responded to OTC medications. Will give augmentin and prednisone as below. Continue allergy medications. Stay well hydrated and get plenty of rest. Call if no symptom improvement or if symptoms worsen. - amoxicillin-clavulanate (AUGMENTIN) 875-125 MG tablet; Take 1 tablet by mouth 2 (two) times daily.  Dispense: 20 tablet; Refill: 0 - predniSONE (DELTASONE) 10 MG tablet; Take 6 tablets PO on day 1 and day 2, take 5 tablets PO on day 3 and day 4, take 4 tablets PO on day 5 and day 6, take 3 tablets PO on day 7 and day 8, take 2 tablets PO on day 9 and day 10, take one tablet PO on day 11 and day 12.  Dispense: 42 tablet; Refill: 0  3. Mild intermittent asthma without complication Stable.   4. Renal cyst H/O this. Has been stable. Will refer to Urology for re-evaluation since provider is leaving.  - Ambulatory referral to Urology  5. Antibiotic-induced yeast infection Gets yeast infections with antibiotics. Diflucan given as below.  - fluconazole (DIFLUCAN) 150 MG tablet; Take 1 tablet (150 mg total) by mouth once for 1 dose.  Dispense: 1 tablet; Refill: 0  6. BMI 25.0-25.9,adult Counseled patient on healthy lifestyle modifications including dieting and exercise.    No follow-ups on file.      Reynolds Bowl, PA-C, have reviewed all documentation for this visit. The  documentation on 05/19/20 for the exam, diagnosis, procedures, and orders are all accurate and complete.   Rubye Beach  [facility] [phone] (phone) [phone] (fax)  [place]

## 2020-05-13 ENCOUNTER — Encounter: Payer: 59 | Admitting: Obstetrics and Gynecology

## 2020-05-15 ENCOUNTER — Ambulatory Visit (INDEPENDENT_AMBULATORY_CARE_PROVIDER_SITE_OTHER): Payer: 59 | Admitting: Obstetrics and Gynecology

## 2020-05-15 ENCOUNTER — Other Ambulatory Visit
Admission: RE | Admit: 2020-05-15 | Discharge: 2020-05-15 | Disposition: A | Discharge status: Home / Self Care | Payer: 59 | Source: Ambulatory Visit | Attending: Obstetrics and Gynecology | Admitting: Obstetrics and Gynecology

## 2020-05-15 ENCOUNTER — Other Ambulatory Visit: Payer: Self-pay

## 2020-05-15 ENCOUNTER — Encounter: Payer: Self-pay | Admitting: Obstetrics and Gynecology

## 2020-05-15 VITALS — BP 124/74 | Ht 66.0 in | Wt 156.0 lb

## 2020-05-15 DIAGNOSIS — N921 Excessive and frequent menstruation with irregular cycle: Secondary | ICD-10-CM

## 2020-05-15 HISTORY — DX: Other specified postprocedural states: R11.2

## 2020-05-15 HISTORY — DX: Other specified postprocedural states: Z98.890

## 2020-05-15 HISTORY — DX: Pneumonia, unspecified organism: J18.9

## 2020-05-15 NOTE — Progress Notes (Signed)
Preoperative History and Physical  Janice Donovan is a 55 y.o. [id number] here for surgical management of menorrhagia with irregular cycle.   No significant preoperative concerns.  History of Present Illness: 55 y.o. G73P2002 female who presents for pre-operative visit for irregular uterine bleeding. She had an endometrial ablation in 2020.    She had an ultrasound that showed:  Ultrasound demonstrates the following findings Adnexa: no masses seen  Uterus: retroverted with endometrial stripe  3.8 mm Additional: complex area seen within or near endometrial lining measuring 9 x 8 mm.   Pap smear 12/15/20221 - NILM, HPV negative Endometrial biopsy: not done due to endometrial ablation  Proposed surgery: Total Laparoscopic Hysterectomy, bilateral salpingectomy, cystoscopy  Past Medical History:  Diagnosis Date  . Adrenal nodule ([place])   . Allergic rhinitis   . Asthma   . Cervical polyp   . Constipation   . Generalized abdominal pain   . GERD (gastroesophageal reflux disease)   . History of mammogram 2015;01/02/15; 01/06/16   birads 1; birads 1; neg  . History of Papanicolaou smear of cervix 2011;03/10/15   neg; neg  . IBS (irritable bowel syndrome)   . Mood disorder ([place])   . Obesity   . Plantar fasciitis of left foot 09/29/2012  . Renal lesion   . Vaginal birth after cesarean 1993   Past Surgical History:  Procedure Laterality Date  . ANTERIOR CERVICAL DECOMP/DISCECTOMY FUSION  2005   C4-6  . CESAREAN SECTION  1990  . colonoscopy with polypectomy  06/2007; 2016   abd pain and chronic coinstipation  . DIAGNOSTIC LAPAROSCOPY     normal pelvis  . ESOPHAGOGASTRODUODENOSCOPY  06/2007; 2016   gastric polyps  . HYSTEROSCOPY WITH D & C N/A 10/13/2018   Procedure: DILATATION AND CURETTAGE /HYSTEROSCOPY;  Surgeon: Gae Dry, MD;  Location: [facility] ORS;  Service: Gynecology;  Laterality: N/A;  . LEEP  12/06/2008   cx polyp removal-path was HPV changes - CAK   OB History  Gravida  Para Term Preterm AB Living  2 2 2     2   SAB IAB Ectopic Multiple Live Births          2    # Outcome Date GA Lbr Len/2nd Weight Sex Delivery Anes PTL Lv  2 Term 03/29/91   7 lb 3 oz (3.26 kg) M VBAC   LIV  1 Term 02/07/89   6 lb 12 oz (3.062 kg) F CS-LTranv   LIV  Patient denies any other pertinent gynecologic issues.   Current Outpatient Medications on File Prior to Visit  Medication Sig Dispense Refill  . buPROPion (WELLBUTRIN XL) 150 MG 24 hr tablet TAKE 1 TABLET BY MOUTH  DAILY 90 tablet 3  . Cholecalciferol (VITAMIN D3) 250 MCG (10000 UT) TABS Take 10,000 Units by mouth at bedtime.    . fexofenadine (ALLEGRA) 180 MG tablet Take 1 tablet (180 mg total) by mouth every evening. 90 tablet 3  . ibuprofen (ADVIL) 200 MG tablet Take 800 mg by mouth every 8 (eight) hours as needed (for pain.).    Marland Kitchen montelukast (SINGULAIR) 10 MG tablet Take 1 tablet (10 mg total) by mouth at bedtime. 90 tablet 3  . Multiple Vitamin (MULTIVITAMIN WITH MINERALS) TABS tablet Take 1 tablet by mouth at bedtime.    . pantoprazole (PROTONIX) 40 MG tablet Take 1 tablet (40 mg total) by mouth 2 (two) times daily. 180 tablet 3  . senna-docusate (SENOKOT-S) 8.6-50 MG per tablet Take 2 tablets by  mouth 2 (two) times daily.      No current facility-administered medications on file prior to visit.   Allergies  Allergen Reactions  . Erythromycin Swelling  . Bee Venom Hives and Swelling    Social History:   reports that she has never smoked. She has never used smokeless tobacco. She reports that she does not drink alcohol and does not use drugs.  Family History  Problem Relation Age of Onset  . Diabetes Father   . Hypertension Father   . Benign prostatic hyperplasia Father   . Bladder Cancer Father   . Hyperlipidemia Father   . Dementia Father        Lewy Body Dementia  . Asthma Father   . Melanoma Father   . COPD Mother   . Heart disease Mother   . Stroke Mother   . Hypertension Mother   . Diabetes  Sister   . Hyperlipidemia Brother   . Hypertension Brother   . Cancer Maternal Grandmother 46       pancreatic  . Prostate cancer Neg Hx   . Kidney disease Neg Hx   . Breast cancer Neg Hx     Review of Systems: Noncontributory  PHYSICAL EXAM: Blood pressure 124/74, height 5\' 6"  (1.676 m), weight 156 lb (70.8 kg). CONSTITUTIONAL: Well-developed, well-nourished female in no acute distress.  HENT:  Normocephalic, atraumatic, External right and left ear normal. Oropharynx is clear and moist EYES: Conjunctivae and EOM are normal. Pupils are equal, round, and reactive to light. No scleral icterus.  NECK: Normal range of motion, supple, no masses SKIN: Skin is warm and dry. No rash noted. Not diaphoretic. No erythema. No pallor. [place]: Alert and oriented to person, place, and time. Normal reflexes, muscle tone coordination. No cranial nerve deficit noted. PSYCHIATRIC: Normal mood and affect. Normal behavior. Normal judgment and thought content. CARDIOVASCULAR: Normal heart rate noted, regular rhythm RESPIRATORY: Effort and breath sounds normal, no problems with respiration noted ABDOMEN: Soft, nontender, nondistended. PELVIC: Deferred MUSCULOSKELETAL: Normal range of motion. No edema and no tenderness. 2+ distal pulses.  Labs: No results found for this or any previous visit (from the past 336 hour(s)).  Imaging Studies: No results found.  Assessment:   ICD-10-CM   1. Menorrhagia with irregular cycle  N92.1      Plan: Patient will undergo surgical management with the above surgery.   The risks of surgery were discussed in detail with the patient including but not limited to: bleeding which may require transfusion or reoperation; infection which may require antibiotics; injury to surrounding organs which may involve bowel, bladder, ureters ; need for additional procedures including laparoscopy or laparotomy; thromboembolic phenomenon, surgical site problems and other  postoperative/anesthesia complications. Likelihood of success in alleviating the patient's condition was discussed. Routine postoperative instructions will be reviewed with the patient and her family in detail after surgery.  The patient concurred with the proposed plan, giving informed written consent for the surgery.    Preoperative prophylactic antibiotics, as indicated, and SCDs ordered on call to the OR.    Prentice Docker, MD 05/15/2020 8:18 AM

## 2020-05-15 NOTE — Patient Instructions (Addendum)
Your procedure is scheduled on: 05/21/20 - Tuesday Report to the Registration Desk on the 1st floor of the [facility]. To find out your arrival time, please call [phone] between 1PM - 3PM on: 05/20/20 - Monday Report to [facility] on 05/20/20 for Covid test and Labs at 8:10am.  REMEMBER: Instructions that are not followed completely may result in serious medical risk, up to and including death; or upon the discretion of your surgeon and anesthesiologist your surgery may need to be rescheduled.  Do not eat food after midnight the night before surgery.  No gum chewing, lozengers or hard candies.  You may however, drink CLEAR liquids up to 2 hours before you are scheduled to arrive for your surgery. Do not drink anything within 2 hours of your scheduled arrival time.  Clear liquids include: - water  - apple juice without pulp - gatorade (not RED, PURPLE, OR BLUE) - black coffee or tea (Do NOT add milk or creamers to the coffee or tea) Do NOT drink anything that is not on this list.  Type 1 and Type 2 diabetics should only drink water.  In addition, your doctor has ordered for you to drink the provided  Ensure Pre-Surgery Clear Carbohydrate Drink  Drinking this carbohydrate drink up to two hours before surgery helps to reduce insulin resistance and improve patient outcomes. Please complete drinking 2 hours prior to scheduled arrival time.  TAKE THESE MEDICATIONS THE MORNING OF SURGERY WITH A SIP OF WATER:  - buPROPion (WELLBUTRIN XL) 150 MG 24 hr tablet - pantoprazole (PROTONIX) 40 MG tablet, (take one the night before and one on the morning of surgery - helps to prevent nausea after surgery.)  Use inhaler PROAIR HFA 108 (90 Base) MCG/ACT  on the day of surgery and bring to the hospital.  One week prior to surgery: Stop taking beginning 05/15/20 Stop Anti-inflammatories (NSAIDS) such as Advil, Aleve, Ibuprofen, Motrin, Naproxen, Naprosyn and Aspirin based products such as  Excedrin, Goodys Powder, BC Powder.  Stop ANY OVER THE COUNTER supplements until after surgery.  No Alcohol for 24 hours before or after surgery.  No Smoking including e-cigarettes for 24 hours prior to surgery.  No chewable tobacco products for at least 6 hours prior to surgery.  No nicotine patches on the day of surgery.  Do not use any "recreational" drugs for at least a week prior to your surgery.  Please be advised that the combination of cocaine and anesthesia may have negative outcomes, up to and including death. If you test positive for cocaine, your surgery will be cancelled.  On the morning of surgery brush your teeth with toothpaste and water, you may rinse your mouth with mouthwash if you wish. Do not swallow any toothpaste or mouthwash.  Do not wear jewelry, make-up, hairpins, clips or nail polish.  Do not wear lotions, powders, or perfumes.   Do not shave body from the neck down 48 hours prior to surgery just in case you cut yourself which could leave a site for infection.  Also, freshly shaved skin may become irritated if using the CHG soap.  Contact lenses, hearing aids and dentures may not be worn into surgery.  Do not bring valuables to the hospital. [facility] is not responsible for any missing/lost belongings or valuables.   Use CHG Soap or wipes as directed on instruction sheet.  Notify your doctor if there is any change in your medical condition (cold, fever, infection).  Wear comfortable clothing (specific to  your surgery type) to the hospital.  Plan for stool softeners for home use; pain medications have a tendency to cause constipation. You can also help prevent constipation by eating foods high in fiber such as fruits and vegetables and drinking plenty of fluids as your diet allows.  After surgery, you can help prevent lung complications by doing breathing exercises.  Take deep breaths and cough every 1-2 hours. Your doctor may order a device called an  Incentive Spirometer to help you take deep breaths. When coughing or sneezing, hold a pillow firmly against your incision with both hands. This is called "splinting." Doing this helps protect your incision. It also decreases belly discomfort.  If you are being admitted to the hospital overnight, leave your suitcase in the car. After surgery it may be brought to your room.  If you are being discharged the day of surgery, you will not be allowed to drive home. You will need a responsible adult (18 years or older) to drive you home and stay with you that night.   If you are taking public transportation, you will need to have a responsible adult (18 years or older) with you. Please confirm with your physician that it is acceptable to use public transportation.   Please call the [facility] at [phone] if you have any questions about these instructions.  Surgery Visitation Policy:  Patients undergoing a surgery or procedure may have one family member or support person with them as long as that person is not COVID-19 positive or experiencing its symptoms.  That person may remain in the waiting area during the procedure.  Inpatient Visitation:    Visiting hours are 7 a.m. to 8 p.m. Inpatients will be allowed two visitors daily. The visitors may change each day during the patient's stay. No visitors under the age of 65. Any visitor under the age of 60 must be accompanied by an adult. The visitor must pass COVID-19 screenings, use hand sanitizer when entering and exiting the patient's room and wear a mask at all times, including in the patient's room. Patients must also wear a mask when staff or their visitor are in the room. Masking is required regardless of vaccination status.

## 2020-05-15 NOTE — H&P (View-Only) (Signed)
Preoperative History and Physical  Janice Donovan is a 55 y.o. [id number] here for surgical management of menorrhagia with irregular cycle.   No significant preoperative concerns.  History of Present Illness: 55 y.o. [id number] female who presents for pre-operative visit for irregular uterine bleeding. She had an endometrial ablation in 2020.    She had an ultrasound that showed:  Ultrasound demonstrates the following findings Adnexa: no masses seen  Uterus: retroverted with endometrial stripe  3.8 mm Additional: complex area seen within or near endometrial lining measuring 9 x 8 mm.   Pap smear 12/15/20221 - NILM, HPV negative Endometrial biopsy: not done due to endometrial ablation  Proposed surgery: Total Laparoscopic Hysterectomy, bilateral salpingectomy, cystoscopy  Past Medical History:  Diagnosis Date  . Adrenal nodule ([place])   . Allergic rhinitis   . Asthma   . Cervical polyp   . Constipation   . Generalized abdominal pain   . GERD (gastroesophageal reflux disease)   . History of mammogram 2015;01/02/15; 01/06/16   birads 1; birads 1; neg  . History of Papanicolaou smear of cervix 2011;03/10/15   neg; neg  . IBS (irritable bowel syndrome)   . Mood disorder ([place])   . Obesity   . Plantar fasciitis of left foot 09/29/2012  . Renal lesion   . Vaginal birth after cesarean 1993   Past Surgical History:  Procedure Laterality Date  . ANTERIOR CERVICAL DECOMP/DISCECTOMY FUSION  2005   C4-6  . CESAREAN SECTION  1990  . colonoscopy with polypectomy  06/2007; 2016   abd pain and chronic coinstipation  . DIAGNOSTIC LAPAROSCOPY     normal pelvis  . ESOPHAGOGASTRODUODENOSCOPY  06/2007; 2016   gastric polyps  . HYSTEROSCOPY WITH D & C N/A 10/13/2018   Procedure: DILATATION AND CURETTAGE /HYSTEROSCOPY;  Surgeon: Gae Dry, MD;  Location: [facility] ORS;  Service: Gynecology;  Laterality: N/A;  . LEEP  12/06/2008   cx polyp removal-path was HPV changes - [facility]   OB History  Gravida  Para Term Preterm AB Living  2 2 2     2   SAB IAB Ectopic Multiple Live Births          2    # Outcome Date GA Lbr Len/2nd Weight Sex Delivery Anes PTL Lv  2 Term 03/29/91   7 lb 3 oz (3.26 kg) M VBAC   LIV  1 Term 02/07/89   6 lb 12 oz (3.062 kg) F CS-LTranv   LIV  Patient denies any other pertinent gynecologic issues.   Current Outpatient Medications on File Prior to Visit  Medication Sig Dispense Refill  . buPROPion (WELLBUTRIN XL) 150 MG 24 hr tablet TAKE 1 TABLET BY MOUTH  DAILY 90 tablet 3  . Cholecalciferol (VITAMIN D3) 250 MCG (10000 UT) TABS Take 10,000 Units by mouth at bedtime.    . fexofenadine (ALLEGRA) 180 MG tablet Take 1 tablet (180 mg total) by mouth every evening. 90 tablet 3  . ibuprofen (ADVIL) 200 MG tablet Take 800 mg by mouth every 8 (eight) hours as needed (for pain.).    Marland Kitchen montelukast (SINGULAIR) 10 MG tablet Take 1 tablet (10 mg total) by mouth at bedtime. 90 tablet 3  . Multiple Vitamin (MULTIVITAMIN WITH MINERALS) TABS tablet Take 1 tablet by mouth at bedtime.    . pantoprazole (PROTONIX) 40 MG tablet Take 1 tablet (40 mg total) by mouth 2 (two) times daily. 180 tablet 3  . senna-docusate (SENOKOT-S) 8.6-50 MG per tablet Take 2 tablets by  mouth 2 (two) times daily.      No current facility-administered medications on file prior to visit.   Allergies  Allergen Reactions  . Erythromycin Swelling  . Bee Venom Hives and Swelling    Social History:   reports that she has never smoked. She has never used smokeless tobacco. She reports that she does not drink alcohol and does not use drugs.  Family History  Problem Relation Age of Onset  . Diabetes Father   . Hypertension Father   . Benign prostatic hyperplasia Father   . Bladder Cancer Father   . Hyperlipidemia Father   . Dementia Father        Lewy Body Dementia  . Asthma Father   . Melanoma Father   . COPD Mother   . Heart disease Mother   . Stroke Mother   . Hypertension Mother   . Diabetes  Sister   . Hyperlipidemia Brother   . Hypertension Brother   . Cancer Maternal Grandmother 63       pancreatic  . Prostate cancer Neg Hx   . Kidney disease Neg Hx   . Breast cancer Neg Hx     Review of Systems: Noncontributory  PHYSICAL EXAM: Blood pressure 124/74, height 5\' 6"  (1.676 m), weight 156 lb (70.8 kg). CONSTITUTIONAL: Well-developed, well-nourished female in no acute distress.  HENT:  Normocephalic, atraumatic, External right and left ear normal. Oropharynx is clear and moist EYES: Conjunctivae and EOM are normal. Pupils are equal, round, and reactive to light. No scleral icterus.  NECK: Normal range of motion, supple, no masses SKIN: Skin is warm and dry. No rash noted. Not diaphoretic. No erythema. No pallor. [place]: Alert and oriented to person, place, and time. Normal reflexes, muscle tone coordination. No cranial nerve deficit noted. PSYCHIATRIC: Normal mood and affect. Normal behavior. Normal judgment and thought content. CARDIOVASCULAR: Normal heart rate noted, regular rhythm RESPIRATORY: Effort and breath sounds normal, no problems with respiration noted ABDOMEN: Soft, nontender, nondistended. PELVIC: Deferred MUSCULOSKELETAL: Normal range of motion. No edema and no tenderness. 2+ distal pulses.  Labs: No results found for this or any previous visit (from the past 336 hour(s)).  Imaging Studies: No results found.  Assessment:   ICD-10-CM   1. Menorrhagia with irregular cycle  N92.1      Plan: Patient will undergo surgical management with the above surgery.   The risks of surgery were discussed in detail with the patient including but not limited to: bleeding which may require transfusion or reoperation; infection which may require antibiotics; injury to surrounding organs which may involve bowel, bladder, ureters ; need for additional procedures including laparoscopy or laparotomy; thromboembolic phenomenon, surgical site problems and other  postoperative/anesthesia complications. Likelihood of success in alleviating the patient's condition was discussed. Routine postoperative instructions will be reviewed with the patient and her family in detail after surgery.  The patient concurred with the proposed plan, giving informed written consent for the surgery.    Preoperative prophylactic antibiotics, as indicated, and SCDs ordered on call to the OR.    Prentice Docker, MD 05/15/2020 8:18 AM

## 2020-05-19 ENCOUNTER — Encounter: Payer: Self-pay | Admitting: Physician Assistant

## 2020-05-20 ENCOUNTER — Other Ambulatory Visit
Admission: RE | Admit: 2020-05-20 | Discharge: 2020-05-20 | Disposition: A | Discharge status: Home / Self Care | Payer: 59 | Source: Ambulatory Visit | Attending: Obstetrics and Gynecology | Admitting: Obstetrics and Gynecology

## 2020-05-20 ENCOUNTER — Other Ambulatory Visit: Payer: Self-pay

## 2020-05-20 DIAGNOSIS — Z20822 Contact with and (suspected) exposure to covid-19: Secondary | ICD-10-CM | POA: Insufficient documentation

## 2020-05-20 DIAGNOSIS — Z01812 Encounter for preprocedural laboratory examination: Secondary | ICD-10-CM | POA: Insufficient documentation

## 2020-05-20 LAB — COMPREHENSIVE METABOLIC PANEL
ALT: 18 U/L (ref 0–44)
AST: 19 U/L (ref 15–41)
Albumin: 4.5 g/dL (ref 3.5–5.0)
Alkaline Phosphatase: 71 U/L (ref 38–126)
Anion gap: 7 (ref 5–15)
BUN: 17 mg/dL (ref 6–20)
CO2: 29 mmol/L (ref 22–32)
Calcium: 9.7 mg/dL (ref 8.9–10.3)
Chloride: 104 mmol/L (ref 98–111)
Creatinine, Ser: 0.65 mg/dL (ref 0.44–1.00)
GFR, Estimated: >60 mL/min (ref >60)
Glucose, Bld: 99 mg/dL (ref 70–99)
Potassium: 3.8 mmol/L (ref 3.5–5.1)
Sodium: 140 mmol/L (ref 135–145)
Total Bilirubin: 1 mg/dL (ref 0.3–1.2)
Total Protein: 7.2 g/dL (ref 6.5–8.1)

## 2020-05-20 LAB — CBC
HCT: 39.4 % (ref 36.0–46.0)
Hemoglobin: 13.3 g/dL (ref 12.0–15.0)
MCH: 30.3 pg (ref 26.0–34.0)
MCHC: 33.8 g/dL (ref 30.0–36.0)
MCV: 89.7 fL (ref 80.0–100.0)
Platelets: 197 10*3/uL (ref 150–400)
RBC: 4.39 MIL/uL (ref 3.87–5.11)
RDW: 13.2 % (ref 11.5–15.5)
WBC: 4.4 10*3/uL (ref 4.0–10.5)
nRBC: 0 % (ref 0.0–0.2)

## 2020-05-20 LAB — TYPE AND SCREEN
ABO/RH(D): O POS
Antibody Screen: NEGATIVE

## 2020-05-20 LAB — SARS CORONAVIRUS 2 (TAT 6-24 HRS): SARS Coronavirus 2: NEGATIVE

## 2020-05-21 ENCOUNTER — Ambulatory Visit: Payer: 59 | Admitting: Registered Nurse

## 2020-05-21 ENCOUNTER — Encounter
Admission: RE | Disposition: A | Discharge status: Home / Self Care | Payer: Self-pay | Source: Home / Self Care | Attending: Obstetrics and Gynecology

## 2020-05-21 ENCOUNTER — Ambulatory Visit
Admission: RE | Admit: 2020-05-21 | Discharge: 2020-05-21 | Disposition: A | Discharge status: Home / Self Care | Payer: 59 | Attending: Obstetrics and Gynecology | Admitting: Obstetrics and Gynecology

## 2020-05-21 ENCOUNTER — Encounter: Payer: Self-pay | Admitting: Obstetrics and Gynecology

## 2020-05-21 DIAGNOSIS — N920 Excessive and frequent menstruation with regular cycle: Secondary | ICD-10-CM | POA: Insufficient documentation

## 2020-05-21 DIAGNOSIS — J4520 Mild intermittent asthma, uncomplicated: Secondary | ICD-10-CM

## 2020-05-21 DIAGNOSIS — Z881 Allergy status to other antibiotic agents status: Secondary | ICD-10-CM | POA: Insufficient documentation

## 2020-05-21 DIAGNOSIS — N921 Excessive and frequent menstruation with irregular cycle: Secondary | ICD-10-CM | POA: Diagnosis not present

## 2020-05-21 DIAGNOSIS — N800 Endometriosis of uterus: Secondary | ICD-10-CM | POA: Diagnosis not present

## 2020-05-21 DIAGNOSIS — N858 Other specified noninflammatory disorders of uterus: Secondary | ICD-10-CM | POA: Diagnosis not present

## 2020-05-21 DIAGNOSIS — N736 Female pelvic peritoneal adhesions (postinfective): Secondary | ICD-10-CM | POA: Insufficient documentation

## 2020-05-21 DIAGNOSIS — N838 Other noninflammatory disorders of ovary, fallopian tube and broad ligament: Secondary | ICD-10-CM | POA: Insufficient documentation

## 2020-05-21 DIAGNOSIS — K660 Peritoneal adhesions (postprocedural) (postinfection): Secondary | ICD-10-CM | POA: Diagnosis present

## 2020-05-21 DIAGNOSIS — Z79899 Other long term (current) drug therapy: Secondary | ICD-10-CM | POA: Insufficient documentation

## 2020-05-21 DIAGNOSIS — J301 Allergic rhinitis due to pollen: Secondary | ICD-10-CM

## 2020-05-21 DIAGNOSIS — Z91030 Bee allergy status: Secondary | ICD-10-CM | POA: Insufficient documentation

## 2020-05-21 HISTORY — PX: TOTAL LAPAROSCOPIC HYSTERECTOMY WITH SALPINGECTOMY: SHX6742

## 2020-05-21 HISTORY — PX: CYSTOSCOPY: SHX5120

## 2020-05-21 LAB — POCT PREGNANCY, URINE: Preg Test, Ur: NEGATIVE

## 2020-05-21 SURGERY — HYSTERECTOMY, TOTAL, LAPAROSCOPIC, WITH SALPINGECTOMY
Anesthesia: General

## 2020-05-21 MED ORDER — ONDANSETRON 4 MG PO TBDP: 4.0000 mg | ORAL_TABLET | Freq: Three times a day (TID) | ORAL | 0 refills | Status: DC | PRN

## 2020-05-21 MED ORDER — POVIDONE-IODINE 10 % EX SWAB: 2.0000 "application " | Freq: Once | CUTANEOUS | Status: DC

## 2020-05-21 MED ORDER — SEVOFLURANE IN SOLN
RESPIRATORY_TRACT | Status: AC
  Filled 2020-05-21: qty 250

## 2020-05-21 MED ORDER — PHENYLEPHRINE HCL (PRESSORS) 10 MG/ML IV SOLN
INTRAVENOUS | Status: DC | PRN
  Administered 2020-05-21: 100 ug via INTRAVENOUS
  Administered 2020-05-21: 200 ug via INTRAVENOUS

## 2020-05-21 MED ORDER — FENTANYL CITRATE (PF) 100 MCG/2ML IJ SOLN
INTRAMUSCULAR | Status: AC
  Administered 2020-05-21: 25 ug via INTRAVENOUS
  Filled 2020-05-21: qty 2

## 2020-05-21 MED ORDER — PHENYLEPHRINE HCL (PRESSORS) 10 MG/ML IV SOLN
INTRAVENOUS | Status: AC
  Filled 2020-05-21: qty 1

## 2020-05-21 MED ORDER — OXYCODONE-ACETAMINOPHEN 5-325 MG PO TABS: 1.0000 | ORAL_TABLET | Freq: Four times a day (QID) | ORAL | 0 refills | Status: DC | PRN

## 2020-05-21 MED ORDER — APREPITANT 40 MG PO CAPS
40.0000 mg | ORAL_CAPSULE | Freq: Once | ORAL | Status: AC
  Administered 2020-05-21: 40 mg via ORAL

## 2020-05-21 MED ORDER — OXYCODONE HCL 5 MG/5ML PO SOLN: 5.0000 mg | Freq: Once | ORAL | Status: DC | PRN

## 2020-05-21 MED ORDER — EPHEDRINE SULFATE 50 MG/ML IJ SOLN
INTRAMUSCULAR | Status: DC | PRN
  Administered 2020-05-21: 10 mg via INTRAVENOUS
  Administered 2020-05-21: 15 mg via INTRAVENOUS
  Administered 2020-05-21 (×2): 10 mg via INTRAVENOUS

## 2020-05-21 MED ORDER — BUPIVACAINE LIPOSOME 1.3 % IJ SUSP
INTRAMUSCULAR | Status: DC | PRN
  Administered 2020-05-21: 10 mL
  Administered 2020-05-21: 6 mL

## 2020-05-21 MED ORDER — APREPITANT 40 MG PO CAPS
ORAL_CAPSULE | ORAL | Status: AC
  Filled 2020-05-21: qty 1

## 2020-05-21 MED ORDER — ORAL CARE MOUTH RINSE: 15.0000 mL | Freq: Once | OROMUCOSAL | Status: AC

## 2020-05-21 MED ORDER — ONDANSETRON HCL 4 MG/2ML IJ SOLN
INTRAMUSCULAR | Status: DC | PRN
  Administered 2020-05-21 (×2): 4 mg via INTRAVENOUS

## 2020-05-21 MED ORDER — FLUTICASONE PROPIONATE 50 MCG/ACT NA SUSP: 2.0000 | Freq: Every day | NASAL | Status: DC | PRN

## 2020-05-21 MED ORDER — ACETAMINOPHEN 10 MG/ML IV SOLN
INTRAVENOUS | Status: AC
  Filled 2020-05-21: qty 100

## 2020-05-21 MED ORDER — CHLORHEXIDINE GLUCONATE 0.12 % MT SOLN
OROMUCOSAL | Status: AC
  Filled 2020-05-21: qty 15

## 2020-05-21 MED ORDER — PROPOFOL 10 MG/ML IV BOLUS
INTRAVENOUS | Status: DC | PRN
  Administered 2020-05-21: 120 mg via INTRAVENOUS

## 2020-05-21 MED ORDER — DEXAMETHASONE SODIUM PHOSPHATE 10 MG/ML IJ SOLN
INTRAMUSCULAR | Status: DC | PRN
  Administered 2020-05-21: 10 mg via INTRAVENOUS

## 2020-05-21 MED ORDER — MIDAZOLAM HCL 2 MG/2ML IJ SOLN
INTRAMUSCULAR | Status: AC
  Filled 2020-05-21: qty 2

## 2020-05-21 MED ORDER — SUGAMMADEX SODIUM 200 MG/2ML IV SOLN
INTRAVENOUS | Status: DC | PRN
  Administered 2020-05-21: 141.6 mg via INTRAVENOUS

## 2020-05-21 MED ORDER — PROAIR HFA 108 (90 BASE) MCG/ACT IN AERS: 2.0000 | INHALATION_SPRAY | RESPIRATORY_TRACT | Status: DC | PRN

## 2020-05-21 MED ORDER — LACTATED RINGERS IV SOLN: INTRAVENOUS | Status: DC

## 2020-05-21 MED ORDER — ROCURONIUM BROMIDE 100 MG/10ML IV SOLN
INTRAVENOUS | Status: DC | PRN
  Administered 2020-05-21 (×2): 50 mg via INTRAVENOUS
  Administered 2020-05-21: 20 mg via INTRAVENOUS

## 2020-05-21 MED ORDER — CEFAZOLIN SODIUM-DEXTROSE 2-4 GM/100ML-% IV SOLN
2.0000 g | INTRAVENOUS | Status: AC
  Administered 2020-05-21: 2 g via INTRAVENOUS

## 2020-05-21 MED ORDER — ROCURONIUM BROMIDE 10 MG/ML (PF) SYRINGE
PREFILLED_SYRINGE | INTRAVENOUS | Status: AC
  Filled 2020-05-21: qty 10

## 2020-05-21 MED ORDER — CHLORHEXIDINE GLUCONATE 0.12 % MT SOLN
15.0000 mL | Freq: Once | OROMUCOSAL | Status: AC
  Administered 2020-05-21: 15 mL via OROMUCOSAL

## 2020-05-21 MED ORDER — ACETAMINOPHEN 10 MG/ML IV SOLN
INTRAVENOUS | Status: DC | PRN
  Administered 2020-05-21: 1000 mg via INTRAVENOUS

## 2020-05-21 MED ORDER — OXYCODONE HCL 5 MG PO TABS: 5.0000 mg | ORAL_TABLET | Freq: Once | ORAL | Status: DC | PRN

## 2020-05-21 MED ORDER — LIDOCAINE HCL (CARDIAC) PF 100 MG/5ML IV SOSY
PREFILLED_SYRINGE | INTRAVENOUS | Status: DC | PRN
  Administered 2020-05-21: 80 mg via INTRAVENOUS

## 2020-05-21 MED ORDER — MIDAZOLAM HCL 2 MG/2ML IJ SOLN
INTRAMUSCULAR | Status: DC | PRN
  Administered 2020-05-21: 2 mg via INTRAVENOUS

## 2020-05-21 MED ORDER — IBUPROFEN 600 MG PO TABS: 600.0000 mg | ORAL_TABLET | Freq: Four times a day (QID) | ORAL | 0 refills | Status: DC

## 2020-05-21 MED ORDER — METHYLENE BLUE 0.5 % INJ SOLN
INTRAVENOUS | Status: AC
  Filled 2020-05-21: qty 10

## 2020-05-21 MED ORDER — PROPOFOL 10 MG/ML IV BOLUS
INTRAVENOUS | Status: AC
  Filled 2020-05-21: qty 20

## 2020-05-21 MED ORDER — FENTANYL CITRATE (PF) 100 MCG/2ML IJ SOLN
INTRAMUSCULAR | Status: AC
  Filled 2020-05-21: qty 2

## 2020-05-21 MED ORDER — EPHEDRINE 5 MG/ML INJ
INTRAVENOUS | Status: AC
  Filled 2020-05-21: qty 10

## 2020-05-21 MED ORDER — FENTANYL CITRATE (PF) 100 MCG/2ML IJ SOLN
25.0000 ug | INTRAMUSCULAR | Status: DC | PRN
  Administered 2020-05-21: 50 ug via INTRAVENOUS

## 2020-05-21 MED ORDER — FENTANYL CITRATE (PF) 100 MCG/2ML IJ SOLN
INTRAMUSCULAR | Status: DC | PRN
  Administered 2020-05-21: 50 ug via INTRAVENOUS
  Administered 2020-05-21: 25 ug via INTRAVENOUS

## 2020-05-21 MED ORDER — BUPIVACAINE LIPOSOME 1.3 % IJ SUSP
INTRAMUSCULAR | Status: AC
  Filled 2020-05-21: qty 20

## 2020-05-21 SURGICAL SUPPLY — 64 items
ADH SKN CLS APL DERMABOND .7 (GAUZE/BANDAGES/DRESSINGS) ×2
APL PRP STRL LF DISP 70% ISPRP (MISCELLANEOUS) ×2
APL SRG 38 LTWT LNG FL B (MISCELLANEOUS)
APPLICATOR ARISTA FLEXITIP XL (MISCELLANEOUS) IMPLANT
BAG DRN RND TRDRP ANRFLXCHMBR (UROLOGICAL SUPPLIES) ×2
BAG URINE DRAIN 2000ML AR STRL (UROLOGICAL SUPPLIES) ×3 IMPLANT
BLADE SURG SZ11 CARB STEEL (BLADE) ×3 IMPLANT
CATH FOLEY 2WAY  5CC 16FR (CATHETERS) ×3
CATH FOLEY 2WAY 5CC 16FR (CATHETERS) ×2
CATH URTH 16FR FL 2W BLN LF (CATHETERS) ×2 IMPLANT
CHLORAPREP W/TINT 26 (MISCELLANEOUS) ×3 IMPLANT
COVER WAND RF STERILE (DRAPES) ×3 IMPLANT
DEFOGGER SCOPE WARMER CLEARIFY (MISCELLANEOUS) ×3 IMPLANT
DERMABOND ADVANCED (GAUZE/BANDAGES/DRESSINGS) ×1
DERMABOND ADVANCED .7 DNX12 (GAUZE/BANDAGES/DRESSINGS) ×2 IMPLANT
DEVICE SUTURE ENDOST 10MM (ENDOMECHANICALS) IMPLANT
DRAPE 3/4 80X56 (DRAPES) ×3 IMPLANT
DRAPE LEGGINS SURG 28X43 STRL (DRAPES) ×3 IMPLANT
DRAPE UNDER BUTTOCK W/FLU (DRAPES) ×3 IMPLANT
DRESSING SURGICEL FIBRLLR 1X2 (HEMOSTASIS) ×2 IMPLANT
DRSG SURGICEL FIBRILLAR 1X2 (HEMOSTASIS) ×3
GAUZE 4X4 16PLY RFD (DISPOSABLE) ×3 IMPLANT
GLOVE SURG ENC MOIS LTX SZ7 (GLOVE) ×18 IMPLANT
GLOVE SURG UNDER LTX SZ7.5 (GLOVE) ×6 IMPLANT
GLOVE SURG UNDER POLY LF SZ7.5 (GLOVE) ×3 IMPLANT
GOWN STRL REUS W/ TWL LRG LVL3 (GOWN DISPOSABLE) ×12 IMPLANT
GOWN STRL REUS W/ TWL XL LVL3 (GOWN DISPOSABLE) ×2 IMPLANT
GOWN STRL REUS W/TWL LRG LVL3 (GOWN DISPOSABLE) ×18
GOWN STRL REUS W/TWL XL LVL3 (GOWN DISPOSABLE) ×3
GRASPER SUT TROCAR 14GX15 (MISCELLANEOUS) ×3 IMPLANT
HEMOSTAT ARISTA ABSORB 3G PWDR (HEMOSTASIS) IMPLANT
IRRIGATION STRYKERFLOW (MISCELLANEOUS) ×2 IMPLANT
IRRIGATOR STRYKERFLOW (MISCELLANEOUS) ×3
IV LACTATED RINGERS 1000ML (IV SOLUTION) ×6 IMPLANT
KIT PINK PAD W/HEAD ARE REST (MISCELLANEOUS) ×3
KIT PINK PAD W/HEAD ARM REST (MISCELLANEOUS) ×2 IMPLANT
KIT TURNOVER CYSTO (KITS) ×3 IMPLANT
LABEL OR SOLS (LABEL) ×3 IMPLANT
LIGASURE VESSEL 5MM BLUNT TIP (ELECTROSURGICAL) ×3 IMPLANT
MANIFOLD NEPTUNE II (INSTRUMENTS) ×3 IMPLANT
MANIPULATOR VCARE LG CRV RETR (MISCELLANEOUS) IMPLANT
MANIPULATOR VCARE SML CRV RETR (MISCELLANEOUS) ×3 IMPLANT
MANIPULATOR VCARE STD CRV RETR (MISCELLANEOUS) IMPLANT
NEEDLE HYPO 22GX1.5 SAFETY (NEEDLE) ×3 IMPLANT
OCCLUDER COLPOPNEUMO (BALLOONS) ×3 IMPLANT
PACK LAP CHOLECYSTECTOMY (MISCELLANEOUS) ×3 IMPLANT
PAD OB MATERNITY 4.3X12.25 (PERSONAL CARE ITEMS) ×3 IMPLANT
PAD PREP 24X41 OB/GYN DISP (PERSONAL CARE ITEMS) ×3 IMPLANT
SCISSORS METZENBAUM CVD 33 (INSTRUMENTS) ×3 IMPLANT
SET CYSTO W/LG BORE CLAMP LF (SET/KITS/TRAYS/PACK) ×3 IMPLANT
SET TRI-LUMEN FLTR TB AIRSEAL (TUBING) ×3 IMPLANT
SLEEVE ENDOPATH XCEL 5M (ENDOMECHANICALS) ×6 IMPLANT
SOL PREP PVP 2OZ (MISCELLANEOUS) ×3
SOLUTION PREP PVP 2OZ (MISCELLANEOUS) ×2 IMPLANT
SURGILUBE 2OZ TUBE FLIPTOP (MISCELLANEOUS) ×3 IMPLANT
SUT ENDO VLOC 180-0-8IN (SUTURE) ×3 IMPLANT
SUT MNCRL 4-0 (SUTURE) ×3
SUT MNCRL 4-0 27XMFL (SUTURE) ×2
SUT VIC AB 0 CT1 36 (SUTURE) ×3 IMPLANT
SUTURE MNCRL 4-0 27XMF (SUTURE) ×2 IMPLANT
SYR 10ML LL (SYRINGE) ×6 IMPLANT
SYR 50ML LL SCALE MARK (SYRINGE) ×3 IMPLANT
TROCAR PORT AIRSEAL 8X100 (TROCAR) ×3 IMPLANT
TROCAR XCEL NON-BLD 5MMX100MML (ENDOMECHANICALS) ×3 IMPLANT

## 2020-05-21 NOTE — Transfer of Care (Signed)
Immediate Anesthesia Transfer of Care Note  Patient: Janice Donovan  Procedure(s) Performed: TOTAL LAPAROSCOPIC HYSTERECTOMY WITH SALPINGECTOMY, LYSIS OF ADHESIONS (Bilateral ) CYSTOSCOPY (N/A )  Patient Location: PACU  Anesthesia Type:General  Level of Consciousness: sedated  Airway & Oxygen Therapy: Patient Spontanous Breathing and Patient connected to face mask oxygen  Post-op Assessment: Report given to RN and Post -op Vital signs reviewed and stable  Post vital signs: Reviewed and stable  Last Vitals:  Vitals Value Taken Time  BP 128/78 05/21/20 1300  Temp    Pulse 78 05/21/20 1301  Resp 24 05/21/20 1301  SpO2 100 % 05/21/20 1301  Vitals shown include unvalidated device data.  Last Pain:  Vitals:   05/21/20 0821  TempSrc: Temporal  PainSc: 0-No pain         Complications: No complications documented.

## 2020-05-21 NOTE — Anesthesia Postprocedure Evaluation (Signed)
Anesthesia Post Note  Patient: JAYLENNE HAMELIN  Procedure(s) Performed: TOTAL LAPAROSCOPIC HYSTERECTOMY WITH SALPINGECTOMY, LYSIS OF ADHESIONS (Bilateral ) CYSTOSCOPY (N/A )  Patient location during evaluation: PACU Anesthesia Type: General Level of consciousness: awake and alert Pain management: pain level controlled Vital Signs Assessment: post-procedure vital signs reviewed and stable Respiratory status: spontaneous breathing, nonlabored ventilation, respiratory function stable and patient connected to nasal cannula oxygen Cardiovascular status: blood pressure returned to baseline and stable Postop Assessment: no apparent nausea or vomiting Anesthetic complications: no   No complications documented.   Last Vitals:  Vitals:   05/21/20 1434 05/21/20 1447  BP:  128/71  Pulse: 76 87  Resp: 12 16  Temp:  (!) 36.2 C  SpO2: 98% 99%    Last Pain:  Vitals:   05/21/20 1447  TempSrc: Temporal  PainSc: 2                  Precious Haws Anella Nakata

## 2020-05-21 NOTE — Anesthesia Preprocedure Evaluation (Signed)
Anesthesia Evaluation  Patient identified by MRN, date of birth, ID band Patient awake    Reviewed: Allergy & Precautions, H&P , NPO status , Patient's Chart, lab work & pertinent test results  History of Anesthesia Complications (+) PONV and history of anesthetic complications  Airway Mallampati: II  TM Distance: >3 FB Neck ROM: full    Dental  (+) Chipped   Pulmonary asthma ,    Pulmonary exam normal        Cardiovascular negative cardio ROS Normal cardiovascular exam     Neuro/Psych negative neurological ROS  negative psych ROS   GI/Hepatic Neg liver ROS, GERD  ,  Endo/Other  negative endocrine ROS  Renal/GU Renal disease     Musculoskeletal   Abdominal   Peds  Hematology negative hematology ROS (+)   Anesthesia Other Findings Past Medical History: No date: Adrenal nodule ([place]) No date: Allergic rhinitis No date: Asthma No date: Cervical polyp No date: Constipation No date: Generalized abdominal pain No date: GERD (gastroesophageal reflux disease) 2015;01/02/15; 01/06/16: History of mammogram     Comment:  birads 1; birads 1; neg 2011;03/10/15: History of Papanicolaou smear of cervix     Comment:  neg; neg No date: IBS (irritable bowel syndrome) No date: Mood disorder ([place]) No date: Obesity 09/29/2012: Plantar fasciitis of left foot No date: Pneumonia     Comment:  2010 No date: PONV (postoperative nausea and vomiting)     Comment:  had nausea one time, hard to wake up many years ago No date: Renal lesion 1993: Vaginal birth after cesarean  Past Surgical History: 2005: ANTERIOR CERVICAL DECOMP/DISCECTOMY FUSION     Comment:  C4-6 1990: CESAREAN SECTION 06/2007; 2016: colonoscopy with polypectomy     Comment:  abd pain and chronic coinstipation No date: DIAGNOSTIC LAPAROSCOPY     Comment:  normal pelvis 06/2007; 2016: ESOPHAGOGASTRODUODENOSCOPY     Comment:  gastric polyps 10/13/2018:  HYSTEROSCOPY WITH D & C; N/A     Comment:  Procedure: DILATATION AND CURETTAGE /HYSTEROSCOPY;                Surgeon: Gae Dry, MD;  Location: [facility] ORS;                Service: Gynecology;  Laterality: N/A; 12/06/2008: LEEP     Comment:  cx polyp removal-path was HPV changes - [facility]     Reproductive/Obstetrics negative OB ROS                             Anesthesia Physical Anesthesia Plan  ASA: III  Anesthesia Plan: General ETT   Post-op Pain Management:    Induction: Intravenous  PONV Risk Score and Plan: Ondansetron, Dexamethasone, Midazolam and Treatment may vary due to age or medical condition  Airway Management Planned: Oral ETT  Additional Equipment:   Intra-op Plan:   Post-operative Plan: Extubation in OR  Informed Consent: I have reviewed the patients History and Physical, chart, labs and discussed the procedure including the risks, benefits and alternatives for the proposed anesthesia with the patient or authorized representative who has indicated his/her understanding and acceptance.     Dental Advisory Given  Plan Discussed with: Anesthesiologist, CRNA and Surgeon  Anesthesia Plan Comments: (Patient consented for risks of anesthesia including but not limited to:  - adverse reactions to medications - damage to eyes, teeth, lips or other oral mucosa - nerve damage due to positioning  - sore throat  or hoarseness - Damage to heart, brain, nerves, lungs, other parts of body or loss of life  Patient voiced understanding.)        Anesthesia Quick Evaluation

## 2020-05-21 NOTE — Op Note (Signed)
Operative Note    Name: Janice Donovan  Date of Service: 05/21/2020  DOB: 1965-09-05  MRN: [id number]   Pre-Operative Diagnosis: Menorrhagia with irregular cycle [N92.1]  Post-Operative Diagnosis:  1) Menorrhagia with irregular cycle [N92.1] 2) Intra-abdominal adhesions [K66.0]  Procedures:  1) Total Laparoscopic Hysterectomy, bilateral salpingectomy  2) Extensive lysis of adhesions of anterior uterus to anterior abdominal wall and lateral uterus to pelvic sidewall  3) Cystoscopy  Primary Surgeon: Prentice Docker, MD   Assistant Surgeon: Adrian Prows, MD - No other capable assistant available, in surgery requiring high level assistant.  EBL: 100 mL   IVF: 1,000 mL   Urine output: 450 mL clear urine at end of procedure  Specimens: Uterus with cervix and bilateral fallopian tubes  Drains: none  Complications: None   Disposition: PACU   Condition: Stable   Findings:  1) slightly enlarged uterus with normal appearing fallopian tubes and ovaries 2) uterus densely adherent to anterior abdominal wall and pelvic sidewalls 3) normal appearing bladder on cystoscopy with no defects in the bladder wall noted. Efflux of urine was noted bilaterally from the ureteral orifices.   Procedure Summary:  The patient was taken to the operating room where general anesthesia was administered and found to be adequate. She was placed in the dorsal supine lithotomy position in [place] stirrups and prepped and draped in usual sterile fashion. After a timeout was called, an indwelling catheter was placed in her bladder. A sterile speculum was placed in the vagina and a single-tooth tenaculum was used to grasp the anterior lip of the cervix. A V-Care uterine manipulator was affixed to the uterus in accordance to the manufacturers recommendations. The speculum and tenaculum was removed from the vagina.  Attention was turned to the abdomen where, after injection of local anesthetic, a 5 mm  infraumbilical incision was made with the scalpel. Entry into the abdomen was obtained via Optiview trocar technique (a blunt entry technique with camera visualization through the obturator upon entry). Verification of entry into the abdomen was obtained using opening pressures. The abdomen was insufflated with CO2. The camera was introduced through the trocar with verification of atraumatic entry. Left and right lower quadrant 5 mm ports were created via direct intra-abdominal camera visualization without difficulty.   After inspection of the abdomen and pelvis with the above-noted findings, the bilateral ureters were identified and found to be well away from the operative area of interest.   Of note, upon entering the abdomen through the umbilical incision there was scar tissue in this area.  After insertion of the right and left lower quadrant port sites this adhesive scar tissue was removed using monopolar and bipolar electrocautery.  Due to the adhesions of the uterus to the anterior abdominal wall in the pelvis the 8 mm suprapubic port was not yet placed.  The right fallopian tube was grasped at the fimbriated end and was transected using the LigaSure along the mesosalpinx in a lateral to medial fashion.   Targeting the posterior broad ligament as much of the peritoneum that could be cauterized and transected was accomplished.  Attention was turned to the left fallopian tube.  Using the LigaSure the fallopian tube was elevated and the mesosalpinx was cauterized and transected in a lateral to medial fashion.  The round ligament was able to be transected after cauterization.  The broad ligament was transected posteriorly down to the level of the internal cervical os.  I was able to identify the cervix at this point.  Scar tissue was removed to some extent from the anterior cervix on the left side.  Attention was turned to the fundal area of the uterus where gentle dissection took place to transect the  adhesive tissue from the uterus with great care to ensure the bladder was not damaged, as it was impossible to tell if the bladder was involved in this adhesive tissue.  A plane was established and with gentle retraction the adhesions were taken down.  Approximately 60 minutes was spent in total in transecting the adhesions and identifying the lower uterine segment anteriorly.  With the uterus free from the anterior abdominal wall, the 8 mm air seal port was placed without difficulty.  This was placed in the suprapubic region.  At this point the anterior portions of the broad ligaments were able to be transected to the level of the internal cervical os. The uterine arteries were skeletonized, ligated, and transected using the LigaSure device.  The colpotomy was performed using monopolar electrocautery in a circumferential fashion following the KOH ring.  The uterus and fallopian tubes and cervix were removed through the vagina.  After removing the uterus, the bladder was backfilled with sterile saline with 250 mL without obvious spillage in the abdominal cavity.  Closure of the vaginal cuff was undertaken using the V-lock stitch in a running fashion. All vascular pedicles were inspected and found to be hemostatic. Copious irrigation was undertaken. There was mild oozing along the right apex of the vaginal cuff.    Initially Arista was placed along the vascular pedicles.  However, hemostasis was not noted at the right vaginal cuff apex.  There was mild oozing in this area.  The bipolar electrocautery was utilized to cauterize this area after elevating the tissue away from the ureter.  Fibrillar was then placed through the ports and placed onto the vascular pedicles.  The abdominal pressure was dropped to 5 mmHg and hemostasis was verified.  The suprapubic trocar was removed and the fascia was reapproximated using #0 Vicryl with a single stitch. The abdomen was then desufflated of CO2 after removal of all  instruments. The suprapubic skin incision was closed using 4-0 Vicryl in a subcuticular fashion. The remaining skin incisions were closed using surgical skin glue and a layer of surgical skin glue was placed over suprapubic skin incision, as well.   Cystoscopy was undertaken at this point. The Foley catheter was removed and the 70 cystoscope was gently introduced through the urethra. The bladder survey was undertaken with efflux of urine from both orifices noted. There were no defects noted in the bladder wall. The cystoscope was removed and the Foley catheter was used to drain the bladder completely. The foley catheter was removed. The Vagina was swept using two fingers to verify that no instruments or sponges remained.  Also, no bleeding was noted.   The surgical assistant performed creation of a trocar site, retracted tissue, performed ligation and transection of tissue along the right adnexa and performed part of the colpotomy.  The assistant also assisted with closure of the vaginal cuff with retraction of the suture.  She also assisted in the closure of the skin incisions.    Further, she assisted in the lysis of adhesions with electrocautery, transection, and retraction.  She was absolutely essential in this regard.  The patient tolerated the procedure well.  Sponge, lap, needle, and instrument counts were correct x 2.  VTE prophylaxis: SCDs . Antibiotic prophylaxis: Ancef 2 grams prior to skin incision. She was  awakened in the operating room and was taken to the PACU in stable condition. The assistant surgeon was an MD due to lack of availability of another Counselling psychologist.   Prentice Docker, MD 05/21/2020 12:51 PM

## 2020-05-21 NOTE — Interval H&P Note (Signed)
History and Physical Interval Note:  05/21/2020 9:25 AM  Janice Donovan  has presented today for surgery, with the diagnosis of Menorrhagia with irregular cycle N92.1.  The various methods of treatment have been discussed with the patient and family. After consideration of risks, benefits and other options for treatment, the patient has consented to  Procedure(s): TOTAL LAPAROSCOPIC HYSTERECTOMY WITH SALPINGECTOMY (Bilateral) CYSTOSCOPY (N/A) as a surgical intervention.  The patient's history has been reviewed, patient examined, no change in status, stable for surgery.  I have reviewed the patient's chart and labs. After Reviewing Dr. Kenton Kingfisher' operative report in detail from 2020, it appears that she did not have an ablation.  I discussed this with her and offered an alternative surgery of hysteroscopy, dilation and curettage, in order to further biopsy the lining of her uterus and perhaps a hysterectomy might not be necessary.  I apologized for this oversight and offered her either a hysteroscopy with dilation and curettage, consider an ablation.  I also offered the prevoiusly planned hysterectomy.  She would like to proceed with hysterectomy because she does not want the issue to continue since she has had a hysteroscopy before and it did not seem to fix the issue. Questions were answered to the patient's satisfaction.  Based on our discussion, will proceed with the planned procedure of Total Laparoscopic Hysterectomy, bilateral salpingectomy with cystoscopy.  Prentice Docker, MD, [facility], [facility] 05/21/2020 9:28 AM

## 2020-05-21 NOTE — Discharge Instructions (Signed)

## 2020-05-21 NOTE — Anesthesia Procedure Notes (Signed)
Procedure Name: Intubation Date/Time: 05/21/2020 9:51 AM Performed by: Doreen Salvage, CRNA Pre-anesthesia Checklist: Patient identified, Patient being monitored, Timeout performed, Emergency Drugs available and Suction available Patient Re-evaluated:Patient Re-evaluated prior to induction Oxygen Delivery Method: Circle system utilized Preoxygenation: Pre-oxygenation with 100% oxygen Induction Type: IV induction Ventilation: Mask ventilation without difficulty Laryngoscope Size: Mac, 3 and McGraph Grade View: Grade I Tube type: Oral Tube size: 7.0 mm Number of attempts: 1 Airway Equipment and Method: Stylet Placement Confirmation: ETT inserted through vocal cords under direct vision,  positive ETCO2 and breath sounds checked- equal and bilateral Secured at: 21 cm Tube secured with: Tape Dental Injury: Teeth and Oropharynx as per pre-operative assessment

## 2020-05-22 ENCOUNTER — Telehealth: Payer: Self-pay | Admitting: Obstetrics and Gynecology

## 2020-05-22 ENCOUNTER — Encounter: Payer: Self-pay | Admitting: Obstetrics and Gynecology

## 2020-05-22 LAB — SURGICAL PATHOLOGY

## 2020-05-22 NOTE — Telephone Encounter (Signed)
Post-op telephone call.  Patient is alternating tylenol and ibuprofen.  She is tolerating PO.  She has had eaten some and drank plenty of fluid of fluids.  She is voiding OK with no blood in urine.

## 2020-05-31 ENCOUNTER — Other Ambulatory Visit: Payer: Self-pay

## 2020-05-31 ENCOUNTER — Ambulatory Visit (INDEPENDENT_AMBULATORY_CARE_PROVIDER_SITE_OTHER): Payer: 59 | Admitting: Obstetrics and Gynecology

## 2020-05-31 ENCOUNTER — Encounter: Payer: Self-pay | Admitting: Obstetrics and Gynecology

## 2020-05-31 VITALS — BP 126/74 | Ht 66.0 in | Wt 150.0 lb

## 2020-05-31 DIAGNOSIS — N921 Excessive and frequent menstruation with irregular cycle: Secondary | ICD-10-CM

## 2020-05-31 DIAGNOSIS — Z09 Encounter for follow-up examination after completed treatment for conditions other than malignant neoplasm: Secondary | ICD-10-CM

## 2020-05-31 NOTE — Progress Notes (Signed)
   Postoperative Follow-up Patient presents post op from Total Laparoscopic Hysterectomy, bilateral salpingectomy, cystoscopy  10 days ago for Menorrhagia with irregular cyle.  Subjective: Patient reports marked improvement in her preop symptoms. Eating a regular diet without difficulty. Pain is controlled with ibuprofen. Improving. She only had to take percocet on 2 occasions.  Activity: increasing slowly.  She denies fever, chills, nausea and vomiting. She is voiding without difficulty.    SURGICAL PATHOLOGY   DIAGNOSIS:  A. UTERUS, BILATERAL FALLOPIAN TUBES; HYSTERECTOMY AND BILATERAL  SALPINGECTOMY:  - BENIGN ECTOCERVICAL AND ENDOCERVICAL MUCOSA.  - BENIGN PROLIFERATIVE ENDOMETRIUM AND EXTENSIVE ADENOMYOSIS.  - SEROSAL FIBROUS ADHESIONS AND BENIGN CYSTS.  - BILATERAL BENIGN FALLOPIAN TUBES.  - BENIGN PARATUBAL CYSTS.   Objective: Vital Signs: BP 126/74   Ht 5\' 6"  (1.676 m)   Wt 150 lb (68 kg)   BMI 24.21 kg/m  Physical Exam Constitutional:      General: She is not in acute distress.    Appearance: Normal appearance.  HENT:     Head: Normocephalic and atraumatic.  Eyes:     General: No scleral icterus.    Conjunctiva/sclera: Conjunctivae normal.  Abdominal:     General: There is no distension.     Palpations: Abdomen is soft. There is no mass.     Tenderness: There is no abdominal tenderness. There is no guarding or rebound.     Hernia: No hernia is present.     Comments: Incisions: without erythema, induration, warmth, and tenderness. It is clean, dry, and intact.    Neurological:     General: No focal deficit present.     Mental Status: She is alert and oriented to person, place, and time.     Cranial Nerves: No cranial nerve deficit.  Psychiatric:        Mood and Affect: Mood normal.        Behavior: Behavior normal.        Judgment: Judgment normal.     Assessment: 55 y.o. s/p the above surgery progressing well  Plan: Patient has done well after surgery  with no apparent complications.  I have discussed the post-operative course to date, and the expected progress moving forward.  The patient understands what complications to be concerned about.  I will see the patient in routine follow up, or sooner if needed.    Activity plan: No heavy lifting. No intercourse fo 8-12 weeks post-op   Return in about 5 weeks (around 07/05/2020) for Six week post op visit.   Prentice Docker, MD  05/31/2020, 11:11 AM

## 2020-06-10 NOTE — Progress Notes (Signed)
06/10/2020  8:54 PM   Janice Donovan 16-Aug-1965 [id number]  Referring provider: Mar Daring, PA-C [address] [place],  [place] [address] No chief complaint on file.   HPI: Janice Donovan is a 55 y.o. female with a personal history of a renal cyst, who presents today for a renal cyst.  She reports that she recently saw her primary care physician and mentioned her history of renal cyst.  They felt that it would be reasonable to follow-up with urology.  Patient was found to have a 1.4 cm low-attenuation lesion in the interpolar right kidney and right adrenal nodule measuring 0.7 x 1.4 cm in 03/2014. Kidneys, ureters and bladder were otherwise unremarkable per this CT scan.  Ultrasound from 09/2014 found a 1.8 cm simple cyst involving the mid right kidney.  She also has a personal history of an adrenal nodule for which she underwent extensive endocrine evaluation in 2016.  This is deemed to be a nonfunctional adenoma.  Remained stable over the course of 68-month period.  No further evaluation was warranted.  Today the patient reports that she has been doing well.  She has no urinary issues.  She denies any flank pain.  No gross hematuria.   PMH: Past Medical History:  Diagnosis Date  . Adrenal nodule ([place])   . Allergic rhinitis   . Asthma   . Cervical polyp   . Constipation   . Generalized abdominal pain   . GERD (gastroesophageal reflux disease)   . History of mammogram 2015;01/02/15; 01/06/16   birads 1; birads 1; neg  . History of Papanicolaou smear of cervix 2011;03/10/15   neg; neg  . IBS (irritable bowel syndrome)   . Mood disorder ([place])   . Obesity   . Plantar fasciitis of left foot 09/29/2012  . Pneumonia    2010  . PONV (postoperative nausea and vomiting)    had nausea one time, hard to wake up many years ago  . Renal lesion   . Vaginal birth after cesarean 1993    Surgical History: Past Surgical History:  Procedure Laterality Date  .  ANTERIOR CERVICAL DECOMP/DISCECTOMY FUSION  2005   C4-6  . CESAREAN SECTION  1990  . colonoscopy with polypectomy  06/2007; 2016   abd pain and chronic coinstipation  . CYSTOSCOPY N/A 05/21/2020   Procedure: CYSTOSCOPY;  Surgeon: Will Bonnet, MD;  Location: [facility] ORS;  Service: Gynecology;  Laterality: N/A;  . DIAGNOSTIC LAPAROSCOPY     normal pelvis  . ESOPHAGOGASTRODUODENOSCOPY  06/2007; 2016   gastric polyps  . HYSTEROSCOPY WITH D & C N/A 10/13/2018   Procedure: DILATATION AND CURETTAGE /HYSTEROSCOPY;  Surgeon: Gae Dry, MD;  Location: [facility] OR[facility];  Service: Gynecology;  Laterality: N/A;  . LEEP  12/06/2008   cx polyp removal-path was HPV changes - CAK  . TOTAL LAPAROSCOPIC HYSTERECTOMY WITH SALPINGECTOMY Bilateral 05/21/2020   Procedure: TOTAL LAPAROSCOPIC HYSTERECTOMY WITH SALPINGECTOMY, LYSIS OF ADHESIONS;  Surgeon: Will Bonnet, MD;  Location: [facility] ORS;  Service: Gynecology;  Laterality: Bilateral;    Home Medications:  Allergies as of 06/12/2020      Reactions   Erythromycin Swelling   Bee Venom Hives, Swelling      Medication List       Accurate as of Jun 10, 2020  8:54 PM. If you have any questions, ask your nurse or doctor.        buPROPion 150 MG 24 hr tablet Commonly known as: WELLBUTRIN XL  TAKE 1 TABLET BY MOUTH  DAILY   fexofenadine 180 MG tablet Commonly known as: ALLEGRA Take 1 tablet (180 mg total) by mouth every evening.   fluticasone 50 MCG/ACT nasal spray Commonly known as: FLONASE Place 2 sprays into both nostrils daily as needed for allergies.   ibuprofen 600 MG tablet Commonly known as: ADVIL Take 1 tablet (600 mg total) by mouth every 6 (six) hours.   montelukast 10 MG tablet Commonly known as: SINGULAIR Take 1 tablet (10 mg total) by mouth at bedtime.   multivitamin with minerals Tabs tablet Take 1 tablet by mouth at bedtime.   ondansetron 4 MG disintegrating tablet Commonly known as: Zofran ODT Take 1 tablet (4 mg total) by  mouth every 8 (eight) hours as needed for nausea or vomiting.   oxyCODONE-acetaminophen 5-325 MG tablet Commonly known as: Percocet Take 1 tablet by mouth every 6 (six) hours as needed (breakthrough pain).   pantoprazole 40 MG tablet Commonly known as: PROTONIX Take 1 tablet (40 mg total) by mouth 2 (two) times daily.   ProAir HFA 108 (90 Base) MCG/ACT inhaler Generic drug: albuterol Inhale 2 puffs into the lungs every 4 (four) hours as needed (asthma).   senna-docusate 8.6-50 MG tablet Commonly known as: Senokot-S Take 2 tablets by mouth 2 (two) times daily.   Vitamin D3 250 MCG (10000 UT) Tabs Take 10,000 Units by mouth at bedtime.       Allergies: Allergies  Allergen Reactions  . Erythromycin Swelling  . Bee Venom Hives and Swelling    Family History: Family History  Problem Relation Age of Onset  . Diabetes Father   . Hypertension Father   . Benign prostatic hyperplasia Father   . Bladder Cancer Father   . Hyperlipidemia Father   . Dementia Father        Lewy Body Dementia  . Asthma Father   . Melanoma Father   . COPD Mother   . Heart disease Mother   . Stroke Mother   . Hypertension Mother   . Diabetes Sister   . Hyperlipidemia Brother   . Hypertension Brother   . Cancer Maternal Grandmother 73       pancreatic  . Prostate cancer Neg Hx   . Kidney disease Neg Hx   . Breast cancer Neg Hx     Social History:   reports that she has never smoked. She has never used smokeless tobacco. She reports that she does not drink alcohol and does not use drugs.  ROS: Pertinent ROS in HPI.  Physical Exam: There were no vitals taken for this visit.  Constitutional:  Alert and oriented, No acute distress. HEENT: [place] AT, moist mucus membranes.  Trachea midline, no masses. Cardiovascular: No clubbing, cyanosis, or edema. Respiratory: Normal respiratory effort, no increased work of breathing. Skin: No rashes, bruises or suspicious lesions. Neurologic: Grossly  intact, no focal deficits, moving all 4 extremities. Psychiatric: Normal mood and affect.  Laboratory Data:  Lab Results  Component Value Date   CREATININE 0.65 05/20/2020    I have reviewed the labs.  Pertinent Imaging: Previous cross-sectional imaging was reviewed, CT scan from 2019 appears to be consistent with benign cyst  Assessment & Plan:    1. Renal cyst Previous imaging was consistent with benign cyst however its been quite sometime since it was imaged  We discussed renal ultrasound, will call with results - Urinalysis, Complete - Ultrasound renal complete; Future  2. Adenoma of right adrenal gland Small incidental adrenal  nodule identified on cross-sectional imaging in 2016  Interval imaging after 6 months showed it remained stable  Functional analysis was negative  No further evaluation is needed but will also ensure that its not enlarged on renal ultrasound as above     Follow Up:  Will call with renal ultrasound results  I, Ardyth Gal, am acting as a scribe for Dr. Hollice Espy.    [place] [address] [place], [place] [address] [phone]

## 2020-06-12 ENCOUNTER — Encounter: Payer: Self-pay | Admitting: Urology

## 2020-06-12 ENCOUNTER — Other Ambulatory Visit: Payer: Self-pay

## 2020-06-12 ENCOUNTER — Ambulatory Visit: Payer: 59 | Admitting: Urology

## 2020-06-12 VITALS — BP 108/76 | HR 73 | Ht 66.0 in | Wt 150.0 lb

## 2020-06-12 DIAGNOSIS — D3501 Benign neoplasm of right adrenal gland: Secondary | ICD-10-CM

## 2020-06-12 DIAGNOSIS — N281 Cyst of kidney, acquired: Secondary | ICD-10-CM | POA: Diagnosis not present

## 2020-06-12 LAB — URINALYSIS, COMPLETE
Bilirubin, UA: NEGATIVE
Glucose, UA: NEGATIVE
Ketones, UA: NEGATIVE
Leukocytes,UA: NEGATIVE
Nitrite, UA: NEGATIVE
Protein,UA: NEGATIVE
RBC, UA: NEGATIVE
Specific Gravity, UA: 1.005 (ref 1.005–1.030)
Urobilinogen, Ur: 0.2 mg/dL (ref 0.2–1.0)
pH, UA: 5.5 (ref 5.0–7.5)

## 2020-06-12 LAB — MICROSCOPIC EXAMINATION
Bacteria, UA: NONE SEEN
Epithelial Cells (non renal): NONE SEEN /hpf (ref 0–10)

## 2020-06-12 NOTE — Patient Instructions (Signed)
Will call with results

## 2020-06-13 ENCOUNTER — Telehealth: Payer: Self-pay

## 2020-06-13 NOTE — Telephone Encounter (Signed)
Pt aware and appreciative  

## 2020-06-13 NOTE — Telephone Encounter (Signed)
Pt calling; had hyst 4/12; was told by SDJ if any sign of bleeding to let him know; last HS she started having pink colored d/c; just with wiping nothing on pad.  [phone]

## 2020-06-13 NOTE — Telephone Encounter (Signed)
That little bit is OK for a short while, if that's all there is.

## 2020-07-01 ENCOUNTER — Encounter: Payer: Self-pay | Admitting: Obstetrics and Gynecology

## 2020-07-01 ENCOUNTER — Other Ambulatory Visit: Payer: Self-pay

## 2020-07-01 ENCOUNTER — Ambulatory Visit: Payer: 59 | Admitting: Obstetrics and Gynecology

## 2020-07-01 VITALS — BP 120/76 | HR 80 | Ht 66.0 in | Wt 156.0 lb

## 2020-07-01 DIAGNOSIS — Z09 Encounter for follow-up examination after completed treatment for conditions other than malignant neoplasm: Secondary | ICD-10-CM

## 2020-07-01 NOTE — Progress Notes (Signed)
Postoperative Follow-up Patient presents post op from Total Laparoscopic Hysterectomy, bilateral salpingectomy, cystoscopy 6 weeks ago for menorrhagia with irregular cycle.  Subjective: Patient reports marked improvement in her preop symptoms. Eating a regular diet without difficulty. The patient is not having any pain.  Activity: normal activities of daily living.  She denies fever, chills, nausea and vomiting.   SURGICAL PATHOLOGY   DIAGNOSIS:  A. UTERUS, BILATERAL FALLOPIAN TUBES; HYSTERECTOMY AND BILATERAL  SALPINGECTOMY:  - BENIGN ECTOCERVICAL AND ENDOCERVICAL MUCOSA.  - BENIGN PROLIFERATIVE ENDOMETRIUM AND EXTENSIVE ADENOMYOSIS.  - SEROSAL FIBROUS ADHESIONS AND BENIGN CYSTS.  - BILATERAL BENIGN FALLOPIAN TUBES.  - BENIGN PARATUBAL CYSTS.   Objective: Vital Signs: BP 120/76 (Cuff Size: Normal)   Pulse 80   Ht 5\' 6"  (1.676 m)   Wt 156 lb (70.8 kg)   LMP 01/14/2020 (Exact Date)   BMI 25.18 kg/m  Physical Exam Constitutional:      General: She is not in acute distress.    Appearance: Normal appearance. She is well-developed.  Genitourinary:     Vulva, bladder and urethral meatus normal.     No lesions in the vagina.     Right Labia: No rash, tenderness, lesions, skin changes or Bartholin's cyst.    Left Labia: No tenderness, lesions, skin changes, Bartholin's cyst or rash.    No inguinal adenopathy present in the right or left side.    Pelvic Tanner Score: 5/5.    Vaginal cuff intact.    Perineal sutures intact.    No vaginal discharge, erythema, tenderness, bleeding, granulation tissue or cuff induration.     No vaginal prolapse present.     Right Adnexa: not tender, not full and no mass present.    Left Adnexa: not tender, not full and no mass present.    Cervix is absent.     Uterus is absent and anteverted.     No urethral tenderness or mass present.     Pelvic exam was performed with patient in the lithotomy position.  HENT:     Head: Normocephalic and  atraumatic.  Eyes:     General: No scleral icterus.    Conjunctiva/sclera: Conjunctivae normal.  Cardiovascular:     Rate and Rhythm: Normal rate and regular rhythm.     Heart sounds: No murmur heard. No friction rub. No gallop.   Pulmonary:     Effort: Pulmonary effort is normal. No respiratory distress.     Breath sounds: Normal breath sounds. No wheezing or rales.  Abdominal:     General: Bowel sounds are normal. There is no distension.     Palpations: Abdomen is soft. There is no mass.     Tenderness: There is no abdominal tenderness. There is no guarding or rebound.     Hernia: There is no hernia in the left inguinal area or right inguinal area.  Musculoskeletal:        General: Normal range of motion.     Cervical back: Normal range of motion and neck supple.  Lymphadenopathy:     Lower Body: No right inguinal adenopathy. No left inguinal adenopathy.  Neurological:     General: No focal deficit present.     Mental Status: She is alert and oriented to person, place, and time.     Cranial Nerves: No cranial nerve deficit.  Skin:    General: Skin is warm and dry.     Findings: No erythema.  Psychiatric:        Mood and Affect:  Mood normal.        Behavior: Behavior normal.        Judgment: Judgment normal.      Assessment: 55 y.o. s/p above surgery progressing well  Plan: Patient has done well after surgery with no apparent complications.  I have discussed the post-operative course to date, and the expected progress moving forward.  The patient understands what complications to be concerned about.  I will see the patient in routine follow up, or sooner if needed.    Activity plan: No restriction. Recommend pelvic rest until 8-12 weeks post-op  Prentice Docker, MD  07/01/2020, 8:19 AM

## 2020-07-10 ENCOUNTER — Ambulatory Visit: Payer: 59

## 2020-07-31 ENCOUNTER — Ambulatory Visit
Admission: RE | Admit: 2020-07-31 | Discharge: 2020-07-31 | Disposition: A | Discharge status: Home / Self Care | Payer: 59 | Source: Ambulatory Visit | Attending: Urology | Admitting: Urology

## 2020-07-31 ENCOUNTER — Other Ambulatory Visit: Payer: Self-pay

## 2020-07-31 DIAGNOSIS — N281 Cyst of kidney, acquired: Secondary | ICD-10-CM | POA: Diagnosis not present

## 2020-08-13 ENCOUNTER — Telehealth: Payer: Self-pay | Admitting: *Deleted

## 2020-08-13 NOTE — Telephone Encounter (Signed)
-----   Message from Hollice Espy, MD sent at 08/03/2020  8:53 PM EDT ----- RUS looks good.  Some interval enlargement of cyst but remains simple and not concerning.  Hollice Espy, MD

## 2020-08-13 NOTE — Telephone Encounter (Addendum)
Patient advised-voiced understanding   ----- Message from Hollice Espy, MD sent at 08/03/2020  8:53 PM EDT ----- RUS looks good.  Some interval enlargement of cyst but remains simple and not concerning.  Hollice Espy, MD

## 2020-12-20 ENCOUNTER — Other Ambulatory Visit: Payer: Self-pay | Admitting: Physician Assistant

## 2020-12-20 DIAGNOSIS — K2100 Gastro-esophageal reflux disease with esophagitis, without bleeding: Secondary | ICD-10-CM

## 2020-12-30 ENCOUNTER — Encounter: Payer: Self-pay | Admitting: Obstetrics

## 2020-12-30 ENCOUNTER — Other Ambulatory Visit: Payer: Self-pay | Admitting: Obstetrics

## 2020-12-30 DIAGNOSIS — Z1231 Encounter for screening mammogram for malignant neoplasm of breast: Secondary | ICD-10-CM

## 2021-01-08 ENCOUNTER — Other Ambulatory Visit: Payer: Self-pay | Admitting: Obstetrics

## 2021-01-08 DIAGNOSIS — Z1231 Encounter for screening mammogram for malignant neoplasm of breast: Secondary | ICD-10-CM

## 2021-01-09 ENCOUNTER — Other Ambulatory Visit: Payer: Self-pay

## 2021-01-09 ENCOUNTER — Encounter: Payer: Self-pay | Admitting: Family Medicine

## 2021-01-09 ENCOUNTER — Ambulatory Visit: Payer: 59 | Admitting: Family Medicine

## 2021-01-09 VITALS — BP 116/75 | HR 73 | Temp 98.3°F | Wt 159.1 lb

## 2021-01-09 DIAGNOSIS — F419 Anxiety disorder, unspecified: Secondary | ICD-10-CM

## 2021-01-09 DIAGNOSIS — K2100 Gastro-esophageal reflux disease with esophagitis, without bleeding: Secondary | ICD-10-CM

## 2021-01-09 DIAGNOSIS — Z23 Encounter for immunization: Secondary | ICD-10-CM

## 2021-01-09 DIAGNOSIS — J301 Allergic rhinitis due to pollen: Secondary | ICD-10-CM

## 2021-01-09 DIAGNOSIS — J4520 Mild intermittent asthma, uncomplicated: Secondary | ICD-10-CM | POA: Diagnosis not present

## 2021-01-09 MED ORDER — MONTELUKAST SODIUM 10 MG PO TABS: 10.0000 mg | ORAL_TABLET | Freq: Every day | ORAL | 3 refills | Status: DC

## 2021-01-09 MED ORDER — FEXOFENADINE HCL 180 MG PO TABS: 180.0000 mg | ORAL_TABLET | Freq: Every evening | ORAL | 3 refills | Status: AC

## 2021-01-09 MED ORDER — BUPROPION HCL ER (XL) 150 MG PO TB24: ORAL_TABLET | ORAL | 3 refills | Status: DC

## 2021-01-09 MED ORDER — PROAIR HFA 108 (90 BASE) MCG/ACT IN AERS: 2.0000 | INHALATION_SPRAY | RESPIRATORY_TRACT | 1 refills | Status: DC | PRN

## 2021-01-09 MED ORDER — FLUTICASONE PROPIONATE 50 MCG/ACT NA SUSP: 2.0000 | Freq: Every day | NASAL | 3 refills | Status: DC | PRN

## 2021-01-09 MED ORDER — PANTOPRAZOLE SODIUM 40 MG PO TBEC: 40.0000 mg | DELAYED_RELEASE_TABLET | Freq: Two times a day (BID) | ORAL | 3 refills | Status: DC

## 2021-01-09 NOTE — Progress Notes (Signed)
Established patient visit   Patient: Janice Donovan   DOB: 1965-03-21   55 y.o. Female  MRN: [id number] Visit Date: 01/09/2021  Today's healthcare provider: Lavon Paganini, MD   Chief Complaint  Patient presents with   Follow-up    Subjective    HPI  Depression, Follow-up  She  was last seen for this 8 months ago. Changes made at last visit include Continue current medical treatment plan. - buPROPion (WELLBUTRIN XL) 150 MG 24 hr tablet.   She reports excellent compliance with treatment. She is not having side effects.   She reports excellent tolerance of treatment. Current symptoms include:  none She feels she is Improved since last visit.  Depression screen [facility] 2/9 01/09/2021 05/10/2020 01/22/2020  Decreased Interest 0 0 0  Down, Depressed, Hopeless 0 0 0  PHQ - 2 Score 0 0 0  Altered sleeping 0 0 1  Tired, decreased energy 0 0 0  Change in appetite 0 0 0  Feeling bad or failure about yourself  0 0 0  Trouble concentrating 0 0 0  Moving slowly or fidgety/restless 0 0 0  Suicidal thoughts 0 0 0  PHQ-9 Score 0 0 1  Difficult doing work/chores Not difficult at all Not difficult at all Not difficult at all    -----------------------------------------------------------------------------------------  GERD, Follow up:  The patient was last seen for GERD 8 months ago. Changes made since that visit include continue current medical treatment plan.  She reports excellent compliance with treatment. She is not having side effects.   She IS experiencing  no symptoms .    Asthma/allergies: Patient needs refill on her Montelukast, PROAIR,Flonase.  Medications: Outpatient Medications Prior to Visit  Medication Sig Note   Cholecalciferol (VITAMIN D3) 250 MCG (10000 UT) TABS Take 10,000 Units by mouth at bedtime. 01/09/2021: prn   Multiple Vitamin (MULTIVITAMIN WITH MINERALS) TABS tablet Take 1 tablet by mouth at bedtime.    senna-docusate (SENOKOT-S) 8.6-50 MG per  tablet Take 2 tablets by mouth 2 (two) times daily.     [DISCONTINUED] buPROPion (WELLBUTRIN XL) 150 MG 24 hr tablet TAKE 1 TABLET BY MOUTH  DAILY    [DISCONTINUED] fexofenadine (ALLEGRA) 180 MG tablet Take 1 tablet (180 mg total) by mouth every evening.    [DISCONTINUED] fluticasone (FLONASE) 50 MCG/ACT nasal spray Place 2 sprays into both nostrils daily as needed for allergies. 01/09/2021: prn   [DISCONTINUED] montelukast (SINGULAIR) 10 MG tablet Take 1 tablet (10 mg total) by mouth at bedtime.    [DISCONTINUED] pantoprazole (PROTONIX) 40 MG tablet Take 1 tablet (40 mg total) by mouth 2 (two) times daily.    [DISCONTINUED] PROAIR HFA 108 (90 Base) MCG/ACT inhaler Inhale 2 puffs into the lungs every 4 (four) hours as needed (asthma).    No facility-administered medications prior to visit.    Review of Systems  Constitutional:  Negative for appetite change, chills, fatigue and fever.  Respiratory:  Negative for chest tightness and shortness of breath.   Cardiovascular:  Negative for chest pain and palpitations.  Gastrointestinal:  Negative for abdominal pain, nausea and vomiting.  Neurological:  Negative for dizziness and weakness.      Objective    BP 116/75 (BP Location: Right Arm, Patient Position: Sitting, Cuff Size: Normal)   Pulse 73   Temp 98.3 F (36.8 C) (Oral)   Wt 159 lb 1.6 oz (72.2 kg)   LMP 01/14/2020 (Exact Date)   SpO2 100%   BMI 25.68 kg/m  {  Show previous vital signs (optional):23777}  Physical Exam Vitals reviewed.  Constitutional:      General: She is not in acute distress.    Appearance: Normal appearance. She is well-developed. She is not diaphoretic.  HENT:     Head: Normocephalic and atraumatic.  Eyes:     General: No scleral icterus.    Conjunctiva/sclera: Conjunctivae normal.  Neck:     Thyroid: No thyromegaly.  Cardiovascular:     Rate and Rhythm: Normal rate and regular rhythm.     Pulses: Normal pulses.     Heart sounds: Normal heart sounds.  No murmur heard. Pulmonary:     Effort: Pulmonary effort is normal. No respiratory distress.     Breath sounds: Normal breath sounds. No wheezing, rhonchi or rales.  Musculoskeletal:     Cervical back: Neck supple.     Right lower leg: No edema.     Left lower leg: No edema.  Lymphadenopathy:     Cervical: No cervical adenopathy.  Skin:    General: Skin is warm and dry.     Findings: No rash.  Neurological:     Mental Status: She is alert and oriented to person, place, and time. Mental status is at baseline.  Psychiatric:        Mood and Affect: Mood normal.        Behavior: Behavior normal.      No results found for any visits on 01/09/21.  Assessment & Plan     Problem List Items Addressed This Visit       Respiratory   Allergic rhinitis    Chronic and stable Continue antihistamine, Flonase, Singulair      Airway hyperreactivity    Chronic and stable with no recent exacerbation Continue albuterol as needed Continue Singulair      Relevant Medications   montelukast (SINGULAIR) 10 MG tablet   PROAIR HFA 108 (90 Base) MCG/ACT inhaler     Digestive   Acid reflux    Chronic and stable Continue PPI Discussed diet      Relevant Medications   pantoprazole (PROTONIX) 40 MG tablet     Other   Anxiety - Primary    Chronic and stable Continue Wellbutrin at current dose      Relevant Medications   buPROPion (WELLBUTRIN XL) 150 MG 24 hr tablet   Other Visit Diagnoses     Need for pneumococcal vaccination       Relevant Orders   Pneumococcal conjugate vaccine 20-valent (Completed)        Return in about 1 year (around 01/09/2022) for CPE, With new PCP.      I, Lavon Paganini, MD, have reviewed all documentation for this visit. The documentation on 01/09/21 for the exam, diagnosis, procedures, and orders are all accurate and complete.   Myelle Poteat, Dionne Bucy, MD, MPH [facility]

## 2021-01-09 NOTE — Assessment & Plan Note (Signed)
Chronic and stable Continue Wellbutrin at current dose

## 2021-01-09 NOTE — Assessment & Plan Note (Signed)
Chronic and stable Continue PPI Discussed diet

## 2021-01-09 NOTE — Assessment & Plan Note (Signed)
Chronic and stable with no recent exacerbation Continue albuterol as needed Continue Singulair

## 2021-01-09 NOTE — Assessment & Plan Note (Signed)
Chronic and stable Continue antihistamine, Flonase, Singulair

## 2021-01-24 ENCOUNTER — Other Ambulatory Visit: Payer: Self-pay

## 2021-01-24 ENCOUNTER — Ambulatory Visit
Admission: RE | Admit: 2021-01-24 | Discharge: 2021-01-24 | Disposition: A | Discharge status: Home / Self Care | Payer: 59 | Source: Ambulatory Visit | Attending: Obstetrics | Admitting: Obstetrics

## 2021-01-24 DIAGNOSIS — Z1231 Encounter for screening mammogram for malignant neoplasm of breast: Secondary | ICD-10-CM | POA: Diagnosis not present

## 2021-01-27 ENCOUNTER — Other Ambulatory Visit: Payer: Self-pay

## 2021-01-27 ENCOUNTER — Ambulatory Visit (INDEPENDENT_AMBULATORY_CARE_PROVIDER_SITE_OTHER): Payer: 59 | Admitting: Obstetrics

## 2021-01-27 ENCOUNTER — Other Ambulatory Visit: Payer: Self-pay | Admitting: Obstetrics

## 2021-01-27 ENCOUNTER — Encounter: Payer: Self-pay | Admitting: Obstetrics

## 2021-01-27 VITALS — BP 126/84 | Ht 66.0 in | Wt 160.0 lb

## 2021-01-27 DIAGNOSIS — Z01419 Encounter for gynecological examination (general) (routine) without abnormal findings: Secondary | ICD-10-CM

## 2021-01-27 DIAGNOSIS — R7309 Other abnormal glucose: Secondary | ICD-10-CM

## 2021-01-27 DIAGNOSIS — N393 Stress incontinence (female) (male): Secondary | ICD-10-CM | POA: Diagnosis not present

## 2021-01-27 DIAGNOSIS — Z0000 Encounter for general adult medical examination without abnormal findings: Secondary | ICD-10-CM

## 2021-01-27 NOTE — Progress Notes (Signed)
Gynecology Annual Exam  PCP: Gwyneth Sprout, FNP  Chief Complaint:  Chief Complaint  Patient presents with   Annual Exam    History of Present Illness:Patient is a 55 y.o. G2P2002 presents for annual exam. The patient has no complaints today.   LMP: Patient's last menstrual period was 01/14/2020 (exact date).She has had a hysterectomy by Glennon Mac this past year. Was advised to be seenin April of 2023 to have pelvic evaluation by one of the MDs post hysterectomy. She plans to return then for an exam. She is requesting Health Maintenance labs today.  Just had a mammogram last week.  The patient is sexually active. She admits to dyspareunia.  The patient does perform self breast exams.  There is no notable family history of breast or ovarian cancer in her family.  The patient wears seatbelts: yes.   The patient has regular exercise:  Yes, she walks daily. Works from home virtually. .    The patient denies current symptoms of depression.     Review of Systems: Review of Systems  Constitutional: Negative.   HENT: Negative.    Eyes: Negative.   Respiratory: Negative.    Cardiovascular: Negative.   Gastrointestinal: Negative.   Genitourinary: Negative.   Musculoskeletal: Negative.   Skin: Negative.   Neurological: Negative.   Endo/Heme/Allergies: Negative.   Psychiatric/Behavioral: Negative.     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   History of  colon polyps 11/21/2007   Anemia, iron deficiency 06/09/2007   Acid reflux 06/09/2007   IBS (irritable bowel syndrome) 06/09/2007    Past Surgical History:  Past Surgical History:  Procedure Laterality Date   ANTERIOR CERVICAL DECOMP/DISCECTOMY FUSION  2005   [place]   colonoscopy with polypectomy  06/2007; 2016   abd pain and chronic coinstipation   CYSTOSCOPY N/A 05/21/2020   Procedure: CYSTOSCOPY;  Surgeon: Will Bonnet, MD;  Location: [facility];  Service: Gynecology;  Laterality: N/A;   DIAGNOSTIC LAPAROSCOPY     normal pelvis   ESOPHAGOGASTRODUODENOSCOPY  06/2007; 2016   gastric polyps   HYSTEROSCOPY WITH D & C N/A 10/13/2018   Procedure: DILATATION AND CURETTAGE /HYSTEROSCOPY;  Surgeon: Gae Dry, MD;  Location: [facility];  Service: Gynecology;  Laterality: N/A;   LEEP  12/06/2008   cx polyp removal-path was HPV changes - [facility]   TOTAL LAPAROSCOPIC HYSTERECTOMY WITH SALPINGECTOMY Bilateral 05/21/2020   Procedure: TOTAL LAPAROSCOPIC HYSTERECTOMY WITH SALPINGECTOMY, LYSIS OF ADHESIONS;  Surgeon: Will Bonnet, MD;  Location: [facility] OR[facility];  Service: Gynecology;  Laterality: Bilateral;    Gynecologic History:  Patient's last menstrual period was 01/14/2020 (exact date). Last Pap: Results were: 2021 no abnormalities  Last mammogram: 01/20/2021 Results were:  pending  Obstetric History: G2P2002  Family History:  Family History  Problem Relation Age of Onset   Diabetes Father    Hypertension Father    Benign prostatic hyperplasia Father    Bladder Cancer Father  Hyperlipidemia Father    Dementia Father        Lewy Body Dementia   Asthma Father    Melanoma Father    COPD Mother    Heart disease Mother    Stroke Mother    Hypertension Mother    Diabetes Sister    Hyperlipidemia Brother    Hypertension Brother    Cancer Maternal Grandmother 41       pancreatic   Prostate cancer Neg Hx    Kidney disease Neg Hx    Breast cancer Neg Hx      Social History:  Social History   Socioeconomic History   Marital status: Married    Spouse name: Not on file   Number of children: 2   Years of education: H/S   Highest education level: Not on file  Occupational History   Occupation: Programme researcher, broadcasting/film/video: [employer]  Tobacco Use   Smoking status: Never   Smokeless tobacco: Never  Vaping Use   Vaping Use: Never used  Substance and Sexual Activity   Alcohol use: No    Alcohol/week: 0.0 standard drinks   Drug use: No   Sexual activity: Yes    Partners: Male    Birth control/protection: None, Surgical    Comment: ablation  Other Topics Concern   Not on file  Social History Narrative   Amna's daughter Apolonio Schneiders has 3 children -ages 69,2,and 53(2018). Her son is a Education officer, museum.   Social Determinants of Health   Financial Resource Strain: Not on file  Food Insecurity: Not on file  Transportation Needs: Not on file  Physical Activity: Not on file  Stress: Not on file  Social Connections: Not on file  Intimate Partner Violence: Not on file    Allergies:  Allergies  Allergen Reactions   Erythromycin Swelling   Bee Venom Hives and Swelling    Medications: Prior to Admission medications   Medication Sig Start Date End Date Taking? Authorizing Provider  buPROPion (WELLBUTRIN XL) 150 MG 24 hr tablet TAKE 1 TABLET BY MOUTH  DAILY 01/09/21   Virginia Crews, MD  Cholecalciferol (VITAMIN D3) 250 MCG (10000 UT) TABS Take 10,000 Units by mouth at bedtime.    [provider]  fexofenadine (ALLEGRA) 180 MG tablet Take 1 tablet (180 mg total) by mouth every evening. 01/09/21   Virginia Crews, MD  fluticasone (FLONASE) 50 MCG/ACT nasal spray Place 2 sprays into both nostrils daily as needed for allergies. 01/09/21   Virginia Crews, MD  montelukast (SINGULAIR) 10 MG tablet Take 1 tablet (10 mg total) by mouth at bedtime. 01/09/21   Virginia Crews, MD  Multiple Vitamin (MULTIVITAMIN WITH  MINERALS) TABS tablet Take 1 tablet by mouth at bedtime.    [provider]  pantoprazole (PROTONIX) 40 MG tablet Take 1 tablet (40 mg total) by mouth 2 (two) times daily. 01/09/21   Virginia Crews, MD  PROAIR HFA 108 [phone] Base) MCG/ACT inhaler Inhale 2 puffs into the lungs every 4 (four) hours as needed (asthma). 01/09/21   Virginia Crews, MD  senna-docusate (SENOKOT-S) 8.6-50 MG per tablet Take 2 tablets by mouth 2 (two) times daily.     [provider]    Physical Exam Vitals: Last menstrual period 01/14/2020.  General: NAD HEENT: normocephalic, anicteric Thyroid: no enlargement, no palpable nodules Pulmonary: No increased work of breathing, CTAB Cardiovascular: RRR, distal pulses 2+ Breast: A-B cup size,Breast symmetrical, no tenderness, no palpable nodules or  masses, no skin or nipple retraction present, no nipple discharge.  No axillary or supraclavicular lymphadenopathy. Abdomen: NABS, soft, non-tender, non-distended.  Umbilicus without lesions.  No hepatomegaly, splenomegaly or masses palpable. No evidence of hernia  Genitourinary:  External: Normal external female genitalia.  Normal urethral meatus, normal Bartholin's and Skene's glands.  Reports vaginal dryness-   Full pelvic deferred today. Extremities: no edema, erythema, or tenderness Neurologic: Grossly intact Psychiatric: mood appropriate, affect full  Female chaperone present for pelvic and breast  portions of the physical exam     Assessment: 55 y.o. [id number] routine annual exam H/O hysterectomy with bilateral salpingectomy. Vaginal dryness  Plan: Problem List Items Addressed This Visit   None   1) Mammogram - recommend yearly screening mammogram.  Mammogram Is up to date  2) STI screening  was notoffered and therefore not obtained  3) ASCCP guidelines and rational discussed.  Patient opts for every 5 years screening interval  4) Osteoporosis  - per USPTF routine screening DEXA at  age 36   Consider FDA-approved medical therapies in postmenopausal women and men aged 72 years and older, based on the following: a) A hip or vertebral (clinical or morphometric) fracture b) T-score ? -2.5 at the femoral neck or spine after appropriate evaluation to exclude secondary causes C) Low bone mass (T-score between -1.0 and -2.5 at the femoral neck or spine) and a 10-year probability of a hip fracture ? 3% or a 10-year probability of a major osteoporosis-related fracture ? 20% based on the [place]-adapted WHO algorithm   5) Routine healthcare maintenance including cholesterol, diabetes screening discussed Ordered today  6) Colonoscopy is up to date. She has IBS, and has her colonoscopies every 5 years..  Screening recommended starting at age 50 for average risk individuals, age 82 for individuals deemed at increased risk (including African Americans) and recommended to continue until age 19.  For patient age 73-85 individualized approach is recommended.  Gold standard screening is via colonoscopy, Cologuard screening is an acceptable alternative for patient unwilling or unable to undergo colonoscopy.  "Colorectal cancer screening for average?risk adults: 2018 guideline update from the American Cancer Society"CA: A Cancer Journal for Clinicians: Jul 08, 2016   7) No follow-ups on file. She continues to have some stress incontinence, and even after her GYN surgery. Referral to Uro gyn made for her. WE discussed the vaginal dryness. She will try other lubricants. Also will talke to MD at [facility] about HRT as an option.  Imagene Riches, CNM  01/27/2021 8:21 AM   Mosetta Pigeon, [facility] 01/27/2021, 8:21 AM

## 2021-01-28 LAB — COMPREHENSIVE METABOLIC PANEL
ALT: 16 IU/L (ref 0–32)
AST: 20 IU/L (ref 0–40)
Albumin/Globulin Ratio: 2.4 — ABNORMAL HIGH (ref 1.2–2.2)
Albumin: 4.8 g/dL (ref 3.8–4.9)
Alkaline Phosphatase: 104 IU/L (ref 44–121)
BUN/Creatinine Ratio: 27 — ABNORMAL HIGH (ref 9–23)
BUN: 17 mg/dL (ref 6–24)
Bilirubin Total: 0.3 mg/dL (ref 0.0–1.2)
CO2: 24 mmol/L (ref 20–29)
Calcium: 10 mg/dL (ref 8.7–10.2)
Chloride: 103 mmol/L (ref 96–106)
Creatinine, Ser: 0.64 mg/dL (ref 0.57–1.00)
Globulin, Total: 2 g/dL (ref 1.5–4.5)
Glucose: 91 mg/dL (ref 70–99)
Potassium: 4.8 mmol/L (ref 3.5–5.2)
Sodium: 142 mmol/L (ref 134–144)
Total Protein: 6.8 g/dL (ref 6.0–8.5)
eGFR: 104 mL/min/{1.73_m2} (ref >59)

## 2021-01-28 LAB — CBC WITH DIFFERENTIAL
Basophils Absolute: 0 10*3/uL (ref 0.0–0.2)
Basos: 1 %
EOS (ABSOLUTE): 0.1 10*3/uL (ref 0.0–0.4)
Eos: 2 %
Hematocrit: 38.9 % (ref 34.0–46.6)
Hemoglobin: 12.7 g/dL (ref 11.1–15.9)
Immature Grans (Abs): 0 10*3/uL (ref 0.0–0.1)
Immature Granulocytes: 0 %
Lymphocytes Absolute: 1.4 10*3/uL (ref 0.7–3.1)
Lymphs: 28 %
MCH: 30.5 pg (ref 26.6–33.0)
MCHC: 32.6 g/dL (ref 31.5–35.7)
MCV: 93 fL (ref 79–97)
Monocytes Absolute: 0.4 10*3/uL (ref 0.1–0.9)
Monocytes: 8 %
Neutrophils Absolute: 3.1 10*3/uL (ref 1.4–7.0)
Neutrophils: 61 %
RBC: 4.17 x10E6/uL (ref 3.77–5.28)
RDW: 12.6 % (ref 11.7–15.4)
WBC: 5.1 10*3/uL (ref 3.4–10.8)

## 2021-01-28 LAB — VITAMIN D 25 HYDROXY (VIT D DEFICIENCY, FRACTURES): Vit D, 25-Hydroxy: 39.5 ng/mL (ref 30.0–100.0)

## 2021-01-28 LAB — LIPID PANEL WITH LDL/HDL RATIO
Cholesterol, Total: 237 mg/dL — ABNORMAL HIGH (ref 100–199)
HDL: 97 mg/dL (ref >39)
LDL Chol Calc (NIH): 124 mg/dL — ABNORMAL HIGH (ref 0–99)
LDL/HDL Ratio: 1.3 ratio (ref 0.0–3.2)
Triglycerides: 94 mg/dL (ref 0–149)
VLDL Cholesterol Cal: 16 mg/dL (ref 5–40)

## 2021-01-29 ENCOUNTER — Encounter: Payer: Self-pay | Admitting: Obstetrics

## 2021-01-30 ENCOUNTER — Encounter: Payer: Self-pay | Admitting: Family Medicine

## 2021-02-04 ENCOUNTER — Ambulatory Visit: Payer: Self-pay

## 2021-02-04 NOTE — Telephone Encounter (Signed)
Chief Complaint: Sinus congestion, body aches, cough, low grade fever 99.9 Symptoms: Blow out green mucus, cough green mucus from sinus drainage down throat, sinus headache, fatigue, no energy Frequency: Started on 02/02/21 Pertinent Negatives: Patient denies SOB Disposition: [] ED /[] Urgent Care (no appt availability in office) / [x] Appointment(In office/virtual)/ []  [facility]/ [] Home Care/ [] Refused Recommended Disposition  Additional Notes: Tested COVID negative yesterday, advised to retest prior to appt.    Summary: cold and flu like symptoms   The patient would like to be contacted by a member of staff regarding cold and flu like symptoms since 02/02/21   The patient shares that they've experienced congestion, cough, body aches and chills   The patient has tested negative for COVID      Reason for Disposition  [1] Fever returns after gone for over 24 hours AND [2] symptoms worse or not improved  Answer Assessment - Initial Assessment Questions 1. ONSET: "When did the nasal discharge start?"      02/02/21 2. AMOUNT: "How much discharge is there?"      Blow out green, runs clear 3. COUGH: "Do you have a cough?" If yes, ask: "Describe the color of your sputum" (clear, white, yellow, green)     Yes, thick green mucus 4. RESPIRATORY DISTRESS: "Describe your breathing."      No 5. FEVER: "Do you have a fever?" If Yes, ask: "What is your temperature, how was it measured, and when did it start?"     99.9 6. SEVERITY: "Overall, how bad are you feeling right now?" (e.g., doesn't interfere with normal activities, staying home from school/work, staying in bed)      No energy, lying around 7. OTHER SYMPTOMS: "Do you have any other symptoms?" (e.g., sore throat, earache, wheezing, vomiting)     Slight headache, throat irritation 8. PREGNANCY: "Is there any chance you are pregnant?" "When was your last menstrual period?"     N/A  Protocols used: Common Cold-A-AH

## 2021-02-05 ENCOUNTER — Telehealth (INDEPENDENT_AMBULATORY_CARE_PROVIDER_SITE_OTHER): Payer: 59 | Admitting: Family Medicine

## 2021-02-05 ENCOUNTER — Encounter: Payer: Self-pay | Admitting: Family Medicine

## 2021-02-05 DIAGNOSIS — U071 COVID-19: Secondary | ICD-10-CM

## 2021-02-05 MED ORDER — MOLNUPIRAVIR EUA 200MG CAPSULE: 4.0000 | ORAL_CAPSULE | Freq: Two times a day (BID) | ORAL | 0 refills | Status: AC

## 2021-02-05 MED ORDER — PROAIR HFA 108 (90 BASE) MCG/ACT IN AERS: 2.0000 | INHALATION_SPRAY | RESPIRATORY_TRACT | 1 refills | Status: DC | PRN

## 2021-02-05 NOTE — Patient Instructions (Addendum)
It was great to see you!  Our plans for today:  - See below for self-isolation guidelines. You may end your quarantine once you are 10 days from symptom onset and fever free for 24 hours without use of tylenol or ibuprofen. Wear a well-fitting N95 mask if you have to be out and about. - Take the molnupiravir as directed. See [email or web address] for more information about the medication. - I recommend getting vaccinated with your booster once you are healed from your current infection. - Certainly, if you are having difficulties breathing or unable to keep down fluids, go to the Emergency Department.   Take care and seek immediate care sooner if you develop any concerns.   Dr. Ky Barban     Person Under Monitoring Name: Janice Donovan  Location: [place] [id number]   Infection Prevention Recommendations for Individuals Confirmed to have, or Being Evaluated for, 2019 Novel Coronavirus (COVID-19) Infection Who Receive Care at Home  Individuals who are confirmed to have, or are being evaluated for, COVID-19 should follow the prevention steps below until a healthcare provider or local or state health department says they can return to normal activities.  Stay home except to get medical care You should restrict activities outside your home, except for getting medical care. Do not go to work, school, or public areas, and do not use public transportation or taxis.  Call ahead before visiting your doctor Before your medical appointment, call the healthcare provider and tell them that you have, or are being evaluated for, COVID-19 infection. This will help the healthcare providers office take steps to keep other people from getting infected. Ask your healthcare provider to call the local or state health department.  Monitor your symptoms Seek prompt medical attention if your illness is worsening (e.g., difficulty breathing). Before going to  your medical appointment, call the healthcare provider and tell them that you have, or are being evaluated for, COVID-19 infection. Ask your healthcare provider to call the local or state health department.  Wear a facemask You should wear a facemask that covers your nose and mouth when you are in the same room with other people and when you visit a healthcare provider. People who live with or visit you should also wear a facemask while they are in the same room with you.  Separate yourself from other people in your home As much as possible, you should stay in a different room from other people in your home. Also, you should use a separate bathroom, if available.  Avoid sharing household items You should not share dishes, drinking glasses, cups, eating utensils, towels, bedding, or other items with other people in your home. After using these items, you should wash them thoroughly with soap and water.  Cover your coughs and sneezes Cover your mouth and nose with a tissue when you cough or sneeze, or you can cough or sneeze into your sleeve. Throw used tissues in a lined trash can, and immediately wash your hands with soap and water for at least 20 seconds or use an alcohol-based hand rub.  Wash your [facility] your hands often and thoroughly with soap and water for at least 20 seconds. You can use an alcohol-based hand sanitizer if soap and water are not available and if your hands are not visibly dirty. Avoid touching your eyes, nose, and mouth with unwashed hands.   Prevention Steps for Caregivers and Household Members of Individuals Confirmed to have, or Being Evaluated for,  COVID-19 Infection Being Cared for in the Home  If you live with, or provide care at home for, a person confirmed to have, or being evaluated for, COVID-19 infection please follow these guidelines to prevent infection:  Follow healthcare providers instructions Make sure that you understand and can help the  patient follow any healthcare provider instructions for all care.  Provide for the patients basic needs You should help the patient with basic needs in the home and provide support for getting groceries, prescriptions, and other personal needs.  Monitor the patients symptoms If they are getting sicker, call his or her medical provider and tell them that the patient has, or is being evaluated for, COVID-19 infection. This will help the healthcare providers office take steps to keep other people from getting infected. Ask the healthcare provider to call the local or state health department.  Limit the number of people who have contact with the patient If possible, have only one caregiver for the patient. Other household members should stay in another home or place of residence. If this is not possible, they should stay in another room, or be separated from the patient as much as possible. Use a separate bathroom, if available. Restrict visitors who do not have an essential need to be in the home.  Keep older adults, very young children, and other sick people away from the patient Keep older adults, very young children, and those who have compromised immune systems or chronic health conditions away from the patient. This includes people with chronic heart, lung, or kidney conditions, diabetes, and cancer.  Ensure good ventilation Make sure that shared spaces in the home have good air flow, such as from an air conditioner or an opened window, weather permitting.  Wash your hands often Wash your hands often and thoroughly with soap and water for at least 20 seconds. You can use an alcohol based hand sanitizer if soap and water are not available and if your hands are not visibly dirty. Avoid touching your eyes, nose, and mouth with unwashed hands. Use disposable paper towels to dry your hands. If not available, use dedicated cloth towels and replace them when they become wet.  Wear a  facemask and gloves Wear a disposable facemask at all times in the room and gloves when you touch or have contact with the patients blood, body fluids, and/or secretions or excretions, such as sweat, saliva, sputum, nasal mucus, vomit, urine, or feces.  Ensure the mask fits over your nose and mouth tightly, and do not touch it during use. Throw out disposable facemasks and gloves after using them. Do not reuse. Wash your hands immediately after removing your facemask and gloves. If your personal clothing becomes contaminated, carefully remove clothing and launder. Wash your hands after handling contaminated clothing. Place all used disposable facemasks, gloves, and other waste in a lined container before disposing them with other household waste. Remove gloves and wash your hands immediately after handling these items.  Do not share dishes, glasses, or other household items with the patient Avoid sharing household items. You should not share dishes, drinking glasses, cups, eating utensils, towels, bedding, or other items with a patient who is confirmed to have, or being evaluated for, COVID-19 infection. After the person uses these items, you should wash them thoroughly with soap and water.  Wash laundry thoroughly Immediately remove and wash clothes or bedding that have blood, body fluids, and/or secretions or excretions, such as sweat, saliva, sputum, nasal mucus, vomit, urine, or  feces, on them. Wear gloves when handling laundry from the patient. Read and follow directions on labels of laundry or clothing items and detergent. In general, wash and dry with the warmest temperatures recommended on the label.  Clean all areas the individual has used often Clean all touchable surfaces, such as counters, tabletops, doorknobs, bathroom fixtures, toilets, phones, keyboards, tablets, and bedside tables, every day. Also, clean any surfaces that may have blood, body fluids, and/or secretions or excretions  on them. Wear gloves when cleaning surfaces the patient has come in contact with. Use a diluted bleach solution (e.g., dilute bleach with 1 part bleach and 10 parts water) or a household disinfectant with a label that says EPA-registered for coronaviruses. To make a bleach solution at home, add 1 tablespoon of bleach to 1 quart (4 cups) of water. For a larger supply, add  cup of bleach to 1 gallon (16 cups) of water. Read labels of cleaning products and follow recommendations provided on product labels. Labels contain instructions for safe and effective use of the cleaning product including precautions you should take when applying the product, such as wearing gloves or eye protection and making sure you have good ventilation during use of the product. Remove gloves and wash hands immediately after cleaning.  Monitor yourself for signs and symptoms of illness Caregivers and household members are considered close contacts, should monitor their health, and will be asked to limit movement outside of the home to the extent possible. Follow the monitoring steps for close contacts listed on the symptom monitoring form.   ? If you have additional questions, contact your local health department or call the epidemiologist on call at [phone] (available 24/7). ? This guidance is subject to change. For the most up-to-date guidance from [facility], please refer to their website: [email or web address]

## 2021-02-05 NOTE — Progress Notes (Signed)
Virtual Visit via Video Note  I connected with Janice Donovan on 02/05/21 at  9:40 AM EST by a video enabled telemedicine application and verified that I am speaking with the correct person using two identifiers.  Location: Patient: home Provider: [facility]   I discussed the limitations of evaluation and management by telemedicine and the availability of in person appointments. The patient expressed understanding and agreed to proceed.  History of Present Illness:  UPPER RESPIRATORY TRACT INFECTION - symptom onset 12/25 - COVID negative 12/26. Positive today.  - body aches, chills  Fever: no, Tmax 100F Cough: yes, productive of phlegm Shortness of breath: no Chest pain: no Chest tightness: no Chest congestion: no Nasal congestion: yes Runny nose: yes Sneezing: yes Sore throat: yes Vomiting: no. Some nausea Context: worse Recurrent sinusitis: no Relief with OTC cold/cough medications:  a little   Treatments attempted: proair, mucinex-D    Observations/Objective:  Tired appearing, in NAD. Congested sounding. Speaks in full sentences, no resp distress. Comfortable WOB on RA.  Assessment and Plan:  POEUM-35 Doing well with mild-mod sx. Is a candidate for COVID treatment, rx molnupiravir. Reviewed OTC symptom relief, self-quarantine guidelines, and emergency precautions.      I discussed the assessment and treatment plan with the patient. The patient was provided an opportunity to ask questions and all were answered. The patient agreed with the plan and demonstrated an understanding of the instructions.   The patient was advised to call back or seek an in-person evaluation if the symptoms worsen or if the condition fails to improve as anticipated.  I provided 8 minutes of non-face-to-face time during this encounter.   Myles Gip, DO

## 2021-02-14 ENCOUNTER — Encounter: Payer: Self-pay | Admitting: Family Medicine

## 2021-03-13 NOTE — Progress Notes (Signed)
[facility] New Patient Evaluation and Consultation  Referring Provider: Imagene Riches, CNM PCP: Gwyneth Sprout, FNP Date of Service: 03/14/2021  SUBJECTIVE Chief Complaint: New Patient (Initial Visit)  History of Present Illness: Janice Donovan is a 56 y.o. White or Caucasian female seen in consultation at the request of CNM Gigi Gin for evaluation of incontinence.    Review of records from [facility] significant for: Has stress incontinence. Worsened since hysterectomy  Urinary Symptoms: Leaks urine with with a full bladder and with urgency Does not leak every day Pad use: none She is bothered by her UI symptoms. Has some uncomfortable pressure in the bladder at the end of urination. This has been going on since her hysterectomy last year.  When she was a teenager she has her urethra dilated a few times due to frequent infection.   Day time voids 6-7.  Nocturia: 1 times per night to void. Voiding dysfunction: she empties her bladder well.  does not use a catheter to empty bladder.  When urinating, she feels she has no difficulties Drinks: 2-3 cups coffee, 8+ glasses water per day, occasional diet sprite or diet lemonade  UTIs:  0  UTI's in the last year.   Denies history of blood in urine and kidney or bladder stones  Pelvic Organ Prolapse Symptoms:                  She Denies a feeling of a bulge the vaginal area.   Bowel Symptom: Bowel movements: 1 time(s) per day Stool consistency: soft  Straining: no.  Splinting: no.  Incomplete evacuation: no.  She Denies accidental bowel leakage / fecal incontinence Bowel regimen: stool softener Last colonoscopy: about 5 years ago, polyps removed.   Sexual Function Sexually active: yes.  Pain with sex: at the vaginal opening, has discomfort due to dryness  Pelvic Pain Denies pelvic pain   Past Medical History:  Past Medical History:  Diagnosis Date   Adrenal nodule (HCC)    Allergic rhinitis     Asthma    Cervical polyp    Constipation    Contraception management 10/16/2014   Generalized abdominal pain    GERD (gastroesophageal reflux disease)    History of mammogram 2015;01/02/15; 01/06/16   birads 1; birads 1; neg   History of Papanicolaou smear of cervix 2011;03/10/15   neg; neg   IBS (irritable bowel syndrome)    Mood disorder ([place])    Obesity    Plantar fasciitis of left foot 09/29/2012   Pneumonia    2010   PONV (postoperative nausea and vomiting)    had nausea one time, hard to wake up many years ago   Renal lesion    Vaginal birth after cesarean 1993     Past Surgical History:   Past Surgical History:  Procedure Laterality Date   ANTERIOR CERVICAL DECOMP/DISCECTOMY FUSION  2005   [place]   colonoscopy with polypectomy  06/2007; 2016   abd pain and chronic coinstipation   CYSTOSCOPY N/A 05/21/2020   Procedure: CYSTOSCOPY;  Surgeon: Will Bonnet, MD;  Location: [facility];  Service: Gynecology;  Laterality: N/A;   DIAGNOSTIC LAPAROSCOPY     normal pelvis   ESOPHAGOGASTRODUODENOSCOPY  06/2007; 2016   gastric polyps   HYSTEROSCOPY WITH D & C N/A 10/13/2018   Procedure: DILATATION AND CURETTAGE /HYSTEROSCOPY;  Surgeon: Gae Dry, MD;  Location: [facility];  Service: Gynecology;  Laterality: N/A;  LEEP  12/06/2008   cx polyp removal-path was HPV changes - [facility]   TOTAL LAPAROSCOPIC HYSTERECTOMY WITH SALPINGECTOMY Bilateral 05/21/2020   Procedure: TOTAL LAPAROSCOPIC HYSTERECTOMY WITH SALPINGECTOMY, LYSIS OF ADHESIONS;  Surgeon: Will Bonnet, MD;  Location: [facility];  Service: Gynecology;  Laterality: Bilateral;     Past OB/GYN History: OB History  Gravida Para Term Preterm AB Living  2 2 2     2   SAB IAB Ectopic Multiple Live Births          2    # Outcome Date GA Lbr Len/2nd Weight Sex Delivery Anes PTL Lv  2 Term 03/29/91   7 lb 3 oz (3.26 kg) M VBAC   LIV  1 Term 02/07/89   6 lb 12 oz (3.062 kg) F CS-LTranv   LIV   S/p  hysterectomy   Medications: She has a current medication list which includes the following prescription(s): bupropion, vitamin d3, [START ON 03/17/2021] estradiol, fexofenadine, fluticasone, montelukast, multivitamin with minerals, pantoprazole, proair hfa, and senna-docusate.   Allergies: Patient is allergic to erythromycin and bee venom.   Social History:  Social History   Tobacco Use   Smoking status: Never   Smokeless tobacco: Never  Vaping Use   Vaping Use: Never used  Substance Use Topics   Alcohol use: No    Alcohol/week: 0.0 standard drinks   Drug use: No    Relationship status: married She lives with husband.   She is employed as a Fish farm manager. Regular exercise: Yes: walking or stationary bike History of abuse: No  Family History:   Family History  Problem Relation Age of Onset   Diabetes Father    Hypertension Father    Benign prostatic hyperplasia Father    Bladder Cancer Father    Hyperlipidemia Father    Dementia Father        Lewy Body Dementia   Asthma Father    Melanoma Father    COPD Mother    Heart disease Mother    Stroke Mother    Hypertension Mother    Diabetes Sister    Hyperlipidemia Brother    Hypertension Brother    Cancer Maternal Grandmother 81       pancreatic   Prostate cancer Neg Hx    Kidney disease Neg Hx    Breast cancer Neg Hx      Review of Systems: ROS   OBJECTIVE Physical Exam: Vitals:   03/14/21 0901  BP: 118/77  Pulse: 75  Weight: 154 lb (69.9 kg)  Height: 5\' 5"  (1.651 m)    Physical Exam   GU / Detailed Urogynecologic Evaluation:  Pelvic Exam: Normal external female genitalia; Bartholin's and Skene's glands normal in appearance; urethral meatus normal in appearance, no urethral masses or discharge.   CST: negative  s/p hysterectomy: Speculum exam reveals normal vaginal mucosa with  atrophy and normal vaginal cuff.  Adnexa no mass, fullness, tenderness.    Pelvic floor strength I/V  Pelvic floor  musculature: Right levator non-tender, Right obturator non-tender, Left levator non-tender, Left obturator non-tender  POP-Q:   POP-Q  -3                                            Aa   -3  Ba  -8                                              C   3.5                                            Gh  4                                            Pb  9                                            tvl   -2                                            Ap  -2                                            Bp                                                 D     Rectal Exam:  Normal external rectum  Post-Void Residual (PVR) by Bladder Scan: In order to evaluate bladder emptying, we discussed obtaining a postvoid residual and she agreed to this procedure.  Procedure: The ultrasound unit was placed on the patient's abdomen in the suprapubic region after the patient had voided. A PVR of 78 ml was obtained by bladder scan.  Laboratory Results: POC urine: negative   ASSESSMENT AND PLAN Ms. Vanderbeck is a 56 y.o. with:  1. Urge incontinence   2. Urinary frequency   3. Vaginal atrophy    Urge incontinence - We discussed the symptoms of overactive bladder (OAB), which include urinary urgency, urinary frequency, nocturia, with or without urge incontinence.  While we do not know the exact etiology of OAB, several treatment options exist. We discussed management including behavioral therapy (decreasing bladder irritants, urge suppression strategies, timed voids, bladder retraining), physical therapy, medication.  - She will work on decreasing coffee intake. Handout provided on bladder irritants. Also discussed using tums or prelief to reduce urine acidity.  - Declined physical therapy at this time- prefers to work on exercises by herself. AUGS handout provided on exercises/ bladder control.   2. Vaginal atrophy/ dyspareunia - prescribed estrace cream  0.5g nightly for two weeks then twice weekly after - She can also consider using alternative lubricant during intercourse such as coconut oil or silicone based lubricant.   Return if symptoms fail to improve  Jaquita Folds, MD

## 2021-03-14 ENCOUNTER — Encounter: Payer: Self-pay | Admitting: Obstetrics and Gynecology

## 2021-03-14 ENCOUNTER — Ambulatory Visit (INDEPENDENT_AMBULATORY_CARE_PROVIDER_SITE_OTHER): Payer: 59 | Admitting: Obstetrics and Gynecology

## 2021-03-14 ENCOUNTER — Other Ambulatory Visit: Payer: Self-pay

## 2021-03-14 VITALS — BP 118/77 | HR 75 | Ht 65.0 in | Wt 154.0 lb

## 2021-03-14 DIAGNOSIS — N952 Postmenopausal atrophic vaginitis: Secondary | ICD-10-CM

## 2021-03-14 DIAGNOSIS — R350 Frequency of micturition: Secondary | ICD-10-CM

## 2021-03-14 DIAGNOSIS — N3941 Urge incontinence: Secondary | ICD-10-CM | POA: Diagnosis not present

## 2021-03-14 LAB — POCT URINALYSIS DIPSTICK
Appearance: NORMAL
Bilirubin, UA: NEGATIVE
Blood, UA: NEGATIVE
Glucose, UA: NEGATIVE
Ketones, UA: NEGATIVE
Leukocytes, UA: NEGATIVE
Nitrite, UA: NEGATIVE
Protein, UA: NEGATIVE
Spec Grav, UA: 1.01 (ref 1.010–1.025)
Urobilinogen, UA: 0.2 E.U./dL
pH, UA: 7 (ref 5.0–8.0)

## 2021-03-14 MED ORDER — ESTRADIOL 0.1 MG/GM VA CREA: 0.5000 g | TOPICAL_CREAM | VAGINAL | 11 refills | Status: DC

## 2021-03-14 NOTE — Patient Instructions (Addendum)
Today we talked about ways to manage bladder urgency such as altering your diet to avoid irritative beverages and foods (bladder diet) as well as attempting to decrease stress and other exacerbating factors.  You can also chew a plain Tums 1-3 times per day or take Prelief to make your urine less acidic, especially if you have eating/drinking acidic things.    The Most Bothersome Foods* The Least Bothersome Foods*  Coffee - Regular & Decaf Tea - caffeinated Carbonated beverages - cola, non-colas, diet & caffeine-free Alcohols - Beer, Red Wine, White Wine, Champagne Fruits - Grapefruit, [place], Orange, [employer] - Cranberry, Grapefruit, Orange, Pineapple Vegetables - Tomato & Tomato Products Flavor Enhancers - Hot peppers, Spicy foods, Chili, Horseradish, Vinegar, Monosodium glutamate (MSG) Artificial Sweeteners - NutraSweet, Sweet 'N Low, Equal (sweetener), Saccharin Ethnic foods - [place], [place], [place] food Express Scripts - low-fat & whole Fruits - Bananas, Blueberries, Honeydew melon, Pears, Raisins, Watermelon Vegetables - Broccoli, Brussels Sprouts, [place], Carrots, Cauliflower, [place], Cucumber, Mushrooms, Peas, Radishes, Squash, Zucchini, White potatoes, Sweet potatoes & yams Poultry - Chicken, Eggs, [place], [employer] - Beef, Programmer, multimedia, Lamb Seafood - Shrimp, Bly fish, Salmon Grains - Oat, Rice Snacks - Pretzels, Popcorn  *Lissa Morales et al. Diet and its role in interstitial cystitis/bladder pain syndrome (IC/BPS) and comorbid conditions. [place] 2012 Jan 11.    Start vaginal estrogen therapy nightly for two weeks then 2 times weekly at night for treatment of vaginal atrophy (dryness of the vaginal tissues).  Please let us know if the prescription is too expensive and we can look for alternative options.   Vulvovaginal moisturizer Options: Vitamin E oil (pump or capsule) or cream (Gene's Vit E Cream) Coconut oil Silicone-based lubricant for use during  intercourse (" uber lube" on amazone or "wet platinum" is a brand available at most drugstores) Crisco Consider the ingredients of the product - the fewer the ingredients the better!  Directions for Use: Clean and dry your hands Gently dab the vulvar/vaginal area dry as needed Apply a pea-sized amount of the moisturizer onto your fingertip Using you other hand, open the labia  Apply the moisturizer to the vulvar/vaginal tissues Wear loose fitting underwear/clothing if possible following application Use moisturize up to 3 times daily as desired.

## 2021-06-25 ENCOUNTER — Ambulatory Visit: Payer: 59 | Admitting: Obstetrics and Gynecology

## 2021-06-25 ENCOUNTER — Encounter: Payer: Self-pay | Admitting: Obstetrics and Gynecology

## 2021-06-25 VITALS — BP 120/80 | Ht 66.0 in | Wt 153.0 lb

## 2021-06-25 DIAGNOSIS — Z01419 Encounter for gynecological examination (general) (routine) without abnormal findings: Secondary | ICD-10-CM

## 2021-06-25 NOTE — Progress Notes (Signed)
56 yo P2 s/p hysterectomy here for pelvic check. Patient reports feeling well without discomfort. She started using coconut oil as a vaginal lubricant with improvement in her vaginal dryness. Patient reports improvement in her urinary incontinence with recommended exercises. Patient is without any complaints ? ?Past Medical History:  ?Diagnosis Date  ? Adrenal nodule ([place])   ? Allergic rhinitis   ? Asthma   ? Cervical polyp   ? Constipation   ? Contraception management 10/16/2014  ? Generalized abdominal pain   ? GERD (gastroesophageal reflux disease)   ? History of mammogram 2015;01/02/15; 01/06/16  ? birads 1; birads 1; neg  ? History of Papanicolaou smear of cervix 2011;03/10/15  ? neg; neg  ? IBS (irritable bowel syndrome)   ? Mood disorder ([place])   ? Obesity   ? Plantar fasciitis of left foot 09/29/2012  ? Pneumonia   ? 2010  ? PONV (postoperative nausea and vomiting)   ? had nausea one time, hard to wake up many years ago  ? Renal lesion   ? Vaginal birth after cesarean 1993  ? ?Past Surgical History:  ?Procedure Laterality Date  ? ANTERIOR CERVICAL DECOMP/DISCECTOMY FUSION  2005  ? C4-6  ? CESAREAN SECTION  1990  ? colonoscopy with polypectomy  06/2007; 2016  ? abd pain and chronic coinstipation  ? CYSTOSCOPY N/A 05/21/2020  ? Procedure: CYSTOSCOPY;  Surgeon: Will Bonnet, MD;  Location: [facility];  Service: Gynecology;  Laterality: N/A;  ? DIAGNOSTIC LAPAROSCOPY    ? normal pelvis  ? ESOPHAGOGASTRODUODENOSCOPY  06/2007; 2016  ? gastric polyps  ? HYSTEROSCOPY WITH D & C N/A 10/13/2018  ? Procedure: DILATATION AND CURETTAGE /HYSTEROSCOPY;  Surgeon: Gae Dry, MD;  Location: [facility];  Service: Gynecology;  Laterality: N/A;  ? LEEP  12/06/2008  ? cx polyp removal-path was HPV changes - [facility]  ? TOTAL LAPAROSCOPIC HYSTERECTOMY WITH SALPINGECTOMY Bilateral 05/21/2020  ? Procedure: TOTAL LAPAROSCOPIC HYSTERECTOMY WITH SALPINGECTOMY, LYSIS OF ADHESIONS;  Surgeon: Will Bonnet, MD;  Location: [facility];   Service: Gynecology;  Laterality: Bilateral;  ? ?Family History  ?Problem Relation Age of Onset  ? Diabetes Father   ? Hypertension Father   ? Benign prostatic hyperplasia Father   ? Bladder Cancer Father   ? Hyperlipidemia Father   ? Dementia Father   ?     Lewy Body Dementia  ? Asthma Father   ? Melanoma Father   ? COPD Mother   ? Heart disease Mother   ? Stroke Mother   ? Hypertension Mother   ? Diabetes Sister   ? Hyperlipidemia Brother   ? Hypertension Brother   ? Cancer Maternal Grandmother 63  ?     pancreatic  ? Prostate cancer Neg Hx   ? Kidney disease Neg Hx   ? Breast cancer Neg Hx   ? ?Social History  ? ?Tobacco Use  ? Smoking status: Never  ? Smokeless tobacco: Never  ?Vaping Use  ? Vaping Use: Never used  ?Substance Use Topics  ? Alcohol use: No  ?  Alcohol/week: 0.0 standard drinks  ? Drug use: No  ? ?ROS ?See pertinent in HPI. All other systems reviewed and non contributory ?Blood pressure 120/80, height '5\' 6"'$  (1.676 m), weight 153 lb (69.4 kg), last menstrual period 01/14/2020. ?GENERAL: Well-developed, well-nourished female in no acute distress.  ?ABDOMEN: Soft, nontender, nondistended. No organomegaly. ?PELVIC: Normal external female genitalia. Vagina is pink and rugated.  Normal discharge. Vaginal vault intact. No adnexal  mass or tenderness. Chaperone present during the pelvic exam ?EXTREMITIES: No cyanosis, clubbing, or edema, 2+ distal pulses. ? ?A/P 56 yo with normal pelvic exam ?- Encouraged the use of coconut oil and estrogen if needed ?- patient is current on mammogram and colonoscopy ?- RTC prn ?

## 2021-08-27 LAB — LIPID PANEL
Cholesterol: 247 — AB (ref 0–200)
HDL: 94 — AB (ref 35–70)
LDL Cholesterol: 134
LDl/HDL Ratio: 19
Triglycerides: 110 (ref 40–160)

## 2021-08-27 LAB — COMPREHENSIVE METABOLIC PANEL: eGFR: 107

## 2021-08-27 LAB — HEMOGLOBIN A1C: Hemoglobin A1C: 5.4

## 2021-08-27 LAB — BASIC METABOLIC PANEL
Creatinine: 0.6 (ref <=1.1)
Glucose: 96

## 2021-09-10 ENCOUNTER — Encounter: Payer: Self-pay | Admitting: Family Medicine

## 2021-09-10 ENCOUNTER — Ambulatory Visit: Payer: 59 | Admitting: Family Medicine

## 2021-09-10 VITALS — BP 120/73 | HR 81 | Temp 98.1°F | Ht 65.98 in | Wt 154.1 lb

## 2021-09-10 DIAGNOSIS — J4521 Mild intermittent asthma with (acute) exacerbation: Secondary | ICD-10-CM

## 2021-09-10 DIAGNOSIS — E7800 Pure hypercholesterolemia, unspecified: Secondary | ICD-10-CM | POA: Diagnosis not present

## 2021-09-10 MED ORDER — PREDNISONE 10 MG PO TABS: ORAL_TABLET | ORAL | 0 refills | Status: DC

## 2021-09-10 MED ORDER — PROAIR HFA 108 (90 BASE) MCG/ACT IN AERS: 2.0000 | INHALATION_SPRAY | RESPIRATORY_TRACT | 1 refills | Status: DC | PRN

## 2021-09-10 MED ORDER — AMOXICILLIN-POT CLAVULANATE 875-125 MG PO TABS: 1.0000 | ORAL_TABLET | Freq: Two times a day (BID) | ORAL | 0 refills | Status: DC

## 2021-09-10 NOTE — Assessment & Plan Note (Signed)
Chronic, increased by 10 pts in last 6 months Review of ASCVD risk recommend diet low in saturated fat and regular exercise - 30 min at least 5 times per week

## 2021-09-10 NOTE — Progress Notes (Signed)
Established patient visit   Patient: Janice Donovan   DOB: [birth year]   56 y.o. Female  MRN: [id number] Visit Date: 09/10/2021  Today's healthcare provider: Gwyneth Sprout, FNP  Introduced to nurse practitioner role and practice setting.  All questions answered.  Discussed provider/patient relationship and expectations.   Chief Complaint  Patient presents with   Hyperlipidemia   Sore Throat    Started this morning, more scratchy than sore. Has started taking Mucinex D this morning.    Subjective    Follow up for Cholesterol  The patient was last seen on 01-30-22/labs.  Changes made at last visit include: recommend diet low in saturated fat and regular exercise - 30 min at least 5 times per week.  She reports good compliance with treatment. She feels that condition is Unchanged. She is not having side effects.   ----------------------------------------------------------------------------------------   Medications: Outpatient Medications Prior to Visit  Medication Sig   B-COMPLEX-C PO Take by mouth.   buPROPion (WELLBUTRIN XL) 150 MG 24 hr tablet TAKE 1 TABLET BY MOUTH  DAILY   Cholecalciferol (VITAMIN D3) 250 MCG (10000 UT) TABS Take 10,000 Units by mouth at bedtime.   fexofenadine (ALLEGRA) 180 MG tablet Take 1 tablet (180 mg total) by mouth every evening.   fluticasone (FLONASE) 50 MCG/ACT nasal spray Place 2 sprays into both nostrils daily as needed for allergies.   montelukast (SINGULAIR) 10 MG tablet Take 1 tablet (10 mg total) by mouth at bedtime.   Multiple Vitamin (MULTIVITAMIN WITH MINERALS) TABS tablet Take 1 tablet by mouth at bedtime.   pantoprazole (PROTONIX) 40 MG tablet Take 1 tablet (40 mg total) by mouth 2 (two) times daily.   senna-docusate (SENOKOT-S) 8.6-50 MG per tablet Take 2 tablets by mouth 2 (two) times daily.    [DISCONTINUED] PROAIR HFA 108 (90 Base) MCG/ACT inhaler Inhale 2 puffs into the lungs every 4 (four) hours as needed (asthma).    estradiol (ESTRACE) 0.1 MG/GM vaginal cream Place 0.5 g vaginally 2 (two) times a week. Place 0.5g nightly for two weeks then twice a week after (Patient not taking: Reported on 09/10/2021)   No facility-administered medications prior to visit.    Review of Systems  Last CBC Lab Results  Component Value Date   WBC 5.1 01/27/2021   HGB 12.7 01/27/2021   HCT 38.9 01/27/2021   MCV 93 01/27/2021   MCH 30.5 01/27/2021   RDW 12.6 01/27/2021   PLT 197 83/10/4074   Last metabolic panel Lab Results  Component Value Date   GLUCOSE 91 01/27/2021   NA 142 01/27/2021   K 4.8 01/27/2021   CL 103 01/27/2021   CO2 24 01/27/2021   BUN 17 01/27/2021   CREATININE 0.6 08/27/2021   EGFR 107 08/27/2021   CALCIUM 10.0 01/27/2021   PROT 6.8 01/27/2021   ALBUMIN 4.8 01/27/2021   LABGLOB 2.0 01/27/2021   AGRATIO 2.4 (H) 01/27/2021   BILITOT 0.3 01/27/2021   ALKPHOS 104 01/27/2021   AST 20 01/27/2021   ALT 16 01/27/2021   ANIONGAP 7 05/20/2020   Last lipids Lab Results  Component Value Date   CHOL 247 (A) 08/27/2021   HDL 94 (A) 08/27/2021   LDLCALC 134 08/27/2021   TRIG 110 08/27/2021   CHOLHDL 2.7 11/02/2018   Last hemoglobin A1c Lab Results  Component Value Date   HGBA1C 5.4 08/27/2021   Last thyroid functions Lab Results  Component Value Date   TSH 1.150 11/02/2018  Last vitamin D Lab Results  Component Value Date   VD25OH 39.5 01/27/2021   Last vitamin B12 and Folate Lab Results  Component Value Date   VITAMINB12 527 06/03/2015   FOLATE >20.0 06/03/2015       Objective    BP 120/73   Pulse 81   Temp 98.1 F (36.7 C) (Oral)   Ht 5' 5.98" (1.676 m)   Wt 154 lb 1.6 oz (69.9 kg)   LMP 01/14/2020 (Exact Date)   SpO2 100%   BMI 24.88 kg/m  BP Readings from Last 3 Encounters:  09/10/21 120/73  06/25/21 120/80  03/14/21 118/77   Wt Readings from Last 3 Encounters:  09/10/21 154 lb 1.6 oz (69.9 kg)  06/25/21 153 lb (69.4 kg)  03/14/21 154 lb (69.9 kg)    SpO2 Readings from Last 3 Encounters:  09/10/21 100%  01/09/21 100%  05/21/20 98%      Physical Exam Vitals and nursing note reviewed.  Constitutional:      General: She is not in acute distress.    Appearance: Normal appearance. She is well-developed and normal weight. She is not ill-appearing, toxic-appearing or diaphoretic.  HENT:     Head: Normocephalic and atraumatic.     Right Ear: Tenderness present.     Left Ear: Tenderness present.     Mouth/Throat:     Mouth: Mucous membranes are moist.     Pharynx: Posterior oropharyngeal erythema present.     Tonsils: 0 on the right. 0 on the left.  Eyes:     Conjunctiva/sclera: Conjunctivae normal.  Cardiovascular:     Rate and Rhythm: Normal rate and regular rhythm.     Pulses: Normal pulses.     Heart sounds: Normal heart sounds. No murmur heard.    No friction rub. No gallop.  Pulmonary:     Effort: Pulmonary effort is normal. No respiratory distress.     Breath sounds: Normal breath sounds. No stridor. No wheezing, rhonchi or rales.  Chest:     Chest wall: No tenderness.  Abdominal:     General: Bowel sounds are normal.     Palpations: Abdomen is soft.  Musculoskeletal:        General: No swelling, tenderness, deformity or signs of injury. Normal range of motion.     Right lower leg: No edema.     Left lower leg: No edema.  Skin:    General: Skin is warm and dry.     Capillary Refill: Capillary refill takes less than 2 seconds.     Coloration: Skin is not jaundiced or pale.     Findings: No bruising, erythema, lesion or rash.  Neurological:     General: No focal deficit present.     Mental Status: She is alert and oriented to person, place, and time. Mental status is at baseline.     Cranial Nerves: No cranial nerve deficit.     Sensory: No sensory deficit.     Motor: No weakness.     Coordination: Coordination normal.  Psychiatric:        Mood and Affect: Mood normal.        Behavior: Behavior normal.         Thought Content: Thought content normal.        Judgment: Judgment normal.     Results for orders placed or performed in visit on 29/92/42  Basic metabolic panel  Result Value Ref Range   Glucose 96    Creatinine 0.6 0.5 -  1.1  Comprehensive metabolic panel  Result Value Ref Range   eGFR 107   Lipid panel  Result Value Ref Range   LDl/HDL Ratio 19.0    Triglycerides 110 40 - 160   Cholesterol 247 (A) 0 - 200   HDL 94 (A) 35 - 70   LDL Cholesterol 134   Hemoglobin A1c  Result Value Ref Range   Hemoglobin A1C 5.4     Assessment & Plan     Problem List Items Addressed This Visit       Respiratory   Airway hyperreactivity - Primary    Chronic, with recent exacerbation Request for Abx, Steroid, refill of inhaler Denies sick contacts Plan for travel in 10 days  Continue to drink water daily.  Goal of 64 oz/day minimum.  Can use OTC allergy medication, ex Zyrtec as well as nasal steroid, ex Flonase.  If you are congested you can use Saline Nasal Spray.  Use saline prior to using Flonase if needed.  You can use medications for cough like Delsym and for mucus, like Mucinex.  Can add lozenges or drink tea with local honey to help relieve symptoms.       Relevant Medications   PROAIR HFA 108 (90 Base) MCG/ACT inhaler   amoxicillin-clavulanate (AUGMENTIN) 875-125 MG tablet   predniSONE (DELTASONE) 10 MG tablet     Other   Elevated LDL cholesterol level    Chronic, increased by 10 pts in last 6 months Review of ASCVD risk recommend diet low in saturated fat and regular exercise - 30 min at least 5 times per week         Return in about 6 months (around 03/13/2022) for annual examination.      Vonna Kotyk, FNP, have reviewed all documentation for this visit. The documentation on 09/10/21 for the exam, diagnosis, procedures, and orders are all accurate and complete.    Gwyneth Sprout, [place] [phone] (phone) [phone]  (fax)  [place]

## 2021-09-10 NOTE — Assessment & Plan Note (Signed)
Chronic, with recent exacerbation Request for Abx, Steroid, refill of inhaler Denies sick contacts Plan for travel in 10 days  Continue to drink water daily.  Goal of 64 oz/day minimum.  Can use OTC allergy medication, ex Zyrtec as well as nasal steroid, ex Flonase.  If you are congested you can use Saline Nasal Spray.  Use saline prior to using Flonase if needed.  You can use medications for cough like Delsym and for mucus, like Mucinex.  Can add lozenges or drink tea with local honey to help relieve symptoms.

## 2021-09-10 NOTE — Patient Instructions (Signed)
The 10-year ASCVD risk score (Arnett DK, et al., 2019) is: 1.3%*   Values used to calculate the score:     Age: 56 years     Sex: Female     Is Non-Hispanic African American: No     Diabetic: No     Tobacco smoker: No     Systolic Blood Pressure: 295 mmHg     Is BP treated: No     HDL Cholesterol: 94 mg/dL*     Total Cholesterol: 247 mg/dL*     * - Cholesterol units were assumed for this score calculation

## 2021-09-11 ENCOUNTER — Other Ambulatory Visit: Payer: Self-pay | Admitting: Family Medicine

## 2021-09-11 DIAGNOSIS — J4521 Mild intermittent asthma with (acute) exacerbation: Secondary | ICD-10-CM

## 2021-09-11 MED ORDER — ALBUTEROL SULFATE HFA 108 (90 BASE) MCG/ACT IN AERS: 2.0000 | INHALATION_SPRAY | Freq: Four times a day (QID) | RESPIRATORY_TRACT | 2 refills | Status: DC | PRN

## 2021-11-18 ENCOUNTER — Other Ambulatory Visit: Payer: Self-pay | Admitting: Family Medicine

## 2021-11-19 NOTE — Telephone Encounter (Signed)
Requested Prescriptions  Pending Prescriptions Disp Refills  . pantoprazole (PROTONIX) 40 MG tablet [Pharmacy Med Name: Pantoprazole Sodium 40 MG Oral Tablet Delayed Release] 180 tablet 0    Sig: TAKE 1 TABLET BY MOUTH TWICE  DAILY     Gastroenterology: Proton Pump Inhibitors Passed - 11/18/2021 10:12 PM      Passed - Valid encounter within last 12 months    Recent Outpatient Visits          2 months ago Mild intermittent asthma with acute exacerbation   [facility] Gwyneth Sprout, FNP   9 months ago [place], Alison M, DO   10 months ago [place], Dionne Bucy, MD   1 year ago Depression, major, single episode, complete remission [facility])   [place], [place], PA-C   1 year ago Depression, major, single episode, complete remission [facility])   [place], Clearnce Sorrel, [place]      Future Appointments            In 1 month Rollene Rotunda, Jaci Standard, [place], [place]

## 2021-12-01 ENCOUNTER — Encounter: Payer: Self-pay | Admitting: *Deleted

## 2022-01-01 ENCOUNTER — Other Ambulatory Visit: Payer: Self-pay | Admitting: Family Medicine

## 2022-01-02 NOTE — Telephone Encounter (Signed)
Requested Prescriptions  Pending Prescriptions Disp Refills   buPROPion (WELLBUTRIN XL) 150 MG 24 hr tablet [Pharmacy Med Name: buPROPion HCl ER (XL) 150 MG Oral Tablet Extended Release 24 Hour] 90 tablet 0    Sig: TAKE 1 TABLET BY MOUTH DAILY     Psychiatry: Antidepressants - bupropion Passed - 01/01/2022 10:29 PM      Passed - Cr in normal range and within 360 days    Creatinine  Date Value Ref Range Status  08/27/2021 0.6 0.5 - 1.1 Final   Creatinine, Ser  Date Value Ref Range Status  01/27/2021 0.64 0.57 - 1.00 mg/dL Final         Passed - AST in normal range and within 360 days    AST  Date Value Ref Range Status  01/27/2021 20 0 - 40 IU/L Final         Passed - ALT in normal range and within 360 days    ALT  Date Value Ref Range Status  01/27/2021 16 0 - 32 IU/L Final         Passed - Last BP in normal range    BP Readings from Last 1 Encounters:  09/10/21 120/73         Passed - Valid encounter within last 6 months    Recent Outpatient Visits           3 months ago Mild intermittent asthma with acute exacerbation   [facility] Gwyneth Sprout, FNP   11 months ago [place], Alison M, DO   11 months ago [place], Dionne Bucy, MD   1 year ago Depression, major, single episode, complete remission [facility])   [place], [place]   1 year ago Depression, major, single episode, complete remission [facility])   [place], Clearnce Sorrel, [place]       Future Appointments             In 1 week Gwyneth Sprout, [place], [place]

## 2022-01-08 ENCOUNTER — Telehealth: Payer: Self-pay

## 2022-01-08 NOTE — Telephone Encounter (Signed)
Patient called wanting referral for mammogram

## 2022-01-12 ENCOUNTER — Other Ambulatory Visit: Payer: Self-pay | Admitting: Family Medicine

## 2022-01-12 DIAGNOSIS — Z1231 Encounter for screening mammogram for malignant neoplasm of breast: Secondary | ICD-10-CM

## 2022-01-13 ENCOUNTER — Encounter: Payer: 59 | Admitting: Family Medicine

## 2022-01-16 ENCOUNTER — Other Ambulatory Visit: Payer: Self-pay | Admitting: Family Medicine

## 2022-01-21 ENCOUNTER — Other Ambulatory Visit: Payer: Self-pay | Admitting: Family Medicine

## 2022-01-21 ENCOUNTER — Encounter: Payer: Self-pay | Admitting: Physician Assistant

## 2022-01-21 ENCOUNTER — Encounter: Payer: Self-pay | Admitting: Family Medicine

## 2022-01-21 DIAGNOSIS — Z1231 Encounter for screening mammogram for malignant neoplasm of breast: Secondary | ICD-10-CM

## 2022-01-25 ENCOUNTER — Other Ambulatory Visit: Payer: Self-pay | Admitting: Family Medicine

## 2022-01-26 ENCOUNTER — Ambulatory Visit
Admission: RE | Admit: 2022-01-26 | Discharge: 2022-01-26 | Disposition: A | Discharge status: Home / Self Care | Payer: 59 | Source: Ambulatory Visit | Attending: Family Medicine | Admitting: Family Medicine

## 2022-01-26 DIAGNOSIS — Z1231 Encounter for screening mammogram for malignant neoplasm of breast: Secondary | ICD-10-CM | POA: Insufficient documentation

## 2022-01-28 NOTE — Progress Notes (Signed)
Hi Lucila  Normal mammogram; repeat in 1 year.  Please let us know if you have any questions.  Thank you,  Tally Joe, FNP

## 2022-02-04 ENCOUNTER — Ambulatory Visit (INDEPENDENT_AMBULATORY_CARE_PROVIDER_SITE_OTHER): Payer: 59 | Admitting: Family Medicine

## 2022-02-04 ENCOUNTER — Encounter: Payer: Self-pay | Admitting: Family Medicine

## 2022-02-04 VITALS — BP 117/89 | HR 69 | Temp 98.0°F | Ht 66.0 in | Wt 162.2 lb

## 2022-02-04 DIAGNOSIS — E7800 Pure hypercholesterolemia, unspecified: Secondary | ICD-10-CM | POA: Diagnosis not present

## 2022-02-04 DIAGNOSIS — F339 Major depressive disorder, recurrent, unspecified: Secondary | ICD-10-CM | POA: Diagnosis not present

## 2022-02-04 DIAGNOSIS — Z114 Encounter for screening for human immunodeficiency virus [HIV]: Secondary | ICD-10-CM | POA: Insufficient documentation

## 2022-02-04 DIAGNOSIS — Z1159 Encounter for screening for other viral diseases: Secondary | ICD-10-CM

## 2022-02-04 DIAGNOSIS — Z0000 Encounter for general adult medical examination without abnormal findings: Secondary | ICD-10-CM | POA: Diagnosis not present

## 2022-02-04 DIAGNOSIS — Z23 Encounter for immunization: Secondary | ICD-10-CM | POA: Diagnosis not present

## 2022-02-04 DIAGNOSIS — E559 Vitamin D deficiency, unspecified: Secondary | ICD-10-CM

## 2022-02-04 DIAGNOSIS — J4520 Mild intermittent asthma, uncomplicated: Secondary | ICD-10-CM

## 2022-02-04 NOTE — Patient Instructions (Signed)
The CDC recommends two doses of Shingrix (the new shingles vaccine) separated by 2 to 6 months for adults age 56 years and older. I recommend checking with your insurance plan regarding coverage for this vaccine.    

## 2022-02-04 NOTE — Progress Notes (Signed)
I,Connie R Striblin,acting as a Education administrator for Gwyneth Sprout, FNP.,have documented all relevant documentation on the behalf of Gwyneth Sprout, FNP,as directed by  Gwyneth Sprout, FNP while in the presence of Gwyneth Sprout, FNP.  Complete physical exam  Patient: Janice Donovan   DOB: 09/06/1965   56 y.o. Female  MRN: [id number] Visit Date: 02/04/2022  Today's healthcare provider: Gwyneth Sprout, FNP  Re Introduced to nurse practitioner role and practice setting.  All questions answered.  Discussed provider/patient relationship and expectations.  Chief Complaint  Patient presents with   Annual Exam   Subjective    Janice Donovan is a 56 y.o. female who presents today for a complete physical exam.  She reports consuming a general diet. The patient does not participate in regular exercise at present. She generally feels well. She reports sleeping fairly well. She does not have additional problems to discuss today.   HPI   Past Medical History:  Diagnosis Date   Adrenal nodule ([place])    Allergic rhinitis    Asthma    Cervical polyp    Constipation    Contraception management 10/16/2014   Generalized abdominal pain    GERD (gastroesophageal reflux disease)    History of mammogram 2015;01/02/15; 01/06/16   birads 1; birads 1; neg   History of Papanicolaou smear of cervix 2011;03/10/15   neg; neg   IBS (irritable bowel syndrome)    Mood disorder ([place])    Obesity    Plantar fasciitis of left foot 09/29/2012   Pneumonia    2010   PONV (postoperative nausea and vomiting)    had nausea one time, hard to wake up many years ago   Renal lesion    Vaginal birth after cesarean 1993   Past Surgical History:  Procedure Laterality Date   ANTERIOR CERVICAL DECOMP/DISCECTOMY FUSION  2005   [place]   colonoscopy with polypectomy  06/2007; 2016   abd pain and chronic coinstipation   CYSTOSCOPY N/A 05/21/2020   Procedure: CYSTOSCOPY;  Surgeon: Will Bonnet, MD;   Location: [facility];  Service: Gynecology;  Laterality: N/A;   DIAGNOSTIC LAPAROSCOPY     normal pelvis   ESOPHAGOGASTRODUODENOSCOPY  06/2007; 2016   gastric polyps   HYSTEROSCOPY WITH D & C N/A 10/13/2018   Procedure: DILATATION AND CURETTAGE /HYSTEROSCOPY;  Surgeon: Gae Dry, MD;  Location: [facility];  Service: Gynecology;  Laterality: N/A;   LEEP  12/06/2008   cx polyp removal-path was HPV changes - [facility]   TOTAL LAPAROSCOPIC HYSTERECTOMY WITH SALPINGECTOMY Bilateral 05/21/2020   Procedure: TOTAL LAPAROSCOPIC HYSTERECTOMY WITH SALPINGECTOMY, LYSIS OF ADHESIONS;  Surgeon: Will Bonnet, MD;  Location: [facility];  Service: Gynecology;  Laterality: Bilateral;   Social History   Socioeconomic History   Marital status: Married    Spouse name: Not on file   Number of children: 2   Years of education: H/S   Highest education level: Not on file  Occupational History   Occupation: Full-time    Employer: [employer]  Tobacco Use   Smoking status: Never   Smokeless tobacco: Never  Vaping Use   Vaping Use: Never used  Substance and Sexual Activity   Alcohol use: No    Alcohol/week: 0.0 standard drinks of alcohol   Drug use: No   Sexual activity: Yes    Partners: Male    Birth control/protection: None, Surgical    Comment: ablation  Other Topics Concern  Not on file  Social History Narrative   Terria's daughter Apolonio Schneiders has 3 children -ages 76,2,and 50(2018). Her son is a Education officer, museum.   Social Determinants of Health   Financial Resource Strain: Not on file  Food Insecurity: Not on file  Transportation Needs: Not on file  Physical Activity: Inactive (01/11/2017)   Exercise Vital Sign    Days of Exercise per Week: 0 days    Minutes of Exercise per Session: 0 min  Stress: No Stress Concern Present (01/11/2017)   [place]    Feeling of Stress : Not at all  Social Connections: Moderately Integrated  (01/11/2017)   Social Connection and Isolation Panel [NHANES]    Frequency of Communication with Friends and Family: More than three times a week    Frequency of Social Gatherings with Friends and Family: Three times a week    Attends Religious Services: More than 4 times per year    Active Member of Clubs or Organizations: No    Attends Archivist Meetings: Never    Marital Status: Married  Human resources officer Violence: Not At Risk (01/11/2017)   Humiliation, Afraid, Rape, and Kick questionnaire    Fear of Current or Ex-Partner: No    Emotionally Abused: No    Physically Abused: No    Sexually Abused: No   Family Status  Relation Name Status   Father  Deceased at age 46   Mother  Alive   Sister  [place]  Deceased   MGF  Deceased   [place]  Deceased   PGF  Deceased   Neg Hx  (Not Specified)   Family History  Problem Relation Age of Onset   Diabetes Father    Hypertension Father    Benign prostatic hyperplasia Father    Bladder Cancer Father    Hyperlipidemia Father    Dementia Father        Lewy Body Dementia   Asthma Father    Melanoma Father    COPD Mother    Heart disease Mother    Stroke Mother    Hypertension Mother    Diabetes Sister    Hyperlipidemia Brother    Hypertension Brother    Cancer Maternal Grandmother 82       pancreatic   Prostate cancer Neg Hx    Kidney disease Neg Hx    Breast cancer Neg Hx    Allergies  Allergen Reactions   Erythromycin Swelling   Bee Venom Hives and Swelling    Patient Care Team: Gwyneth Sprout, FNP as PCP - General (Family Medicine)   Medications: Outpatient Medications Prior to Visit  Medication Sig   albuterol (VENTOLIN HFA) 108 (90 Base) MCG/ACT inhaler Inhale 2 puffs into the lungs every 6 (six) hours as needed for wheezing or shortness of breath.   B-COMPLEX-C PO Take by mouth.   buPROPion (WELLBUTRIN XL) 150 MG 24 hr tablet TAKE 1 TABLET BY MOUTH DAILY    Cholecalciferol (VITAMIN D3) 250 MCG (10000 UT) TABS Take 10,000 Units by mouth at bedtime.   fexofenadine (ALLEGRA) 180 MG tablet Take 1 tablet (180 mg total) by mouth every evening.   fluticasone (FLONASE) 50 MCG/ACT nasal spray Place 2 sprays into both nostrils daily as needed for allergies.   montelukast (SINGULAIR) 10 MG tablet TAKE 1 TABLET BY MOUTH AT  BEDTIME   Multiple Vitamin (MULTIVITAMIN WITH  MINERALS) TABS tablet Take 1 tablet by mouth at bedtime.   pantoprazole (PROTONIX) 40 MG tablet TAKE 1 TABLET BY MOUTH TWICE  DAILY   senna-docusate (SENOKOT-S) 8.6-50 MG per tablet Take 2 tablets by mouth 2 (two) times daily.    [DISCONTINUED] amoxicillin-clavulanate (AUGMENTIN) 875-125 MG tablet Take 1 tablet by mouth 2 (two) times daily. (Patient not taking: Reported on 02/04/2022)   [DISCONTINUED] estradiol (ESTRACE) 0.1 MG/GM vaginal cream Place 0.5 g vaginally 2 (two) times a week. Place 0.5g nightly for two weeks then twice a week after (Patient not taking: Reported on 09/10/2021)   [DISCONTINUED] predniSONE (DELTASONE) 10 MG tablet Day 1 & 2 take 6 tablets Day 3 &4 take 5 tablets Day 5 &6 take 4 tablets Day 7 & 8 take 3 tablets Day 9 & 10 take 2 tablets Day 11 & 12 take 1 tablet Day 13 & 14 take 1/2 tablet (Patient not taking: Reported on 02/04/2022)   No facility-administered medications prior to visit.   Review of Systems  Last CBC Lab Results  Component Value Date   WBC 5.1 01/27/2021   HGB 12.7 01/27/2021   HCT 38.9 01/27/2021   MCV 93 01/27/2021   MCH 30.5 01/27/2021   RDW 12.6 01/27/2021   PLT 197 70/35/0093   Last metabolic panel Lab Results  Component Value Date   GLUCOSE 91 01/27/2021   NA 142 01/27/2021   K 4.8 01/27/2021   CL 103 01/27/2021   CO2 24 01/27/2021   BUN 17 01/27/2021   CREATININE 0.6 08/27/2021   EGFR 107 08/27/2021   CALCIUM 10.0 01/27/2021   PROT 6.8 01/27/2021   ALBUMIN 4.8 01/27/2021   LABGLOB 2.0 01/27/2021   AGRATIO 2.4 (H) 01/27/2021    BILITOT 0.3 01/27/2021   ALKPHOS 104 01/27/2021   AST 20 01/27/2021   ALT 16 01/27/2021   ANIONGAP 7 05/20/2020   Last lipids Lab Results  Component Value Date   CHOL 247 (A) 08/27/2021   HDL 94 (A) 08/27/2021   LDLCALC 134 08/27/2021   TRIG 110 08/27/2021   CHOLHDL 2.7 11/02/2018   Last hemoglobin A1c Lab Results  Component Value Date   HGBA1C 5.4 08/27/2021   Last thyroid functions Lab Results  Component Value Date   TSH 1.150 11/02/2018   Last vitamin D Lab Results  Component Value Date   VD25OH 39.5 01/27/2021     Objective    BP 117/89 (BP Location: Left Arm, Patient Position: Sitting, Cuff Size: Normal)   Pulse 69   Temp 98 F (36.7 C) (Oral)   Ht 5' 6" (1.676 m)   Wt 162 lb 3.2 oz (73.6 kg)   LMP 01/14/2020 (Exact Date)   SpO2 100%   BMI 26.18 kg/m   BP Readings from Last 3 Encounters:  02/04/22 117/89  09/10/21 120/73  06/25/21 120/80   Wt Readings from Last 3 Encounters:  02/04/22 162 lb 3.2 oz (73.6 kg)  09/10/21 154 lb 1.6 oz (69.9 kg)  06/25/21 153 lb (69.4 kg)   SpO2 Readings from Last 3 Encounters:  02/04/22 100%  09/10/21 100%  01/09/21 100%   Physical Exam Vitals and nursing note reviewed.  Constitutional:      General: She is awake. She is not in acute distress.    Appearance: Normal appearance. She is well-developed, well-groomed and overweight. She is not ill-appearing, toxic-appearing or diaphoretic.  HENT:     Head: Normocephalic and atraumatic.     Jaw: There is normal jaw occlusion. No  trismus, tenderness, swelling or pain on movement.     Right Ear: Hearing, tympanic membrane, ear canal and external ear normal. There is no impacted cerumen.     Left Ear: Hearing, tympanic membrane, ear canal and external ear normal. There is no impacted cerumen.     Nose: Nose normal. No congestion or rhinorrhea.     Right Turbinates: Not enlarged, swollen or pale.     Left Turbinates: Not enlarged, swollen or pale.     Right Sinus: No  maxillary sinus tenderness or frontal sinus tenderness.     Left Sinus: No maxillary sinus tenderness or frontal sinus tenderness.     Mouth/Throat:     Lips: Pink.     Mouth: Mucous membranes are moist. No injury.     Tongue: No lesions.     Pharynx: Oropharynx is clear. Uvula midline. No pharyngeal swelling, oropharyngeal exudate, posterior oropharyngeal erythema or uvula swelling.     Tonsils: No tonsillar exudate or tonsillar abscesses.  Eyes:     General: Lids are normal. Lids are everted, no foreign bodies appreciated. Vision grossly intact. Gaze aligned appropriately. No allergic shiner or visual field deficit.       Right eye: No discharge.        Left eye: No discharge.     Extraocular Movements: Extraocular movements intact.     Conjunctiva/sclera: Conjunctivae normal.     Right eye: Right conjunctiva is not injected. No exudate.    Left eye: Left conjunctiva is not injected. No exudate.    Pupils: Pupils are equal, round, and reactive to light.  Neck:     Thyroid: No thyroid mass, thyromegaly or thyroid tenderness.     Vascular: No carotid bruit.     Trachea: Trachea normal.  Cardiovascular:     Rate and Rhythm: Normal rate and regular rhythm.     Pulses: Normal pulses.          Carotid pulses are 2+ on the right side and 2+ on the left side.      Radial pulses are 2+ on the right side and 2+ on the left side.       Dorsalis pedis pulses are 2+ on the right side and 2+ on the left side.       Posterior tibial pulses are 2+ on the right side and 2+ on the left side.     Heart sounds: Normal heart sounds, S1 normal and S2 normal. No murmur heard.    No friction rub. No gallop.  Pulmonary:     Effort: Pulmonary effort is normal. No respiratory distress.     Breath sounds: Normal breath sounds and air entry. No stridor. No wheezing, rhonchi or rales.  Chest:     Chest wall: No tenderness.  Abdominal:     General: Abdomen is flat. Bowel sounds are normal. There is no  distension.     Palpations: Abdomen is soft. There is no mass.     Tenderness: There is no abdominal tenderness. There is no right CVA tenderness, left CVA tenderness, guarding or rebound.     Hernia: No hernia is present.  Genitourinary:    Comments: Exam deferred; denies complaints Musculoskeletal:        General: No swelling, tenderness, deformity or signs of injury. Normal range of motion.     Cervical back: Full passive range of motion without pain, normal range of motion and neck supple. No edema, rigidity or tenderness. No muscular tenderness.  Right lower leg: No edema.     Left lower leg: No edema.  Lymphadenopathy:     Cervical: No cervical adenopathy.     Right cervical: No superficial, deep or posterior cervical adenopathy.    Left cervical: No superficial, deep or posterior cervical adenopathy.  Skin:    General: Skin is warm and dry.     Capillary Refill: Capillary refill takes less than 2 seconds.     Coloration: Skin is not jaundiced or pale.     Findings: No bruising, erythema, lesion or rash.  Neurological:     General: No focal deficit present.     Mental Status: She is alert and oriented to person, place, and time. Mental status is at baseline.     GCS: GCS eye subscore is 4. GCS verbal subscore is 5. GCS motor subscore is 6.     Sensory: Sensation is intact. No sensory deficit.     Motor: Motor function is intact. No weakness.     Coordination: Coordination is intact. Coordination normal.     Gait: Gait is intact. Gait normal.  Psychiatric:        Attention and Perception: Attention and perception normal.        Mood and Affect: Mood and affect normal.        Speech: Speech normal.        Behavior: Behavior normal. Behavior is cooperative.        Thought Content: Thought content normal.        Cognition and Memory: Cognition and memory normal.        Judgment: Judgment normal.    Last depression screening scores    09/10/2021    4:22 PM 01/09/2021     4:03 PM 05/10/2020    2:42 PM  PHQ 2/9 Scores  PHQ - 2 Score 0 0 0  PHQ- 9 Score 0 0 0   Last fall risk screening    09/10/2021    4:22 PM  [place] in the past year? 0  Number falls in past yr: 0  Injury with Fall? 0  Risk for fall due to : No Fall Risks  Follow up Falls evaluation completed   Last Audit-C alcohol use screening    09/10/2021    4:22 PM  Alcohol Use Disorder Test (AUDIT)  1. How often do you have a drink containing alcohol? 0  2. How many drinks containing alcohol do you have on a typical day when you are drinking? 0  3. How often do you have six or more drinks on one occasion? 0  AUDIT-C Score 0   A score of 3 or more in women, and 4 or more in men indicates increased risk for alcohol abuse, EXCEPT if all of the points are from question 1   No results found for any visits on 02/04/22.  Assessment & Plan    Routine Health Maintenance and Physical Exam  Exercise Activities and Dietary recommendations  Goals   None     Immunization History  Administered Date(s) Administered   DTaP 05/28/1966, 07/02/1966, 08/06/1966, 10/28/1967   Influenza Split 11/14/2012   Influenza,inj,Quad PF,6+ Mos 12/10/2013, 12/11/2014, 10/30/2015, 11/02/2018, 02/04/2022   Influenza-Unspecified 12/07/2016, 11/16/2017, 01/08/2020, 11/27/2020   Moderna Sars-Covid-2 Vaccination 05/02/2019, 05/30/2019   OPV 05/28/1966, 08/06/1966, 10/28/1967, 07/26/1975   PNEUMOCOCCAL CONJUGATE-20 01/09/2021   Pneumococcal Polysaccharide-23 10/21/2009   Td 05/04/1988, 07/09/1998, 07/27/2003, 05/21/2009   Tdap 05/21/2009, 04/14/2018    Health Maintenance  Topic  Date Due   Hepatitis C Screening  Never done   Zoster Vaccines- Shingrix (1 of 2) Never done   COVID-19 Vaccine (3 - Moderna risk series) 02/20/2022 (Originally 06/27/2019)   MAMMOGRAM  01/27/2024   COLONOSCOPY (Pts 45-41yr Insurance coverage will need to be confirmed)  04/16/2024   DTaP/Tdap/Td (11 - Td or Tdap) 04/13/2028    INFLUENZA VACCINE  Completed   HIV Screening  Completed   HPV VACCINES  Aged Out   PAP SMEAR-Modifier  Discontinued    Discussed health benefits of physical activity, and encouraged her to engage in regular exercise appropriate for her age and condition.  Problem List Items Addressed This Visit       Respiratory   Airway hyperreactivity    Chronic, stable Use of PRN albuterol inhaler Inhaler she has is UTD No further concerns No recent hospitalizations s/s asthma         Other   Annual physical exam - Primary    UTD on dental, vision, and derm Flu today Advice given on shingles Things to do to keep yourself healthy  - Exercise at least 30-45 minutes a day, 3-4 days a week.  - Eat a low-fat diet with lots of fruits and vegetables, up to 7-9 servings per day.  - Seatbelts can save your life. Wear them always.  - Smoke detectors on every level of your home, check batteries every year.  - Eye Doctor - have an eye exam every 1-2 years  - Safe sex - if you may be exposed to STDs, use a condom.  - Alcohol -  If you drink, do it moderately, less than 2 drinks per day.  - [place] Choose someone to speak for you if you are not able.  - Depression is common in our stressful world.If you're feeling down or losing interest in things you normally enjoy, please come in for a visit.  - Violence - If anyone is threatening or hurting you, please call immediately.       Relevant Orders   Comprehensive Metabolic Panel (CMET)   CBC   TSH   Lipid panel   Avitaminosis D    Chronic, previously stable Repeat labs given weight increase and ongoing depression      Relevant Orders   Vitamin D (25 hydroxy)   Depression, recurrent (HCC)    Chronic, stable Continue wellbutrin 150 mg daily RTC if dose changes are necessary PHQ9 reviewed; contracted for safety- denies SI or HI      Elevated LDL cholesterol level    Chronic, stable with diet/exercise Repeat  FLP recommend diet low in saturated fat and regular exercise - 30 min at least 5 times per week       Relevant Orders   Lipid panel   Encounter for hepatitis C screening test for low risk patient   Relevant Orders   Hepatitis C Antibody   Encounter for screening for HIV   Relevant Orders   HIV antibody (with reflex)   Need for immunization against influenza   Relevant Orders   Flu Vaccine QUAD 645moM (Fluarix, Fluzone & Alfiuria Quad PF) (Completed)   Return in about 1 year (around 02/05/2023), or if symptoms worsen or fail to improve- back pains.    I,Vonna KotykFNP, have reviewed all documentation for this visit. The documentation on 02/04/22 for the exam, diagnosis, procedures, and orders are all accurate and complete.  ElGwyneth SproutFNPaguate  [phone] (phone) [phone] (fax)  [place]

## 2022-02-04 NOTE — Assessment & Plan Note (Addendum)
Chronic, previously stable Repeat labs given weight increase and ongoing depression

## 2022-02-04 NOTE — Assessment & Plan Note (Signed)
Chronic, stable with diet/exercise Repeat FLP recommend diet low in saturated fat and regular exercise - 30 min at least 5 times per week

## 2022-02-04 NOTE — Assessment & Plan Note (Signed)
Chronic, stable Continue wellbutrin 150 mg daily RTC if dose changes are necessary PHQ9 reviewed; contracted for safety- denies SI or HI

## 2022-02-04 NOTE — Assessment & Plan Note (Signed)
Chronic, stable Use of PRN albuterol inhaler Inhaler she has is UTD No further concerns No recent hospitalizations s/s asthma

## 2022-02-04 NOTE — Assessment & Plan Note (Signed)
UTD on dental, vision, and derm Flu today Advice given on shingles Things to do to keep yourself healthy  - Exercise at least 30-45 minutes a day, 3-4 days a week.  - Eat a low-fat diet with lots of fruits and vegetables, up to 7-9 servings per day.  - Seatbelts can save your life. Wear them always.  - Smoke detectors on every level of your home, check batteries every year.  - Eye Doctor - have an eye exam every 1-2 years  - Safe sex - if you may be exposed to STDs, use a condom.  - Alcohol -  If you drink, do it moderately, less than 2 drinks per day.  - [place]. Choose someone to speak for you if you are not able.  - Depression is common in our stressful world.If you're feeling down or losing interest in things you normally enjoy, please come in for a visit.  - Violence - If anyone is threatening or hurting you, please call immediately.

## 2022-02-05 LAB — CBC
Hematocrit: 38.7 % (ref 34.0–46.6)
Hemoglobin: 12.5 g/dL (ref 11.1–15.9)
MCH: 29.5 pg (ref 26.6–33.0)
MCHC: 32.3 g/dL (ref 31.5–35.7)
MCV: 91 fL (ref 79–97)
Platelets: 203 10*3/uL (ref 150–450)
RBC: 4.24 x10E6/uL (ref 3.77–5.28)
RDW: 12.3 % (ref 11.7–15.4)
WBC: 5.4 10*3/uL (ref 3.4–10.8)

## 2022-02-05 LAB — LIPID PANEL
Chol/HDL Ratio: 2.3 ratio (ref 0.0–4.4)
Cholesterol, Total: 223 mg/dL — ABNORMAL HIGH (ref 100–199)
HDL: 97 mg/dL (ref >39)
LDL Chol Calc (NIH): 105 mg/dL — ABNORMAL HIGH (ref 0–99)
Triglycerides: 122 mg/dL (ref 0–149)
VLDL Cholesterol Cal: 21 mg/dL (ref 5–40)

## 2022-02-05 LAB — COMPREHENSIVE METABOLIC PANEL
ALT: 21 IU/L (ref 0–32)
AST: 19 IU/L (ref 0–40)
Albumin/Globulin Ratio: 2.6 — ABNORMAL HIGH (ref 1.2–2.2)
Albumin: 4.7 g/dL (ref 3.8–4.9)
Alkaline Phosphatase: 97 IU/L (ref 44–121)
BUN/Creatinine Ratio: 20 (ref 9–23)
BUN: 13 mg/dL (ref 6–24)
Bilirubin Total: 0.4 mg/dL (ref 0.0–1.2)
CO2: 24 mmol/L (ref 20–29)
Calcium: 9.7 mg/dL (ref 8.7–10.2)
Chloride: 102 mmol/L (ref 96–106)
Creatinine, Ser: 0.64 mg/dL (ref 0.57–1.00)
Globulin, Total: 1.8 g/dL (ref 1.5–4.5)
Glucose: 93 mg/dL (ref 70–99)
Potassium: 4.3 mmol/L (ref 3.5–5.2)
Sodium: 142 mmol/L (ref 134–144)
Total Protein: 6.5 g/dL (ref 6.0–8.5)
eGFR: 104 mL/min/{1.73_m2} (ref >59)

## 2022-02-05 LAB — TSH: TSH: 1.7 u[IU]/mL (ref 0.450–4.500)

## 2022-02-05 LAB — HIV ANTIBODY (ROUTINE TESTING W REFLEX): HIV Screen 4th Generation wRfx: NONREACTIVE

## 2022-02-05 LAB — VITAMIN D 25 HYDROXY (VIT D DEFICIENCY, FRACTURES): Vit D, 25-Hydroxy: 30.9 ng/mL (ref 30.0–100.0)

## 2022-02-05 LAB — HEPATITIS C ANTIBODY: Hep C Virus Ab: NONREACTIVE

## 2022-02-05 NOTE — Progress Notes (Signed)
Cholesterol is improved from last year; however, total and bad/LDL cholesterol remain elevated. I continue to recommend diet low in saturated fat and regular exercise - 30 min at least 5 times per week. Risk of heart attack and/or stroke remains low. The 10-year ASCVD risk score (Arnett DK, et al., 2019) is: 1.2%   Values used to calculate the score:     Age: 56 years     Sex: Female     Is Non-Hispanic African American: No     Diabetic: No     Tobacco smoker: No     Systolic Blood Pressure: 525 mmHg     Is BP treated: No     HDL Cholesterol: 97 mg/dL     Total Cholesterol: 223 mg/dL Vit D is decreased; recommend 2000-5000 IU daily OTC to assist. Gwyneth Sprout, [place] [address] [place], [place] [address] [phone] (phone) [phone] (fax) [place]

## 2022-03-10 ENCOUNTER — Other Ambulatory Visit: Payer: Self-pay | Admitting: Family Medicine

## 2022-04-03 ENCOUNTER — Other Ambulatory Visit: Payer: Self-pay | Admitting: Family Medicine

## 2022-08-16 ENCOUNTER — Other Ambulatory Visit: Payer: Self-pay | Admitting: Family Medicine

## 2022-08-17 NOTE — Telephone Encounter (Signed)
Called pt to schedule OV. Pt stated that she thought she only needed to be seen yearly. Note in OV if 01/2022:  Return in about 1 year (around 02/05/2023), or if symptoms worsen or fail to improve- back pains.

## 2022-08-17 NOTE — Telephone Encounter (Signed)
Requested Prescriptions  Pending Prescriptions Disp Refills   buPROPion (WELLBUTRIN XL) 150 MG 24 hr tablet [Pharmacy Med Name: buPROPion HCl ER (XL) 150 MG Oral Tablet Extended Release 24 Hour] 90 tablet 1    Sig: TAKE 1 TABLET BY MOUTH DAILY     Psychiatry: Antidepressants - bupropion Failed - 08/16/2022  7:59 AM      Failed - Valid encounter within last 6 months    Recent Outpatient Visits           6 months ago Annual physical exam   [facility] Merita Norton T, FNP   11 months ago Mild intermittent asthma with acute exacerbation   [facility] Merita Norton T, FNP   1 year ago COVID-19   [facility] [place], Darl Householder, DO   1 year ago Anxiety   [facility], Marzella Schlein, MD   2 years ago Depression, major, single episode, complete remission [facility])   [facility] Joycelyn Man M, [place]              Passed - Cr in normal range and within 360 days    Creatinine, Ser  Date Value Ref Range Status  02/04/2022 0.64 0.57 - 1.00 mg/dL Final         Passed - AST in normal range and within 360 days    AST  Date Value Ref Range Status  02/04/2022 19 0 - 40 IU/L Final         Passed - ALT in normal range and within 360 days    ALT  Date Value Ref Range Status  02/04/2022 21 0 - 32 IU/L Final         Passed - Completed PHQ-2 or PHQ-9 in the last 360 days      Passed - Last BP in normal range    BP Readings from Last 1 Encounters:  02/04/22 117/89

## 2022-12-01 ENCOUNTER — Other Ambulatory Visit: Payer: Self-pay | Admitting: Family Medicine

## 2022-12-29 ENCOUNTER — Other Ambulatory Visit: Payer: Self-pay | Admitting: Family Medicine

## 2022-12-29 NOTE — Telephone Encounter (Signed)
Please advise 

## 2023-01-01 ENCOUNTER — Other Ambulatory Visit: Payer: Self-pay | Admitting: Family Medicine

## 2023-01-01 DIAGNOSIS — Z1231 Encounter for screening mammogram for malignant neoplasm of breast: Secondary | ICD-10-CM

## 2023-02-05 ENCOUNTER — Ambulatory Visit
Admission: RE | Admit: 2023-02-05 | Discharge: 2023-02-05 | Disposition: A | Discharge status: Home / Self Care | Payer: 59 | Source: Ambulatory Visit | Attending: Family Medicine | Admitting: Family Medicine

## 2023-02-05 DIAGNOSIS — Z1231 Encounter for screening mammogram for malignant neoplasm of breast: Secondary | ICD-10-CM | POA: Insufficient documentation

## 2023-02-09 ENCOUNTER — Encounter: Payer: Self-pay | Admitting: Family Medicine

## 2023-02-09 ENCOUNTER — Ambulatory Visit (INDEPENDENT_AMBULATORY_CARE_PROVIDER_SITE_OTHER): Payer: 59 | Admitting: Family Medicine

## 2023-02-09 VITALS — BP 111/67 | HR 64 | Ht 66.0 in | Wt 130.0 lb

## 2023-02-09 DIAGNOSIS — Z8249 Family history of ischemic heart disease and other diseases of the circulatory system: Secondary | ICD-10-CM | POA: Insufficient documentation

## 2023-02-09 DIAGNOSIS — F339 Major depressive disorder, recurrent, unspecified: Secondary | ICD-10-CM

## 2023-02-09 DIAGNOSIS — J4521 Mild intermittent asthma with (acute) exacerbation: Secondary | ICD-10-CM

## 2023-02-09 DIAGNOSIS — Z0000 Encounter for general adult medical examination without abnormal findings: Secondary | ICD-10-CM

## 2023-02-09 DIAGNOSIS — E559 Vitamin D deficiency, unspecified: Secondary | ICD-10-CM | POA: Diagnosis not present

## 2023-02-09 DIAGNOSIS — Z833 Family history of diabetes mellitus: Secondary | ICD-10-CM | POA: Diagnosis not present

## 2023-02-09 DIAGNOSIS — E7800 Pure hypercholesterolemia, unspecified: Secondary | ICD-10-CM

## 2023-02-09 MED ORDER — ALBUTEROL SULFATE HFA 108 (90 BASE) MCG/ACT IN AERS: 2.0000 | INHALATION_SPRAY | Freq: Four times a day (QID) | RESPIRATORY_TRACT | 0 refills | Status: AC | PRN

## 2023-02-09 MED ORDER — FLUTICASONE PROPIONATE 50 MCG/ACT NA SUSP: 2.0000 | Freq: Every day | NASAL | 3 refills | Status: AC | PRN

## 2023-02-09 MED ORDER — BUPROPION HCL ER (XL) 150 MG PO TB24: 150.0000 mg | ORAL_TABLET | Freq: Every day | ORAL | 3 refills | Status: DC

## 2023-02-09 MED ORDER — MONTELUKAST SODIUM 10 MG PO TABS: 10.0000 mg | ORAL_TABLET | Freq: Every day | ORAL | 3 refills | Status: DC

## 2023-02-09 MED ORDER — PANTOPRAZOLE SODIUM 40 MG PO TBEC: 40.0000 mg | DELAYED_RELEASE_TABLET | Freq: Two times a day (BID) | ORAL | 3 refills | Status: DC

## 2023-02-09 NOTE — Assessment & Plan Note (Signed)
Chronic, on OTC supplementation Repeat labs per pt request; not eligible for DEXA at this time

## 2023-02-09 NOTE — Assessment & Plan Note (Signed)
Repeat LP; recommend diet low in saturated fat and regular exercise - 30 min at least 5 times per week

## 2023-02-09 NOTE — Assessment & Plan Note (Signed)
Request for repeat A1c Continue to recommend balanced, lower carb meals. Smaller meal size, adding snacks. Choosing water as drink of choice and increasing purposeful exercise.

## 2023-02-09 NOTE — Assessment & Plan Note (Signed)
Chronic, stable Request for inhaler refill; reports 1 inhaler/year is sufficient to assist flares CTM

## 2023-02-09 NOTE — Assessment & Plan Note (Signed)

## 2023-02-09 NOTE — Assessment & Plan Note (Signed)
Request for Lpa to assist The 10-year ASCVD risk score (Arnett DK, et al., 2019) is: 1.3% recommend diet low in saturated fat and regular exercise - 30 min at least 5 times per week

## 2023-02-09 NOTE — Progress Notes (Signed)
 Complete physical exam  Patient: Janice Donovan   DOB: 01-Apr-1965   57 y.o. Female  MRN: [id number] Visit Date: 02/09/2023  Today's healthcare provider: Kelly ONEIDA Cedar, FNP  Introduced to nurse practitioner role and practice setting.  All questions answered.  Discussed provider/patient relationship and expectations.  Chief Complaint  Patient presents with   Annual Exam   Subjective    Janice Donovan is a 57 y.o. female who presents today for a complete physical exam.  She reports consuming a low fat and low carb  diet.  Walking 4x/week 20-40 minutes.  She generally feels fairly well. She reports sleeping poorly. She does have additional problems to discuss today.   HPI   Patient has lost 32# since CPE last December; purposeful changes.  Past Medical History:  Diagnosis Date   Adrenal nodule (HCC)    Allergic rhinitis    Asthma    Cervical polyp    Constipation    Contraception management 10/16/2014   Generalized abdominal pain    GERD (gastroesophageal reflux disease)    History of mammogram 2015;01/02/15; 01/06/16   birads 1; birads 1; neg   History of Papanicolaou smear of cervix 2011;03/10/15   neg; neg   IBS (irritable bowel syndrome)    Mood disorder (HCC)    Obesity    Plantar fasciitis of left foot 09/29/2012   Pneumonia    2010   PONV (postoperative nausea and vomiting)    had nausea one time, hard to wake up many years ago   Renal lesion    Vaginal birth after cesarean 1993   Past Surgical History:  Procedure Laterality Date   ABDOMINAL HYSTERECTOMY  04/22   ANTERIOR CERVICAL DECOMP/DISCECTOMY FUSION  2005   C4-6   CESAREAN SECTION  1990   colonoscopy with polypectomy  06/2007; 2016   abd pain and chronic coinstipation   CYSTOSCOPY N/A 05/21/2020   Procedure: CYSTOSCOPY;  Surgeon: Janice Garnette BIRCH, MD;  Location: [facility];  Service: Gynecology;  Laterality: N/A;   DIAGNOSTIC LAPAROSCOPY     normal pelvis   ESOPHAGOGASTRODUODENOSCOPY  06/2007;  2016   gastric polyps   HYSTEROSCOPY WITH D & C N/A 10/13/2018   Procedure: DILATATION AND CURETTAGE /HYSTEROSCOPY;  Surgeon: Janice Lamar SQUIBB, MD;  Location: [facility];  Service: Gynecology;  Laterality: N/A;   LEEP  12/06/2008   cx polyp removal-path was HPV changes - [facility]   TOTAL LAPAROSCOPIC HYSTERECTOMY WITH SALPINGECTOMY Bilateral 05/21/2020   Procedure: TOTAL LAPAROSCOPIC HYSTERECTOMY WITH SALPINGECTOMY, LYSIS OF ADHESIONS;  Surgeon: Janice Garnette BIRCH, MD;  Location: [facility];  Service: Gynecology;  Laterality: Bilateral;   Social History   Socioeconomic History   Marital status: Married    Spouse name: Not on file   Number of children: 2   Years of education: H/S   Highest education level: 12th grade  Occupational History   Occupation: Nurse, Adult: [employer]  Tobacco Use   Smoking status: Never   Smokeless tobacco: Never  Vaping Use   Vaping status: Never Used  Substance and Sexual Activity   Alcohol use: No    Alcohol/week: 0.0 standard drinks of alcohol   Drug use: No   Sexual activity: Yes    Partners: Male    Birth control/protection: None, Surgical    Comment: ablation  Other Topics Concern   Not on file  Social History Narrative   Phala's daughter Vernell has 3 children -ages 24,2,and 26(2018). Her son is  a Event Organiser.   Social Drivers of Corporate Investment Banker Strain: Low Risk  (02/08/2023)   Overall Financial Resource Strain (CARDIA)    Difficulty of Paying Living Expenses: Not hard at all  Food Insecurity: No Food Insecurity (02/08/2023)   Hunger Vital Sign    Worried About Running Out of Food in the Last Year: Never true    Ran Out of Food in the Last Year: Never true  Transportation Needs: No Transportation Needs (02/08/2023)   PRAPARE - Administrator, Civil Service (Medical): No    Lack of Transportation (Non-Medical): No  Physical Activity: Insufficiently Active (02/08/2023)   Exercise Vital Sign    Days of  Exercise per Week: 4 days    Minutes of Exercise per Session: 20 min  Stress: No Stress Concern Present (02/08/2023)   [employer] of Occupational Health - Occupational Stress Questionnaire    Feeling of Stress : Only a little  Social Connections: Socially Integrated (02/08/2023)   Social Connection and Isolation Panel [NHANES]    Frequency of Communication with Friends and Family: More than three times a week    Frequency of Social Gatherings with Friends and Family: Three times a week    Attends Religious Services: More than 4 times per year    Active Member of Clubs or Organizations: Yes    Attends Banker Meetings: More than 4 times per year    Marital Status: Married  Catering Manager Violence: Not At Risk (01/11/2017)   Humiliation, Afraid, Rape, and Kick questionnaire    Fear of Current or Ex-Partner: No    Emotionally Abused: No    Physically Abused: No    Sexually Abused: No   Family Status  Relation Name Status   Mother  Deceased   Father  Deceased at age 63   Sister  Alive   Sister  Alive   Brother  Alive   MGM  Deceased   MGF  Deceased   PGM  Deceased   PGF  Deceased   Neg Hx  (Not Specified)  No partnership data on file   Family History  Problem Relation Age of Onset   COPD Mother    Heart disease Mother    Stroke Mother    Hypertension Mother    Diabetes Father    Hypertension Father    Benign prostatic hyperplasia Father    Bladder Cancer Father    Hyperlipidemia Father    Dementia Father        Lewy Body Dementia   Asthma Father    Melanoma Father    Diabetes Sister    Hyperlipidemia Brother    Hypertension Brother    Cancer Maternal Grandmother 4       pancreatic   Prostate cancer Neg Hx    Kidney disease Neg Hx    Breast cancer Neg Hx    Allergies  Allergen Reactions   Erythromycin Swelling   Bee Venom Hives and Swelling    Patient Care Team: Janice Janice DASEN, FNP as PCP - General (Family Medicine)    Medications: Outpatient Medications Prior to Visit  Medication Sig   B-COMPLEX-C PO Take by mouth.   Cholecalciferol (VITAMIN D3) 250 MCG (10000 UT) TABS Take 10,000 Units by mouth at bedtime.   fexofenadine  (ALLEGRA ) 180 MG tablet Take 1 tablet (180 mg total) by mouth every evening.   Magnesium 250 MG TABS    Multiple Vitamin (MULTIVITAMIN WITH MINERALS) TABS tablet Take  1 tablet by mouth at bedtime.   senna-docusate (SENOKOT-S) 8.6-50 MG per tablet Take 2 tablets by mouth 2 (two) times daily.    [DISCONTINUED] albuterol  (VENTOLIN  HFA) 108 (90 Base) MCG/ACT inhaler Inhale 2 puffs into the lungs every 6 (six) hours as needed for wheezing or shortness of breath.   [DISCONTINUED] buPROPion  (WELLBUTRIN  XL) 150 MG 24 hr tablet Take 1 tablet (150 mg total) by mouth daily.   [DISCONTINUED] fluticasone  (FLONASE ) 50 MCG/ACT nasal spray Place 2 sprays into both nostrils daily as needed for allergies.   [DISCONTINUED] montelukast  (SINGULAIR ) 10 MG tablet TAKE 1 TABLET BY MOUTH AT  BEDTIME   [DISCONTINUED] pantoprazole  (PROTONIX ) 40 MG tablet TAKE 1 TABLET BY MOUTH TWICE  DAILY   No facility-administered medications prior to visit.   Last CBC Lab Results  Component Value Date   WBC 5.4 02/04/2022   HGB 12.5 02/04/2022   HCT 38.7 02/04/2022   MCV 91 02/04/2022   MCH 29.5 02/04/2022   RDW 12.3 02/04/2022   PLT 203 02/04/2022   Last metabolic panel Lab Results  Component Value Date   GLUCOSE 93 02/04/2022   NA 142 02/04/2022   K 4.3 02/04/2022   CL 102 02/04/2022   CO2 24 02/04/2022   BUN 13 02/04/2022   CREATININE 0.64 02/04/2022   EGFR 104 02/04/2022   CALCIUM  9.7 02/04/2022   PROT 6.5 02/04/2022   ALBUMIN 4.7 02/04/2022   LABGLOB 1.8 02/04/2022   AGRATIO 2.6 (H) 02/04/2022   BILITOT 0.4 02/04/2022   ALKPHOS 97 02/04/2022   AST 19 02/04/2022   ALT 21 02/04/2022   ANIONGAP 7 05/20/2020   Last lipids Lab Results  Component Value Date   CHOL 223 (H) 02/04/2022   HDL 97  02/04/2022   LDLCALC 105 (H) 02/04/2022   TRIG 122 02/04/2022   CHOLHDL 2.3 02/04/2022   Last hemoglobin A1c Lab Results  Component Value Date   HGBA1C 5.4 08/27/2021   Last thyroid functions Lab Results  Component Value Date   TSH 1.700 02/04/2022   Last vitamin D  Lab Results  Component Value Date   VD25OH 30.9 02/04/2022   Last vitamin B12 and Folate Lab Results  Component Value Date   VITAMINB12 527 06/03/2015   FOLATE >20.0 06/03/2015    Objective    BP 111/67 (BP Location: Right Arm, Patient Position: Sitting, Cuff Size: Normal)   Pulse 64   Ht 5' 6 (1.676 m)   Wt 130 lb (59 kg)   LMP 01/14/2020 (Exact Date)   BMI 20.98 kg/m   BP Readings from Last 3 Encounters:  02/09/23 111/67  02/04/22 117/89  09/10/21 120/73   Wt Readings from Last 3 Encounters:  02/09/23 130 lb (59 kg)  02/04/22 162 lb 3.2 oz (73.6 kg)  09/10/21 154 lb 1.6 oz (69.9 kg)   SpO2 Readings from Last 3 Encounters:  02/04/22 100%  09/10/21 100%  01/09/21 100%   Physical Exam Vitals and nursing note reviewed.  Constitutional:      General: She is awake. She is not in acute distress.    Appearance: Normal appearance. She is well-developed, well-groomed and normal weight. She is not ill-appearing, toxic-appearing or diaphoretic.  HENT:     Head: Normocephalic and atraumatic.     Jaw: There is normal jaw occlusion. No trismus, tenderness, swelling or pain on movement.     Right Ear: Hearing, tympanic membrane, ear canal and external ear normal. There is no impacted cerumen.     Left  Ear: Hearing, tympanic membrane, ear canal and external ear normal. There is no impacted cerumen.     Nose: Nose normal. No congestion or rhinorrhea.     Right Turbinates: Not enlarged, swollen or pale.     Left Turbinates: Not enlarged, swollen or pale.     Right Sinus: No maxillary sinus tenderness or frontal sinus tenderness.     Left Sinus: No maxillary sinus tenderness or frontal sinus tenderness.      Mouth/Throat:     Lips: Pink.     Mouth: Mucous membranes are moist. No injury.     Tongue: No lesions.     Pharynx: Oropharynx is clear. Uvula midline. No pharyngeal swelling, oropharyngeal exudate, posterior oropharyngeal erythema or uvula swelling.     Tonsils: No tonsillar exudate or tonsillar abscesses.  Eyes:     General: Lids are normal. Lids are everted, no foreign bodies appreciated. Vision grossly intact. Gaze aligned appropriately. No allergic shiner or visual field deficit.       Right eye: No discharge.        Left eye: No discharge.     Extraocular Movements: Extraocular movements intact.     Conjunctiva/sclera: Conjunctivae normal.     Right eye: Right conjunctiva is not injected. No exudate.    Left eye: Left conjunctiva is not injected. No exudate.    Pupils: Pupils are equal, round, and reactive to light.  Neck:     Thyroid: No thyroid mass, thyromegaly or thyroid tenderness.     Vascular: No carotid bruit.     Trachea: Trachea normal.  Cardiovascular:     Rate and Rhythm: Normal rate and regular rhythm.     Pulses: Normal pulses.          Carotid pulses are 2+ on the right side and 2+ on the left side.      Radial pulses are 2+ on the right side and 2+ on the left side.       Dorsalis pedis pulses are 2+ on the right side and 2+ on the left side.       Posterior tibial pulses are 2+ on the right side and 2+ on the left side.     Heart sounds: Normal heart sounds, S1 normal and S2 normal. No murmur heard.    No friction rub. No gallop.  Pulmonary:     Effort: Pulmonary effort is normal. No respiratory distress.     Breath sounds: Normal breath sounds and air entry. No stridor. No wheezing, rhonchi or rales.  Chest:     Chest wall: No tenderness.  Abdominal:     General: Abdomen is flat. Bowel sounds are normal. There is no distension.     Palpations: Abdomen is soft. There is no mass.     Tenderness: There is no abdominal tenderness. There is no right CVA  tenderness, left CVA tenderness, guarding or rebound.     Hernia: No hernia is present.  Genitourinary:    Comments: Exam deferred; denies complaints Musculoskeletal:        General: No swelling, tenderness, deformity or signs of injury. Normal range of motion.     Cervical back: Full passive range of motion without pain, normal range of motion and neck supple. No edema, rigidity or tenderness. No muscular tenderness.     Right lower leg: No edema.     Left lower leg: No edema.  Lymphadenopathy:     Cervical: No cervical adenopathy.     Right cervical: No  superficial, deep or posterior cervical adenopathy.    Left cervical: No superficial, deep or posterior cervical adenopathy.  Skin:    General: Skin is warm and dry.     Capillary Refill: Capillary refill takes less than 2 seconds.     Coloration: Skin is not jaundiced or pale.     Findings: No bruising, erythema, lesion or rash.  Neurological:     General: No focal deficit present.     Mental Status: She is alert and oriented to person, place, and time. Mental status is at baseline.     GCS: GCS eye subscore is 4. GCS verbal subscore is 5. GCS motor subscore is 6.     Sensory: Sensation is intact. No sensory deficit.     Motor: Motor function is intact. No weakness.     Coordination: Coordination is intact. Coordination normal.     Gait: Gait is intact. Gait normal.  Psychiatric:        Attention and Perception: Attention and perception normal.        Mood and Affect: Mood and affect normal.        Speech: Speech normal.        Behavior: Behavior normal. Behavior is cooperative.        Thought Content: Thought content normal.        Cognition and Memory: Cognition and memory normal.        Judgment: Judgment normal.     Last depression screening scores    02/09/2023    8:50 AM 09/10/2021    4:22 PM 01/09/2021    4:03 PM  PHQ 2/9 Scores  PHQ - 2 Score 0 0 0  PHQ- 9 Score 1 0 0   Last fall risk screening    09/10/2021     4:22 PM  Fall Risk   Falls in the past year? 0  Number falls in past yr: 0  Injury with Fall? 0  Risk for fall due to : No Fall Risks  Follow up Falls evaluation completed   Last Audit-C alcohol use screening    02/08/2023   11:17 AM  Alcohol Use Disorder Test (AUDIT)  1. How often do you have a drink containing alcohol? 0  3. How often do you have six or more drinks on one occasion? 0   A score of 3 or more in women, and 4 or more in men indicates increased risk for alcohol abuse, EXCEPT if all of the points are from question 1   No results found for any visits on 02/09/23.  Assessment & Plan    Routine Health Maintenance and Physical Exam  Exercise Activities and Dietary recommendations  Goals   None     Immunization History  Administered Date(s) Administered   DTaP 05/28/1966, 07/02/1966, 08/06/1966, 10/28/1967   Influenza Split 11/14/2012   Influenza,inj,Quad PF,6+ Mos 12/10/2013, 12/11/2014, 10/30/2015, 11/02/2018, 02/04/2022   Influenza-Unspecified 12/07/2016, 11/16/2017, 01/08/2020, 11/27/2020, 11/24/2022   Moderna Sars-Covid-2 Vaccination 05/02/2019, 05/30/2019   OPV 05/28/1966, 08/06/1966, 10/28/1967, 07/26/1975   PNEUMOCOCCAL CONJUGATE-20 01/09/2021   Pneumococcal Polysaccharide-23 10/21/2009   Td 05/04/1988, 07/09/1998, 07/27/2003, 05/21/2009   Tdap 05/21/2009, 04/14/2018    Health Maintenance  Topic Date Due   Zoster Vaccines- Shingrix (1 of 2) Never done   COVID-19 Vaccine (3 - Moderna risk series) 06/27/2019   MAMMOGRAM  01/27/2024   Colonoscopy  04/16/2024   DTaP/Tdap/Td (11 - Td or Tdap) 04/13/2028   Pneumococcal Vaccine 63-29 Years old  Completed   INFLUENZA  VACCINE  Completed   Hepatitis C Screening  Completed   HIV Screening  Completed   HPV VACCINES  Aged Out    Discussed health benefits of physical activity, and encouraged her to engage in regular exercise appropriate for her age and condition.  Problem List Items Addressed This Visit        Respiratory   Airway hyperreactivity   Chronic, stable Request for inhaler refill; reports 1 inhaler/year is sufficient to assist flares CTM      Relevant Medications   montelukast  (SINGULAIR ) 10 MG tablet   albuterol  (VENTOLIN  HFA) 108 (90 Base) MCG/ACT inhaler     Other   Annual physical exam - Primary   Things to do to keep yourself healthy  - Exercise at least 30-45 minutes a day, 3-4 days a week.  - Eat a low-fat diet with lots of fruits and vegetables, up to 7-9 servings per day.  - Seatbelts can save your life. Wear them always.  - Smoke detectors on every level of your home, check batteries every year.  - Eye Doctor - have an eye exam every 1-2 years  - Safe sex - if you may be exposed to STDs, use a condom.  - Alcohol -  If you drink, do it moderately, less than 2 drinks per day.  - Health Care Power of Attorney. Choose someone to speak for you if you are not able.  - Depression is common in our stressful world.If you're feeling down or losing interest in things you normally enjoy, please come in for a visit.  - Violence - If anyone is threatening or hurting you, please call immediately.       Relevant Orders   CBC with Differential/Platelet   Comprehensive metabolic panel   TSH   Avitaminosis D   Chronic, on OTC supplementation Repeat labs per pt request; not eligible for DEXA at this time      Relevant Orders   Vitamin D  (25 hydroxy)   Depression, recurrent (HCC)   Chronic, stable; in remission (minor) Continue on wellbutrin  150 Denies SI or HI    02/09/2023    8:50 AM 09/10/2021    4:22 PM 01/09/2021    4:03 PM  PHQ9 SCORE ONLY  PHQ-9 Total Score 1 0 0      02/09/2023    8:50 AM  GAD 7 : Generalized Anxiety Score  Nervous, Anxious, on Edge 0  Control/stop worrying 0  Worry too much - different things 0  Trouble relaxing 0  Restless 0  Easily annoyed or irritable 0  Afraid - awful might happen 0  Total GAD 7 Score 0  Anxiety Difficulty Not  difficult at all          Relevant Medications   buPROPion  (WELLBUTRIN  XL) 150 MG 24 hr tablet   Elevated LDL cholesterol level   Repeat LP recommend diet low in saturated fat and regular exercise - 30 min at least 5 times per week       Relevant Orders   Lipid panel   Family history of ASCVD   Request for Lpa to assist The 10-year ASCVD risk score (Arnett DK, et al., 2019) is: 1.3% recommend diet low in saturated fat and regular exercise - 30 min at least 5 times per week       Relevant Orders   Lipoprotein A (LPA)   Family history of diabetes mellitus   Request for repeat A1c Continue to recommend balanced, lower carb meals. Smaller  meal size, adding snacks. Choosing water as drink of choice and increasing purposeful exercise.       Relevant Orders   Hemoglobin A1c   Return in about 1 year (around 02/10/2024) for annual examination.    LILLETTE Janice ONEIDA Emilio, FNP, have reviewed all documentation for this visit. The documentation on 02/09/23 for the exam, diagnosis, procedures, and orders are all accurate and complete.  Janice ONEIDA Emilio, FNP  [facility] [phone] (phone) [phone] (fax)  [facility]

## 2023-02-09 NOTE — Assessment & Plan Note (Signed)
 Chronic, stable; in remission (minor) Continue on wellbutrin  150 Denies SI or HI    02/09/2023    8:50 AM 09/10/2021    4:22 PM 01/09/2021    4:03 PM  PHQ9 SCORE ONLY  PHQ-9 Total Score 1 0 0      02/09/2023    8:50 AM  GAD 7 : Generalized Anxiety Score  Nervous, Anxious, on Edge 0  Control/stop worrying 0  Worry too much - different things 0  Trouble relaxing 0  Restless 0  Easily annoyed or irritable 0  Afraid - awful might happen 0  Total GAD 7 Score 0  Anxiety Difficulty Not difficult at all

## 2023-02-10 NOTE — Progress Notes (Signed)
 Lpa pending; if elevated. Consider coronary CT scan.  Cholesterol is improved; The 10-year ASCVD risk score (Arnett DK, et al., 2019) is: 1.3%  A1c is now in pre-diabetic range; if desired, repeat A1c in 6 months. Continue to recommend balanced, lower carb meals. Smaller meal size, adding snacks. Choosing water as drink of choice and increasing purposeful exercise.  Vit D continues to improve.

## 2023-02-11 ENCOUNTER — Encounter: Payer: Self-pay | Admitting: Family Medicine

## 2023-02-11 ENCOUNTER — Ambulatory Visit
Admission: RE | Admit: 2023-02-11 | Discharge: 2023-02-11 | Disposition: A | Discharge status: Home / Self Care | Payer: Self-pay | Source: Ambulatory Visit | Attending: Family Medicine | Admitting: Family Medicine

## 2023-02-11 ENCOUNTER — Other Ambulatory Visit: Payer: Self-pay | Admitting: Family Medicine

## 2023-02-11 DIAGNOSIS — E559 Vitamin D deficiency, unspecified: Secondary | ICD-10-CM

## 2023-02-11 DIAGNOSIS — E7841 Elevated Lipoprotein(a): Secondary | ICD-10-CM | POA: Insufficient documentation

## 2023-02-11 DIAGNOSIS — Z780 Asymptomatic menopausal state: Secondary | ICD-10-CM

## 2023-02-11 LAB — LIPID PANEL
Chol/HDL Ratio: 2.3 {ratio} (ref 0.0–4.4)
Cholesterol, Total: 212 mg/dL — ABNORMAL HIGH (ref 100–199)
HDL: 93 mg/dL (ref >39)
LDL Chol Calc (NIH): 102 mg/dL — ABNORMAL HIGH (ref 0–99)
Triglycerides: 96 mg/dL (ref 0–149)
VLDL Cholesterol Cal: 17 mg/dL (ref 5–40)

## 2023-02-11 LAB — CBC WITH DIFFERENTIAL/PLATELET
Basophils Absolute: 0 10*3/uL (ref 0.0–0.2)
Basos: 1 %
EOS (ABSOLUTE): 0.1 10*3/uL (ref 0.0–0.4)
Eos: 2 %
Hematocrit: 41.5 % (ref 34.0–46.6)
Hemoglobin: 13.5 g/dL (ref 11.1–15.9)
Immature Grans (Abs): 0 10*3/uL (ref 0.0–0.1)
Immature Granulocytes: 0 %
Lymphocytes Absolute: 1.4 10*3/uL (ref 0.7–3.1)
Lymphs: 25 %
MCH: 31 pg (ref 26.6–33.0)
MCHC: 32.5 g/dL (ref 31.5–35.7)
MCV: 95 fL (ref 79–97)
Monocytes Absolute: 0.4 10*3/uL (ref 0.1–0.9)
Monocytes: 7 %
Neutrophils Absolute: 3.6 10*3/uL (ref 1.4–7.0)
Neutrophils: 65 %
Platelets: 192 10*3/uL (ref 150–450)
RBC: 4.36 x10E6/uL (ref 3.77–5.28)
RDW: 12.1 % (ref 11.7–15.4)
WBC: 5.6 10*3/uL (ref 3.4–10.8)

## 2023-02-11 LAB — COMPREHENSIVE METABOLIC PANEL
ALT: 29 [IU]/L (ref 0–32)
AST: 21 [IU]/L (ref 0–40)
Albumin: 4.7 g/dL (ref 3.8–4.9)
Alkaline Phosphatase: 108 [IU]/L (ref 44–121)
BUN/Creatinine Ratio: 28 — ABNORMAL HIGH (ref 9–23)
BUN: 17 mg/dL (ref 6–24)
Bilirubin Total: 0.3 mg/dL (ref 0.0–1.2)
CO2: 25 mmol/L (ref 20–29)
Calcium: 9.8 mg/dL (ref 8.7–10.2)
Chloride: 101 mmol/L (ref 96–106)
Creatinine, Ser: 0.61 mg/dL (ref 0.57–1.00)
Globulin, Total: 1.8 g/dL (ref 1.5–4.5)
Glucose: 88 mg/dL (ref 70–99)
Potassium: 4.3 mmol/L (ref 3.5–5.2)
Sodium: 141 mmol/L (ref 134–144)
Total Protein: 6.5 g/dL (ref 6.0–8.5)
eGFR: 104 mL/min/{1.73_m2} (ref >59)

## 2023-02-11 LAB — LIPOPROTEIN A (LPA): Lipoprotein (a): 105.1 nmol/L — ABNORMAL HIGH (ref <75.0)

## 2023-02-11 LAB — TSH: TSH: 1.12 u[IU]/mL (ref 0.450–4.500)

## 2023-02-11 LAB — HEMOGLOBIN A1C
Est. average glucose Bld gHb Est-mCnc: 117 mg/dL
Hgb A1c MFr Bld: 5.7 % — ABNORMAL HIGH (ref 4.8–5.6)

## 2023-02-11 LAB — VITAMIN D 25 HYDROXY (VIT D DEFICIENCY, FRACTURES): Vit D, 25-Hydroxy: 46.3 ng/mL (ref 30.0–100.0)

## 2023-02-11 MED ORDER — ATORVASTATIN CALCIUM 40 MG PO TABS: 40.0000 mg | ORAL_TABLET | Freq: Every day | ORAL | 3 refills | Status: DC

## 2023-02-25 ENCOUNTER — Encounter: Payer: Self-pay | Admitting: Family Medicine

## 2023-02-25 DIAGNOSIS — E7841 Elevated Lipoprotein(a): Secondary | ICD-10-CM

## 2023-03-15 NOTE — Progress Notes (Signed)
 PCP: Emilio Kelly DASEN, FNP (Inactive)   Chief Complaint  Patient presents with   Gynecologic Exam    No concerns    HPI:      [name] is a 58 y.o. [id number] whose LMP was Patient's last menstrual period was 01/14/2020 (exact date)., presents today for her annual examination.  Her menses are absent due to total Laparoscopic Hysterectomy, bilateral salpingectomy, cystoscopy for menorrhagia with irregular cycle 4/22 with Dr. Leonce. No PMB. Occas tolerable VS sx. Was having persistent SUI sx even after surgery but sx improved with pelvic floor exercises.   Sex activity: single partner, contraception - status post hysterectomy. She does have vaginal dryness improved with lubricants. Was given Rx for vag ERT but concerned about side effects on box. Doing fine without it.   Last Pap: 01/24/20  Results were: no abnormalities /neg HPV DNA. Hx of abn pap with cryotx vs LEEP in 20s, neg paps since.   Last mammogram: 02/05/23 with PCP Results were: normal--routine follow-up in 12 months There is no FH of breast cancer. There is no FH of ovarian cancer. The patient does do self-breast exams.  Colonoscopy: 2016 due to IBS,  Repeat due after 10 years per pt.   Tobacco use: The patient denies current or previous tobacco use. Alcohol use: none No drug use Exercise: moderately active  She does get adequate calcium  and Vitamin D  in her diet.  Labs with PCP.   Patient Active Problem List   Diagnosis Date Noted   Family history of ASCVD 02/09/2023   Family history of diabetes mellitus 02/09/2023   Need for immunization against influenza 02/04/2022   Annual physical exam 02/04/2022   Encounter for screening for HIV 02/04/2022   Encounter for hepatitis C screening test for low risk patient 02/04/2022   Depression, recurrent (HCC) 02/04/2022   Elevated LDL cholesterol level 09/10/2021   Airway hyperreactivity 12/11/2014   Avitaminosis D 09/17/2008    Past Surgical History:   Procedure Laterality Date   ABDOMINAL HYSTERECTOMY  04/22   ANTERIOR CERVICAL DECOMP/DISCECTOMY FUSION  2005   C4-6   CESAREAN SECTION  1990   colonoscopy with polypectomy  06/2007; 2016   abd pain and chronic coinstipation   CYSTOSCOPY N/A 05/21/2020   Procedure: CYSTOSCOPY;  Surgeon: Leonce Garnette BIRCH, MD;  Location: [facility];  Service: Gynecology;  Laterality: N/A;   DIAGNOSTIC LAPAROSCOPY     normal pelvis   ESOPHAGOGASTRODUODENOSCOPY  06/2007; 2016   gastric polyps   HYSTEROSCOPY WITH D & C N/A 10/13/2018   Procedure: DILATATION AND CURETTAGE /HYSTEROSCOPY;  Surgeon: Arloa Lamar SQUIBB, MD;  Location: [facility];  Service: Gynecology;  Laterality: N/A;   LEEP  12/06/2008   cx polyp removal-path was HPV changes - CAK   TOTAL LAPAROSCOPIC HYSTERECTOMY WITH SALPINGECTOMY Bilateral 05/21/2020   Procedure: TOTAL LAPAROSCOPIC HYSTERECTOMY WITH SALPINGECTOMY, LYSIS OF ADHESIONS;  Surgeon: Leonce Garnette BIRCH, MD;  Location: [facility];  Service: Gynecology;  Laterality: Bilateral;    Family History  Problem Relation Age of Onset   COPD Mother    Heart disease Mother    Stroke Mother    Hypertension Mother    Lung cancer Mother        spread to liver and brain.   Diabetes Father    Hypertension Father    Benign prostatic hyperplasia Father    Bladder Cancer Father    Hyperlipidemia Father    Dementia Father        Lewy Body  Dementia   Asthma Father    Melanoma Father    Diabetes Sister    Hyperlipidemia Brother    Hypertension Brother    Cancer Maternal Grandmother 32       pancreatic   Prostate cancer Neg Hx    Kidney disease Neg Hx    Breast cancer Neg Hx     Social History   Socioeconomic History   Marital status: Married    Spouse name: Not on file   Number of children: 2   Years of education: H/S   Highest education level: 12th grade  Occupational History   Occupation: Nurse, Adult: [employer]  Tobacco Use   Smoking status: Never   Smokeless tobacco:  Never  Vaping Use   Vaping status: Never Used  Substance and Sexual Activity   Alcohol use: No    Alcohol/week: 0.0 standard drinks of alcohol   Drug use: No   Sexual activity: Yes    Partners: Male    Birth control/protection: Surgical    Comment: Hysterectomy  Other Topics Concern   Not on file  Social History Narrative   [name]'s daughter Vernell has 3 children -ages 37,2,and 105(2018). Her son is a Event Organiser.   Social Drivers of Corporate Investment Banker Strain: Low Risk  (02/08/2023)   Overall Financial Resource Strain (CARDIA)    Difficulty of Paying Living Expenses: Not hard at all  Food Insecurity: No Food Insecurity (02/08/2023)   Hunger Vital Sign    Worried About Running Out of Food in the Last Year: Never true    Ran Out of Food in the Last Year: Never true  Transportation Needs: No Transportation Needs (02/08/2023)   PRAPARE - Administrator, Civil Service (Medical): No    Lack of Transportation (Non-Medical): No  Physical Activity: Insufficiently Active (02/08/2023)   Exercise Vital Sign    Days of Exercise per Week: 4 days    Minutes of Exercise per Session: 20 min  Stress: No Stress Concern Present (02/08/2023)   [employer] of Occupational Health - Occupational Stress Questionnaire    Feeling of Stress : Only a little  Social Connections: Socially Integrated (02/08/2023)   Social Connection and Isolation Panel [NHANES]    Frequency of Communication with Friends and Family: More than three times a week    Frequency of Social Gatherings with Friends and Family: Three times a week    Attends Religious Services: More than 4 times per year    Active Member of Clubs or Organizations: Yes    Attends Banker Meetings: More than 4 times per year    Marital Status: Married  Catering Manager Violence: Not At Risk (01/11/2017)   Humiliation, Afraid, Rape, and Kick questionnaire    Fear of Current or Ex-Partner: No     Emotionally Abused: No    Physically Abused: No    Sexually Abused: No     Current Outpatient Medications:    albuterol  (VENTOLIN  HFA) 108 (90 Base) MCG/ACT inhaler, Inhale 2 puffs into the lungs every 6 (six) hours as needed for wheezing or shortness of breath., Disp: 6.7 g, Rfl: 0   atorvastatin  (LIPITOR) 40 MG tablet, Take 1 tablet (40 mg total) by mouth daily., Disp: 90 tablet, Rfl: 3   B-COMPLEX-C PO, Take by mouth., Disp: , Rfl:    buPROPion  (WELLBUTRIN  XL) 150 MG 24 hr tablet, Take 1 tablet (150 mg total) by mouth daily., Disp: 90 tablet, Rfl:  3   Cholecalciferol (VITAMIN D3) 250 MCG (10000 UT) TABS, Take 10,000 Units by mouth at bedtime., Disp: , Rfl:    fexofenadine  (ALLEGRA ) 180 MG tablet, Take 1 tablet (180 mg total) by mouth every evening., Disp: 90 tablet, Rfl: 3   fluticasone  (FLONASE ) 50 MCG/ACT nasal spray, Place 2 sprays into both nostrils daily as needed for allergies., Disp: 48 g, Rfl: 3   Magnesium 250 MG TABS, , Disp: , Rfl:    montelukast  (SINGULAIR ) 10 MG tablet, Take 1 tablet (10 mg total) by mouth at bedtime., Disp: 90 tablet, Rfl: 3   Multiple Vitamin (MULTIVITAMIN WITH MINERALS) TABS tablet, Take 1 tablet by mouth at bedtime., Disp: , Rfl:    pantoprazole  (PROTONIX ) 40 MG tablet, Take 1 tablet (40 mg total) by mouth 2 (two) times daily., Disp: 180 tablet, Rfl: 3   senna-docusate (SENOKOT-S) 8.6-50 MG per tablet, Take 2 tablets by mouth 2 (two) times daily. , Disp: , Rfl:      ROS:  Review of Systems  Constitutional:  Negative for fatigue, fever and unexpected weight change.  Respiratory:  Negative for cough, shortness of breath and wheezing.   Cardiovascular:  Negative for chest pain, palpitations and leg swelling.  Gastrointestinal:  Negative for blood in stool, constipation, diarrhea, nausea and vomiting.  Endocrine: Negative for cold intolerance, heat intolerance and polyuria.  Genitourinary:  Negative for dyspareunia, dysuria, flank pain, frequency,  genital sores, hematuria, menstrual problem, pelvic pain, urgency, vaginal bleeding, vaginal discharge and vaginal pain.  Musculoskeletal:  Negative for back pain, joint swelling and myalgias.  Skin:  Negative for rash.  Neurological:  Negative for dizziness, syncope, light-headedness, numbness and headaches.  Hematological:  Negative for adenopathy.  Psychiatric/Behavioral:  Negative for agitation, confusion, sleep disturbance and suicidal ideas. The patient is not nervous/anxious.    BREAST: No symptoms    Objective: BP 116/69   Pulse 64   Ht 5' 6 (1.676 m)   Wt 128 lb (58.1 kg)   LMP 01/14/2020 (Exact Date)   BMI 20.66 kg/m    Physical Exam Constitutional:      Appearance: She is well-developed.  Genitourinary:     Vulva normal.     Genitourinary Comments: UTERUS/CX SURG REM     Right Labia: No rash, tenderness or lesions.    Left Labia: No tenderness, lesions or rash.    Vaginal cuff intact.    No vaginal discharge, erythema or tenderness.     Moderate vaginal atrophy present.     Right Adnexa: not tender and no mass present.    Left Adnexa: not tender and no mass present.    Cervix is absent.     Uterus is absent.  Breasts:    Right: No mass, nipple discharge, skin change or tenderness.     Left: No mass, nipple discharge, skin change or tenderness.  Neck:     Thyroid: No thyromegaly.  Cardiovascular:     Rate and Rhythm: Normal rate and regular rhythm.     Heart sounds: Normal heart sounds. No murmur heard. Pulmonary:     Effort: Pulmonary effort is normal.     Breath sounds: Normal breath sounds.  Abdominal:     Palpations: Abdomen is soft.     Tenderness: There is no abdominal tenderness. There is no guarding.  Musculoskeletal:        General: Normal range of motion.     Cervical back: Normal range of motion.  Neurological:     General: No  focal deficit present.     Mental Status: She is alert and oriented to person, place, and time.     Cranial  Nerves: No cranial nerve deficit.  Skin:    General: Skin is warm and dry.  Psychiatric:        Mood and Affect: Mood normal.        Behavior: Behavior normal.        Thought Content: Thought content normal.        Judgment: Judgment normal.  Vitals reviewed.    Assessment/Plan:  Encounter for annual routine gynecological examination  Encounter for screening mammogram for malignant neoplasm of breast; pt current on mammo  SUI (stress urinary incontinence, female)--cont pelvic floor exercises, can add vag ERT prn due to mod vag atrophy.          GYN counsel breast self exam, mammography screening, menopause, adequate intake of calcium  and vitamin D , diet and exercise    F/U  Return in about 1 year (around 03/15/2024).  Dilon Lank B. Aariana Shankland, PA-C 03/16/2023 11:04 AM

## 2023-03-16 ENCOUNTER — Ambulatory Visit (INDEPENDENT_AMBULATORY_CARE_PROVIDER_SITE_OTHER): Payer: 59 | Admitting: Obstetrics and Gynecology

## 2023-03-16 ENCOUNTER — Encounter: Payer: Self-pay | Admitting: Obstetrics and Gynecology

## 2023-03-16 VITALS — BP 116/69 | HR 64 | Ht 66.0 in | Wt 128.0 lb

## 2023-03-16 DIAGNOSIS — Z01419 Encounter for gynecological examination (general) (routine) without abnormal findings: Secondary | ICD-10-CM

## 2023-03-16 DIAGNOSIS — N393 Stress incontinence (female) (male): Secondary | ICD-10-CM

## 2023-03-16 DIAGNOSIS — Z1231 Encounter for screening mammogram for malignant neoplasm of breast: Secondary | ICD-10-CM

## 2023-03-16 DIAGNOSIS — Z1211 Encounter for screening for malignant neoplasm of colon: Secondary | ICD-10-CM

## 2023-03-16 NOTE — Patient Instructions (Signed)
 I value your feedback and you entrusting [place] with your care. If you get a  patient survey, I would appreciate you taking the time to let us know about your experience today. Thank you! ? ? ?

## 2023-04-30 ENCOUNTER — Ambulatory Visit: Payer: 59 | Attending: Cardiology | Admitting: Cardiology

## 2023-04-30 ENCOUNTER — Encounter: Payer: Self-pay | Admitting: Cardiology

## 2023-04-30 VITALS — BP 124/76 | HR 58 | Ht 66.0 in | Wt 129.6 lb

## 2023-04-30 DIAGNOSIS — E7800 Pure hypercholesterolemia, unspecified: Secondary | ICD-10-CM | POA: Diagnosis not present

## 2023-04-30 DIAGNOSIS — I2510 Atherosclerotic heart disease of native coronary artery without angina pectoris: Secondary | ICD-10-CM

## 2023-04-30 NOTE — Progress Notes (Signed)
 Cardiology Office Note:    Date:  04/30/2023   ID:  Janice Donovan, DOB 1965/03/02, MRN [id number]  PCP:  Jacky Kindle, FNP   [facility] Providers Cardiologist:  Debbe Odea, MD     Referring MD: Erasmo Downer, MD   No chief complaint on file.  Janice Donovan is a 58 y.o. female who is being seen today for the evaluation of hyperlipidemia at the request of Erasmo Downer, MD.   History of Present Illness:    Janice Donovan is a 58 y.o. female with a hx of CAD (LAD, RCA calcifications ), hyperlipidemia, elevated lipoprotein a presenting due to hyperlipidemia, coronary calcifications.  Patient has a history of hyperlipidemia, cholesterol levels stayed elevated despite eating healthy and losing weight.  Lipoprotein a was elevated.  Mother had a coronary stent placed in her 5s.  Both parents have history of hyperlipidemia.  Lab work revealed elevated lipoprotein a.  Coronary calcium score showed elevated levels.  Started on aspirin, Lipitor 40 mg daily.  Patient tolerating medications with no adverse effects.  Plans to follow-up with PCP in about 3 months for repeat blood work.  She otherwise denies chest pain, shortness of breath.  Prior studies/testing. CT cardiac scoring 02/2023 calcium score 114, 94th percentile.  Past Medical History:  Diagnosis Date   Adrenal nodule (HCC)    Allergic rhinitis    Asthma    Cervical polyp    Constipation    Contraception management 10/16/2014   Generalized abdominal pain    GERD (gastroesophageal reflux disease)    History of mammogram 2015;01/02/15; 01/06/16   birads 1; birads 1; neg   History of Papanicolaou smear of cervix 2011;03/10/15   neg; neg   IBS (irritable bowel syndrome)    Mood disorder (HCC)    Obesity    Plantar fasciitis of left foot 09/29/2012   Pneumonia    2010   PONV (postoperative nausea and vomiting)    had nausea one time, hard to wake up many years ago   Renal lesion     Vaginal birth after cesarean 1993    Past Surgical History:  Procedure Laterality Date   ABDOMINAL HYSTERECTOMY  04/22   ANTERIOR CERVICAL DECOMP/DISCECTOMY FUSION  2005   C4-6   CESAREAN SECTION  1990   colonoscopy with polypectomy  06/2007; 2016   abd pain and chronic coinstipation   CYSTOSCOPY N/A 05/21/2020   Procedure: CYSTOSCOPY;  Surgeon: Conard Novak, MD;  Location: [facility];  Service: Gynecology;  Laterality: N/A;   DIAGNOSTIC LAPAROSCOPY     normal pelvis   ESOPHAGOGASTRODUODENOSCOPY  06/2007; 2016   gastric polyps   HYSTEROSCOPY WITH D & C N/A 10/13/2018   Procedure: DILATATION AND CURETTAGE /HYSTEROSCOPY;  Surgeon: Nadara Mustard, MD;  Location: [facility];  Service: Gynecology;  Laterality: N/A;   LEEP  12/06/2008   cx polyp removal-path was HPV changes - CAK   TOTAL LAPAROSCOPIC HYSTERECTOMY WITH SALPINGECTOMY Bilateral 05/21/2020   Procedure: TOTAL LAPAROSCOPIC HYSTERECTOMY WITH SALPINGECTOMY, LYSIS OF ADHESIONS;  Surgeon: Conard Novak, MD;  Location: [facility];  Service: Gynecology;  Laterality: Bilateral;    Current Medications: Current Meds  Medication Sig   albuterol (VENTOLIN HFA) 108 (90 Base) MCG/ACT inhaler Inhale 2 puffs into the lungs every 6 (six) hours as needed for wheezing or shortness of breath.   Aspirin 81 MG CAPS Take 81 mg by mouth daily.   atorvastatin (LIPITOR) 40 MG tablet Take 1 tablet (  40 mg total) by mouth daily.   B-COMPLEX-C PO Take by mouth.   buPROPion (WELLBUTRIN XL) 150 MG 24 hr tablet Take 1 tablet (150 mg total) by mouth daily.   Cholecalciferol (VITAMIN D3) 250 MCG (10000 UT) TABS Take 10,000 Units by mouth at bedtime.   fexofenadine (ALLEGRA) 180 MG tablet Take 1 tablet (180 mg total) by mouth every evening.   fluticasone (FLONASE) 50 MCG/ACT nasal spray Place 2 sprays into both nostrils daily as needed for allergies.   Magnesium 250 MG TABS    montelukast (SINGULAIR) 10 MG tablet Take 1 tablet (10 mg total) by mouth at  bedtime.   Multiple Vitamin (MULTIVITAMIN WITH MINERALS) TABS tablet Take 1 tablet by mouth at bedtime.   pantoprazole (PROTONIX) 40 MG tablet Take 1 tablet (40 mg total) by mouth 2 (two) times daily.   senna-docusate (SENOKOT-S) 8.6-50 MG per tablet Take 2 tablets by mouth 2 (two) times daily.      Allergies:   Erythromycin and Bee venom   Social History   Socioeconomic History   Marital status: Married    Spouse name: Not on file   Number of children: 2   Years of education: H/S   Highest education level: 12th grade  Occupational History   Occupation: Nurse, adult: [employer]  Tobacco Use   Smoking status: Never   Smokeless tobacco: Never  Vaping Use   Vaping status: Never Used  Substance and Sexual Activity   Alcohol use: No    Alcohol/week: 0.0 standard drinks of alcohol   Drug use: No   Sexual activity: Yes    Partners: Male    Birth control/protection: Surgical    Comment: Hysterectomy  Other Topics Concern   Not on file  Social History Narrative   Kalii's daughter Fleet Contras has 3 children -ages 35,2,and 77(2018). Her son is a Event organiser.   Social Drivers of Corporate investment banker Strain: Low Risk  (02/08/2023)   Overall Financial Resource Strain (CARDIA)    Difficulty of Paying Living Expenses: Not hard at all  Food Insecurity: No Food Insecurity (02/08/2023)   Hunger Vital Sign    Worried About Running Out of Food in the Last Year: Never true    Ran Out of Food in the Last Year: Never true  Transportation Needs: No Transportation Needs (02/08/2023)   PRAPARE - Administrator, Civil Service (Medical): No    Lack of Transportation (Non-Medical): No  Physical Activity: Insufficiently Active (02/08/2023)   Exercise Vital Sign    Days of Exercise per Week: 4 days    Minutes of Exercise per Session: 20 min  Stress: No Stress Concern Present (02/08/2023)   [employer] of Occupational Health - Occupational Stress  Questionnaire    Feeling of Stress : Only a little  Social Connections: Socially Integrated (02/08/2023)   Social Connection and Isolation Panel [NHANES]    Frequency of Communication with Friends and Family: More than three times a week    Frequency of Social Gatherings with Friends and Family: Three times a week    Attends Religious Services: More than 4 times per year    Active Member of Clubs or Organizations: Yes    Attends Engineer, structural: More than 4 times per year    Marital Status: Married     Family History: The patient's family history includes Asthma in her father; Benign prostatic hyperplasia in her father; Bladder Cancer in her father; COPD  in her mother; Cancer (age of onset: 65) in her maternal grandmother; Dementia in her father; Diabetes in her father and sister; Heart disease in her mother; Hyperlipidemia in her brother and father; Hypertension in her brother, father, and mother; Lung cancer in her mother; Melanoma in her father; Stroke in her mother. There is no history of Prostate cancer, Kidney disease, or Breast cancer.  ROS:   Please see the history of present illness.     All other systems reviewed and are negative.  EKGs/Labs/Other Studies Reviewed:    The following studies were reviewed today:  EKG Interpretation Date/Time:  Friday April 30 2023 09:24:11 EDT Ventricular Rate:  58 PR Interval:  130 QRS Duration:  84 QT Interval:  444 QTC Calculation: 435 R Axis:   53  Text Interpretation: Sinus bradycardia No previous ECGs available Confirmed by Debbe Odea ([id number]) on 04/30/2023 9:45:51 AM    Recent Labs: 02/09/2023: ALT 29; BUN 17; Creatinine, Ser 0.61; Hemoglobin 13.5; Platelets 192; Potassium 4.3; Sodium 141; TSH 1.120  Recent Lipid Panel    Component Value Date/Time   CHOL 212 (H) 02/09/2023 0924   TRIG 96 02/09/2023 0924   HDL 93 02/09/2023 0924   CHOLHDL 2.3 02/09/2023 0924   LDLCALC 102 (H) 02/09/2023 0924     Risk  Assessment/Calculations:             Physical Exam:    VS:  BP 124/76   Pulse (!) 58   Ht 5\' 6"  (1.676 m)   Wt 129 lb 9.6 oz (58.8 kg)   LMP 01/14/2020 (Exact Date)   SpO2 95%   BMI 20.92 kg/m     Wt Readings from Last 3 Encounters:  04/30/23 129 lb 9.6 oz (58.8 kg)  03/16/23 128 lb (58.1 kg)  02/09/23 130 lb (59 kg)     GEN:  Well nourished, well developed in no acute distress HEENT: Normal NECK: No JVD; No carotid bruits CARDIAC: RRR, no murmurs, rubs, gallops RESPIRATORY:  Clear to auscultation without rales, wheezing or rhonchi  ABDOMEN: Soft, non-tender, non-distended MUSCULOSKELETAL:  No edema; No deformity  SKIN: Warm and dry NEUROLOGIC:  Alert and oriented x 3 PSYCHIATRIC:  Normal affect   ASSESSMENT:    1. Coronary artery disease involving native coronary artery of native heart, unspecified whether angina present   2. Pure hypercholesterolemia    PLAN:    In order of problems listed above:  CAD, LAD, RCA calcifications, calcium score 114, 94th percentile.  Agree with aspirin 81 mg daily, Lipitor 40 mg daily.  Obtain echocardiogram.  Patient otherwise asymptomatic. Hyperlipidemia, elevated lipoprotein a.  Goal LDL less than 70.  Continue Lipitor 40 mg daily.  Plans to repeat lipid profile with PCP in 3 months.  Titrate statin if LDL not at goal.  Follow-up after echo      Medication Adjustments/Labs and Tests Ordered: Current medicines are reviewed at length with the patient today.  Concerns regarding medicines are outlined above.  Orders Placed This Encounter  Procedures   EKG 12-Lead   ECHOCARDIOGRAM COMPLETE   No orders of the defined types were placed in this encounter.   Patient Instructions  Medication Instructions:  No changes *If you need a refill on your cardiac medications before your next appointment, please call your pharmacy*  Lab Work: No labs  Testing/Procedures: Your physician has requested that you have an echocardiogram.  Echocardiography is a painless test that uses sound waves to create images of your heart. It provides your  doctor with information about the size and shape of your heart and how well your heart's chambers and valves are working. This procedure takes approximately one hour. There are no restrictions for this procedure. Please do NOT wear cologne, perfume, aftershave, or lotions (deodorant is allowed). Please arrive 15 minutes prior to your appointment time.  Please note: We ask at that you not bring children with you during ultrasound (echo/ vascular) testing. Due to room size and safety concerns, children are not allowed in the ultrasound rooms during exams. Our front office staff cannot provide observation of children in our lobby area while testing is being conducted. An adult accompanying a patient to their appointment will only be allowed in the ultrasound room at the discretion of the ultrasound technician under special circumstances. We apologize for any inconvenience.  Follow-Up: At [facility], you and your health needs are our priority.  As part of our continuing mission to provide you with exceptional heart care, we have created designated Provider Care Teams.  These Care Teams include your primary Cardiologist (physician) and Advanced Practice Providers (APPs -  Physician Assistants and Nurse Practitioners) who all work together to provide you with the care you need, when you need it.  We recommend signing up for the patient portal called "MyChart".  Sign up information is provided on this After Visit Summary.  MyChart is used to connect with patients for Virtual Visits (Telemedicine).  Patients are able to view lab/test results, encounter notes, upcoming appointments, etc.  Non-urgent messages can be sent to your provider as well.   To learn more about what you can do with MyChart, go to [email or web address].    Your next appointment:   2-3 month(s)  Provider:   Debbe Odea, MD     Signed, Debbe Odea, MD  04/30/2023 12:59 PM    [facility]

## 2023-04-30 NOTE — Patient Instructions (Signed)
 Medication Instructions:  No changes *If you need a refill on your cardiac medications before your next appointment, please call your pharmacy*  Lab Work: No labs  Testing/Procedures: Your physician has requested that you have an echocardiogram. Echocardiography is a painless test that uses sound waves to create images of your heart. It provides your doctor with information about the size and shape of your heart and how well your heart's chambers and valves are working. This procedure takes approximately one hour. There are no restrictions for this procedure. Please do NOT wear cologne, perfume, aftershave, or lotions (deodorant is allowed). Please arrive 15 minutes prior to your appointment time.  Please note: We ask at that you not bring children with you during ultrasound (echo/ vascular) testing. Due to room size and safety concerns, children are not allowed in the ultrasound rooms during exams. Our front office staff cannot provide observation of children in our lobby area while testing is being conducted. An adult accompanying a patient to their appointment will only be allowed in the ultrasound room at the discretion of the ultrasound technician under special circumstances. We apologize for any inconvenience.  Follow-Up: At [facility], you and your health needs are our priority.  As part of our continuing mission to provide you with exceptional heart care, we have created designated Provider Care Teams.  These Care Teams include your primary Cardiologist (physician) and Advanced Practice Providers (APPs -  Physician Assistants and Nurse Practitioners) who all work together to provide you with the care you need, when you need it.  We recommend signing up for the patient portal called "MyChart".  Sign up information is provided on this After Visit Summary.  MyChart is used to connect with patients for Virtual Visits (Telemedicine).  Patients are able to view lab/test results,  encounter notes, upcoming appointments, etc.  Non-urgent messages can be sent to your provider as well.   To learn more about what you can do with MyChart, go to [email or web address].    Your next appointment:   2-3 month(s)  Provider:   Debbe Odea, MD

## 2023-05-27 ENCOUNTER — Ambulatory Visit: Attending: Cardiology

## 2023-05-27 DIAGNOSIS — I2510 Atherosclerotic heart disease of native coronary artery without angina pectoris: Secondary | ICD-10-CM

## 2023-05-27 DIAGNOSIS — E7800 Pure hypercholesterolemia, unspecified: Secondary | ICD-10-CM

## 2023-05-27 LAB — ECHOCARDIOGRAM COMPLETE
AR max vel: 2.15 cm2
AV Area VTI: 1.86 cm2
AV Area mean vel: 1.94 cm2
AV Mean grad: 4 mmHg
AV Peak grad: 7.5 mmHg
Ao pk vel: 1.37 m/s
Area-P 1/2: 3.48 cm2
S' Lateral: 2.59 cm

## 2023-06-16 ENCOUNTER — Telehealth: Payer: Self-pay

## 2023-06-16 ENCOUNTER — Other Ambulatory Visit (HOSPITAL_COMMUNITY): Payer: Self-pay

## 2023-06-16 NOTE — Telephone Encounter (Signed)
 Pharmacy Patient Advocate Encounter   Received notification from Onbase that prior authorization for Pantoprazole  Sodium 40MG  dr tablets is required/requested.   Insurance verification completed.   The patient is insured through [facility] .   Per test claim: PA required; PA submitted to above mentioned insurance via CoverMyMeds Key/confirmation #/EOC [id number] Status is pending

## 2023-06-17 ENCOUNTER — Other Ambulatory Visit (HOSPITAL_COMMUNITY): Payer: Self-pay

## 2023-06-17 NOTE — Telephone Encounter (Signed)
 Pharmacy Patient Advocate Encounter  Received notification from [employer] that Prior Authorization for Pantoprazole  Sodium 40MG  dr tablets has been APPROVED from 06/16/23 to 06/15/24   PA #/Case ID/Reference #: [id number]

## 2023-07-22 ENCOUNTER — Ambulatory Visit: Attending: Cardiology | Admitting: Cardiology

## 2023-07-22 VITALS — BP 128/82 | HR 58 | Ht 66.0 in | Wt 127.0 lb

## 2023-07-22 DIAGNOSIS — E7800 Pure hypercholesterolemia, unspecified: Secondary | ICD-10-CM

## 2023-07-22 DIAGNOSIS — I340 Nonrheumatic mitral (valve) insufficiency: Secondary | ICD-10-CM

## 2023-07-22 DIAGNOSIS — I2510 Atherosclerotic heart disease of native coronary artery without angina pectoris: Secondary | ICD-10-CM

## 2023-07-22 NOTE — Patient Instructions (Signed)
 Medication Instructions:  Your physician recommends that you continue on your current medications as directed. Please refer to the Current Medication list given to you today.   *If you need a refill on your cardiac medications before your next appointment, please call your pharmacy*  Lab Work: No labs ordered today  If you have labs (blood work) drawn today and your tests are completely normal, you will receive your results only by: MyChart Message (if you have MyChart) OR A paper copy in the mail If you have any lab test that is abnormal or we need to change your treatment, we will call you to review the results.  Testing/Procedures: Your physician has requested that you have an echocardiogram in 1 year. Echocardiography is a painless test that uses sound waves to create images of your heart. It provides your doctor with information about the size and shape of your heart and how well your heart's chambers and valves are working.   You may receive an ultrasound enhancing agent through an IV if needed to better visualize your heart during the echo. This procedure takes approximately one hour.  There are no restrictions for this procedure.  This will take place at [address] [facility] [address] [facility]) [address], [place] [address]  Please note: We ask at that you not bring children with you during ultrasound (echo/ vascular) testing. Due to room size and safety concerns, children are not allowed in the ultrasound rooms during exams. Our front office staff cannot provide observation of children in our lobby area while testing is being conducted. An adult accompanying a patient to their appointment will only be allowed in the ultrasound room at the discretion of the ultrasound technician under special circumstances. We apologize for any inconvenience.   Follow-Up: At [facility], you and your health needs are our priority.  As part of our continuing mission to provide you with exceptional  heart care, our providers are all part of one team.  This team includes your primary Cardiologist (physician) and Advanced Practice Providers or APPs (Physician Assistants and Nurse Practitioners) who all work together to provide you with the care you need, when you need it.  Your next appointment:   1 year(s)  Provider:   You may see Constancia Delton, MD or one of the following Advanced Practice Providers on your designated Care Team:   Laneta Pintos, NP Gildardo Labrador, PA-C Varney Gentleman, PA-C Cadence [place], PA-C Ronald Cockayne, NP Morey Ar, NP    We recommend signing up for the patient portal called MyChart.  Sign up information is provided on this After Visit Summary.  MyChart is used to connect with patients for Virtual Visits (Telemedicine).  Patients are able to view lab/test results, encounter notes, upcoming appointments, etc.  Non-urgent messages can be sent to your provider as well.   To learn more about what you can do with MyChart, go to [email or web address].

## 2023-07-22 NOTE — Progress Notes (Signed)
 Cardiology Office Note:    Date:  07/22/2023   ID:  VERNIE VINCIGUERRA, DOB Jul 04, 1965, MRN [id number]  PCP:  Normie Becton, FNP   [facility] Providers Cardiologist:  Constancia Delton, MD     Referring MD: Normie Becton, FNP   Chief Complaint  Patient presents with   Follow-up    History of Present Illness:    Janice Donovan is a 58 y.o. female with a hx of CAD (LAD, RCA calcifications ), hyperlipidemia, elevated lipoprotein a presenting for follow-up.  She was last seen due to hyperlipidemia, coronary calcifications.  Denies chest pain or shortness of breath.  Echocardiogram was obtained to evaluate any significant structural abnormalities.  Endorses fatigue, states not sleeping well.  Compliant with aspirin, Lipitor as prescribed.  Plans for obtaining repeat lipid panel next month with PCP.  Prior studies/testing. CT cardiac scoring 02/2023 calcium  score 114, 94th percentile.  Past Medical History:  Diagnosis Date   Adrenal nodule (HCC)    Allergic rhinitis    Asthma    Cervical polyp    Constipation    Contraception management 10/16/2014   Generalized abdominal pain    GERD (gastroesophageal reflux disease)    History of mammogram 2015;01/02/15; 01/06/16   birads 1; birads 1; neg   History of Papanicolaou smear of cervix 2011;03/10/15   neg; neg   IBS (irritable bowel syndrome)    Mood disorder (HCC)    Obesity    Plantar fasciitis of left foot 09/29/2012   Pneumonia    2010   PONV (postoperative nausea and vomiting)    had nausea one time, hard to wake up many years ago   Renal lesion    Vaginal birth after cesarean 1993    Past Surgical History:  Procedure Laterality Date   ABDOMINAL HYSTERECTOMY  04/22   ANTERIOR CERVICAL DECOMP/DISCECTOMY FUSION  2005   C4-6   CESAREAN SECTION  1990   colonoscopy with polypectomy  06/2007; 2016   abd pain and chronic coinstipation   CYSTOSCOPY N/A 05/21/2020   Procedure: CYSTOSCOPY;  Surgeon: Kris Pester, MD;  Location: [facility] ORS;  Service: Gynecology;  Laterality: N/A;   DIAGNOSTIC LAPAROSCOPY     normal pelvis   ESOPHAGOGASTRODUODENOSCOPY  06/2007; 2016   gastric polyps   HYSTEROSCOPY WITH D & C N/A 10/13/2018   Procedure: DILATATION AND CURETTAGE /HYSTEROSCOPY;  Surgeon: Alben Alma, MD;  Location: [facility] ORS;  Service: Gynecology;  Laterality: N/A;   LEEP  12/06/2008   cx polyp removal-path was HPV changes - [facility]   TOTAL LAPAROSCOPIC HYSTERECTOMY WITH SALPINGECTOMY Bilateral 05/21/2020   Procedure: TOTAL LAPAROSCOPIC HYSTERECTOMY WITH SALPINGECTOMY, LYSIS OF ADHESIONS;  Surgeon: Kris Pester, MD;  Location: [facility] ORS;  Service: Gynecology;  Laterality: Bilateral;    Current Medications: Current Meds  Medication Sig   albuterol  (VENTOLIN  HFA) 108 (90 Base) MCG/ACT inhaler Inhale 2 puffs into the lungs every 6 (six) hours as needed for wheezing or shortness of breath.   Aspirin 81 MG CAPS Take 81 mg by mouth daily.   atorvastatin  (LIPITOR) 40 MG tablet Take 1 tablet (40 mg total) by mouth daily.   B-COMPLEX-C PO Take by mouth.   buPROPion  (WELLBUTRIN  XL) 150 MG 24 hr tablet Take 1 tablet (150 mg total) by mouth daily.   Cholecalciferol (VITAMIN D3) 250 MCG (10000 UT) TABS Take 10,000 Units by mouth at bedtime.   fexofenadine  (ALLEGRA ) 180 MG tablet Take 1 tablet (180 mg total) by mouth  every evening.   fluticasone  (FLONASE ) 50 MCG/ACT nasal spray Place 2 sprays into both nostrils daily as needed for allergies.   Magnesium 250 MG TABS    montelukast  (SINGULAIR ) 10 MG tablet Take 1 tablet (10 mg total) by mouth at bedtime.   Multiple Vitamin (MULTIVITAMIN WITH MINERALS) TABS tablet Take 1 tablet by mouth at bedtime.   pantoprazole  (PROTONIX ) 40 MG tablet Take 1 tablet (40 mg total) by mouth 2 (two) times daily.   senna-docusate (SENOKOT-S) 8.6-50 MG per tablet Take 2 tablets by mouth 2 (two) times daily.      Allergies:   Erythromycin and Bee venom   Social History    Socioeconomic History   Marital status: Married    Spouse name: Not on file   Number of children: 2   Years of education: H/S   Highest education level: 12th grade  Occupational History   Occupation: Nurse, adult: [employer]  Tobacco Use   Smoking status: Never   Smokeless tobacco: Never  Vaping Use   Vaping status: Never Used  Substance and Sexual Activity   Alcohol use: No    Alcohol/week: 0.0 standard drinks of alcohol   Drug use: No   Sexual activity: Yes    Partners: Male    Birth control/protection: Surgical    Comment: Hysterectomy  Other Topics Concern   Not on file  Social History Narrative   Jonte's daughter Kayleen Party has 3 children -ages 28,2,and 91(2018). Her son is a Event organiser.   Social Drivers of Corporate investment banker Strain: Low Risk  (02/08/2023)   Overall Financial Resource Strain (CARDIA)    Difficulty of Paying Living Expenses: Not hard at all  Food Insecurity: No Food Insecurity (02/08/2023)   Hunger Vital Sign    Worried About Running Out of Food in the Last Year: Never true    Ran Out of Food in the Last Year: Never true  Transportation Needs: No Transportation Needs (02/08/2023)   PRAPARE - Administrator, Civil Service (Medical): No    Lack of Transportation (Non-Medical): No  Physical Activity: Insufficiently Active (02/08/2023)   Exercise Vital Sign    Days of Exercise per Week: 4 days    Minutes of Exercise per Session: 20 min  Stress: No Stress Concern Present (02/08/2023)   [employer] of Occupational Health - Occupational Stress Questionnaire    Feeling of Stress : Only a little  Social Connections: Socially Integrated (02/08/2023)   Social Connection and Isolation Panel    Frequency of Communication with Friends and Family: More than three times a week    Frequency of Social Gatherings with Friends and Family: Three times a week    Attends Religious Services: More than 4 times per year     Active Member of Clubs or Organizations: Yes    Attends Engineer, structural: More than 4 times per year    Marital Status: Married     Family History: The patient's family history includes Asthma in her father; Benign prostatic hyperplasia in her father; Bladder Cancer in her father; COPD in her mother; Cancer (age of onset: 95) in her maternal grandmother; Dementia in her father; Diabetes in her father and sister; Heart disease in her mother; Hyperlipidemia in her brother and father; Hypertension in her brother, father, and mother; Lung cancer in her mother; Melanoma in her father; Stroke in her mother. There is no history of Prostate cancer, Kidney disease, or Breast cancer.  ROS:   Please see the history of present illness.     All other systems reviewed and are negative.  EKGs/Labs/Other Studies Reviewed:    The following studies were reviewed today:       Recent Labs: 02/09/2023: ALT 29; BUN 17; Creatinine, Ser 0.61; Hemoglobin 13.5; Platelets 192; Potassium 4.3; Sodium 141; TSH 1.120  Recent Lipid Panel    Component Value Date/Time   CHOL 212 (H) 02/09/2023 0924   TRIG 96 02/09/2023 0924   HDL 93 02/09/2023 0924   CHOLHDL 2.3 02/09/2023 0924   LDLCALC 102 (H) 02/09/2023 0924     Risk Assessment/Calculations:             Physical Exam:    VS:  BP 128/82 (BP Location: Left Arm, Patient Position: Sitting)   Pulse (!) 58   Ht 5' 6 (1.676 m)   Wt 127 lb (57.6 kg)   LMP 01/14/2020 (Exact Date)   SpO2 100%   BMI 20.50 kg/m     Wt Readings from Last 3 Encounters:  07/22/23 127 lb (57.6 kg)  04/30/23 129 lb 9.6 oz (58.8 kg)  03/16/23 128 lb (58.1 kg)     GEN:  Well nourished, well developed in no acute distress HEENT: Normal NECK: No JVD; No carotid bruits CARDIAC: RRR, no murmurs, rubs, gallops RESPIRATORY:  Clear to auscultation without rales, wheezing or rhonchi  ABDOMEN: Soft, non-tender, non-distended MUSCULOSKELETAL:  No edema; No deformity   SKIN: Warm and dry NEUROLOGIC:  Alert and oriented x 3 PSYCHIATRIC:  Normal affect   ASSESSMENT:    1. Coronary artery disease involving native coronary artery of native heart, unspecified whether angina present   2. Pure hypercholesterolemia   3. Mitral valve insufficiency, unspecified etiology    PLAN:    In order of problems listed above:  CAD, LAD, RCA calcifications, calcium  score 114, 94th percentile.  Continue aspirin 81 mg daily, Lipitor 40 mg daily.  Echo 4/25 EF 60 to 65%.  Patient otherwise asymptomatic, consider ischemic workup if patient develops symptoms. Hyperlipidemia, elevated lipoprotein a.  Goal LDL less than 70.  Continue Lipitor 40 mg daily.  Mild to moderate MR, repeat echo in 1 year to evaluate any progression of valvular disease.  Follow-up in 1 year after repeat echo.      Medication Adjustments/Labs and Tests Ordered: Current medicines are reviewed at length with the patient today.  Concerns regarding medicines are outlined above.  Orders Placed This Encounter  Procedures   ECHOCARDIOGRAM COMPLETE   No orders of the defined types were placed in this encounter.   Patient Instructions  Medication Instructions:  Your physician recommends that you continue on your current medications as directed. Please refer to the Current Medication list given to you today.   *If you need a refill on your cardiac medications before your next appointment, please call your pharmacy*  Lab Work: No labs ordered today  If you have labs (blood work) drawn today and your tests are completely normal, you will receive your results only by: MyChart Message (if you have MyChart) OR A paper copy in the mail If you have any lab test that is abnormal or we need to change your treatment, we will call you to review the results.  Testing/Procedures: Your physician has requested that you have an echocardiogram in 1 year. Echocardiography is a painless test that uses sound waves to  create images of your heart. It provides your doctor with information about the size and shape  of your heart and how well your heart's chambers and valves are working.   You may receive an ultrasound enhancing agent through an IV if needed to better visualize your heart during the echo. This procedure takes approximately one hour.  There are no restrictions for this procedure.  This will take place at [address] [facility]) [address], [place] [address]  Please note: We ask at that you not bring children with you during ultrasound (echo/ vascular) testing. Due to room size and safety concerns, children are not allowed in the ultrasound rooms during exams. Our front office staff cannot provide observation of children in our lobby area while testing is being conducted. An adult accompanying a patient to their appointment will only be allowed in the ultrasound room at the discretion of the ultrasound technician under special circumstances. We apologize for any inconvenience.   Follow-Up: At [facility], you and your health needs are our priority.  As part of our continuing mission to provide you with exceptional heart care, our providers are all part of one team.  This team includes your primary Cardiologist (physician) and Advanced Practice Providers or APPs (Physician Assistants and Nurse Practitioners) who all work together to provide you with the care you need, when you need it.  Your next appointment:   1 year(s)  Provider:   You may see Constancia Delton, MD or one of the following Advanced Practice Providers on your designated Care Team:   Laneta Pintos, NP Gildardo Labrador, PA-C Varney Gentleman, PA-C Cadence [place], PA-C Ronald Cockayne, NP Morey Ar, NP    We recommend signing up for the patient portal called MyChart.  Sign up information is provided on this After Visit Summary.  MyChart is used to connect with patients for Virtual Visits (Telemedicine).   Patients are able to view lab/test results, encounter notes, upcoming appointments, etc.  Non-urgent messages can be sent to your provider as well.   To learn more about what you can do with MyChart, go to [email or web address].          Signed, Constancia Delton, MD  07/22/2023 1:07 PM    [facility]

## 2023-08-05 ENCOUNTER — Ambulatory Visit: Admitting: Cardiology

## 2023-08-23 ENCOUNTER — Ambulatory Visit: Payer: 59 | Admitting: Family Medicine

## 2023-09-21 ENCOUNTER — Ambulatory Visit: Admitting: Family Medicine

## 2023-09-21 ENCOUNTER — Encounter: Payer: Self-pay | Admitting: Family Medicine

## 2023-09-21 VITALS — BP 119/75 | HR 55 | Temp 97.7°F | Ht 66.0 in | Wt 129.1 lb

## 2023-09-21 DIAGNOSIS — E559 Vitamin D deficiency, unspecified: Secondary | ICD-10-CM

## 2023-09-21 DIAGNOSIS — E7800 Pure hypercholesterolemia, unspecified: Secondary | ICD-10-CM

## 2023-09-21 DIAGNOSIS — Z1231 Encounter for screening mammogram for malignant neoplasm of breast: Secondary | ICD-10-CM

## 2023-09-21 DIAGNOSIS — I2510 Atherosclerotic heart disease of native coronary artery without angina pectoris: Secondary | ICD-10-CM

## 2023-09-21 DIAGNOSIS — E7841 Elevated Lipoprotein(a): Secondary | ICD-10-CM | POA: Diagnosis not present

## 2023-09-21 DIAGNOSIS — J4520 Mild intermittent asthma, uncomplicated: Secondary | ICD-10-CM

## 2023-09-21 DIAGNOSIS — F339 Major depressive disorder, recurrent, unspecified: Secondary | ICD-10-CM

## 2023-09-21 DIAGNOSIS — Z789 Other specified health status: Secondary | ICD-10-CM

## 2023-09-21 DIAGNOSIS — M549 Dorsalgia, unspecified: Secondary | ICD-10-CM

## 2023-09-21 DIAGNOSIS — R7309 Other abnormal glucose: Secondary | ICD-10-CM

## 2023-09-21 DIAGNOSIS — Z79899 Other long term (current) drug therapy: Secondary | ICD-10-CM

## 2023-09-21 DIAGNOSIS — K219 Gastro-esophageal reflux disease without esophagitis: Secondary | ICD-10-CM

## 2023-09-21 MED ORDER — BUPROPION HCL ER (XL) 150 MG PO TB24: 150.0000 mg | ORAL_TABLET | Freq: Every day | ORAL | 3 refills | Status: AC

## 2023-09-21 MED ORDER — ATORVASTATIN CALCIUM 40 MG PO TABS
40.0000 mg | ORAL_TABLET | Freq: Every day | ORAL | 3 refills | Status: AC
Start: 2023-09-21 — End: ?

## 2023-09-21 MED ORDER — PANTOPRAZOLE SODIUM 40 MG PO TBEC: 40.0000 mg | DELAYED_RELEASE_TABLET | Freq: Every day | ORAL | 3 refills | Status: AC

## 2023-09-21 MED ORDER — SUCRALFATE 1 G PO TABS: 1.0000 g | ORAL_TABLET | Freq: Three times a day (TID) | ORAL | 1 refills | Status: AC

## 2023-09-21 NOTE — Patient Instructions (Signed)
 Please call the [facility] [phone]) to schedule a routine screening bone density test.

## 2023-09-21 NOTE — Progress Notes (Signed)
 Established patient visit   Patient: Janice Donovan   DOB: 08/14/1965   58 y.o. Female  MRN: [id number] Visit Date: 09/21/2023  Today's healthcare provider: LAURAINE LOISE BUOY, DO   Chief Complaint  Patient presents with   Medical Management of Chronic Issues    Patient presents fro chronic care follow up, reports feeling well.    Subjective    HPI Last annual exam: 02/09/2023  Janice Donovan is a 58 year old female with coronary artery disease who presents for a follow-up visit.  She has a history of coronary artery disease and is currently on atorvastatin  40 mg, which she started at the beginning of the year. She typically has her cholesterol levels checked every six months. She has experienced some numbness in her leg, which somewhat improved with physical therapy.  She has been on pantoprazole  (Protonix ) twice daily for acid reflux. She previously used over-the-counter Prilosec but switched to prescription due to cost. She is a 'big coffee drinker' but otherwise tries to avoid acidic foods.  She takes Senokot twice daily due to a history of a 'curved colon'.  She uses Flonase  and an albuterol  inhaler, although she seldom needs the inhaler. She takes Singulair  daily and has a stockpile at home, which has caused her to not feel it recently. Allegra  is taken daily, and vitamin D  is used daily except typically not during the summer months.  She has a history of low vitamin D  levels and takes supplements. No thyroid issues, although she occasionally experiences palpitations, thought to be due to caffeine intake when she brought it up with her cardiologist. No history of smoking or alcohol use.  She is unsure about her hepatitis B vaccination status. She inquires about bone density testing due to a family history of osteoporosis and her own back pain, which includes 'a lot of crackling.'      Medications: Outpatient Medications Prior to Visit  Medication Sig   albuterol   (VENTOLIN  HFA) 108 (90 Base) MCG/ACT inhaler Inhale 2 puffs into the lungs every 6 (six) hours as needed for wheezing or shortness of breath.   Aspirin 81 MG CAPS Take 81 mg by mouth daily.   B-COMPLEX-C PO Take by mouth.   Cholecalciferol (VITAMIN D3) 250 MCG (10000 UT) TABS Take 10,000 Units by mouth at bedtime.   fexofenadine  (ALLEGRA ) 180 MG tablet Take 1 tablet (180 mg total) by mouth every evening.   fluticasone  (FLONASE ) 50 MCG/ACT nasal spray Place 2 sprays into both nostrils daily as needed for allergies.   Magnesium 250 MG TABS    montelukast  (SINGULAIR ) 10 MG tablet Take 1 tablet (10 mg total) by mouth at bedtime.   Multiple Vitamin (MULTIVITAMIN WITH MINERALS) TABS tablet Take 1 tablet by mouth at bedtime.   senna-docusate (SENOKOT-S) 8.6-50 MG per tablet Take 2 tablets by mouth 2 (two) times daily.    [DISCONTINUED] atorvastatin  (LIPITOR) 40 MG tablet Take 1 tablet (40 mg total) by mouth daily.   [DISCONTINUED] buPROPion  (WELLBUTRIN  XL) 150 MG 24 hr tablet Take 1 tablet (150 mg total) by mouth daily.   [DISCONTINUED] pantoprazole  (PROTONIX ) 40 MG tablet Take 1 tablet (40 mg total) by mouth 2 (two) times daily.   No facility-administered medications prior to visit.    Review of Systems  Constitutional:  Negative for appetite change, chills, fatigue and fever.  Respiratory:  Negative for chest tightness and shortness of breath.   Cardiovascular:  Negative for chest pain and palpitations.  Gastrointestinal:  Negative for abdominal pain, nausea and vomiting.  Neurological:  Positive for numbness (right leg, anteromedial). Negative for dizziness and weakness.        Objective    BP 119/75 (BP Location: Right Arm, Patient Position: Sitting, Cuff Size: Normal)   Pulse (!) 55   Temp 97.7 F (36.5 C) (Oral)   Ht 5' 6 (1.676 m)   Wt 129 lb 1.6 oz (58.6 kg)   LMP 01/14/2020 (Exact Date)   SpO2 100%   BMI 20.84 kg/m     Physical Exam Constitutional:      Appearance:  Normal appearance.  HENT:     Head: Normocephalic and atraumatic.  Eyes:     General: No scleral icterus.    Extraocular Movements: Extraocular movements intact.     Conjunctiva/sclera: Conjunctivae normal.  Cardiovascular:     Rate and Rhythm: Normal rate and regular rhythm.     Pulses: Normal pulses.     Heart sounds: Normal heart sounds.  Pulmonary:     Effort: Pulmonary effort is normal. No respiratory distress.     Breath sounds: Normal breath sounds.  Abdominal:     General: Bowel sounds are normal. There is no distension.     Palpations: Abdomen is soft. There is no mass.     Tenderness: There is no abdominal tenderness. There is no guarding.  Musculoskeletal:     Right lower leg: No edema.     Left lower leg: No edema.  Skin:    General: Skin is warm and dry.  Neurological:     Mental Status: She is alert and oriented to person, place, and time. Mental status is at baseline.  Psychiatric:        Mood and Affect: Mood normal.        Behavior: Behavior normal.      No results found for any visits on 09/21/23.  Assessment & Plan    Elevated LDL cholesterol level -     Atorvastatin  Calcium ; Take 1 tablet (40 mg total) by mouth daily.  Dispense: 90 tablet; Refill: 3 -     Lipid panel  Vitamin D  deficiency -     VITAMIN D  25 Hydroxy (Vit-D Deficiency, Fractures) -     DG Bone Density; Future  Mild intermittent asthma without complication  Elevated lipoprotein(a) -     Atorvastatin  Calcium ; Take 1 tablet (40 mg total) by mouth daily.  Dispense: 90 tablet; Refill: 3  Depression, recurrent (HCC) -     buPROPion  HCl ER (XL); Take 1 tablet (150 mg total) by mouth daily.  Dispense: 90 tablet; Refill: 3  Gastroesophageal reflux disease, unspecified whether esophagitis present -     Pantoprazole  Sodium; Take 1 tablet (40 mg total) by mouth daily.  Dispense: 90 tablet; Refill: 3 -     Sucralfate ; Take 1 tablet (1 g total) by mouth 4 (four) times daily -  with meals and at  bedtime.  Dispense: 120 tablet; Refill: 1 -     Vitamin B12  High risk medication use -     Vitamin B12  Elevated hemoglobin A1c -     Hemoglobin A1c  Coronary artery disease involving native coronary artery of native heart without angina pectoris -     Comprehensive metabolic panel with GFR  Hepatitis B vaccination status unknown -     Hepatitis B surface antibody,qualitative  Back pain with risk for osteoporosis -     DG Bone Density; Future  Encounter for  screening mammogram for breast cancer -     3D Screening Mammogram, Left and Right; Future      Gastroesophageal Reflux Disease (GERD) Long-standing GERD managed with pantoprazole  40 mg twice daily, efficacy inconsistent. Discussed potential switch to omeprazole if symptoms persist. Advised dietary modifications. Long-term pantoprazole  use necessitates checking vitamin B12 levels. - Reduce pantoprazole  to once daily and monitor symptoms. - Consider switching to omeprazole at a future visit. - Advise dietary modifications: avoid eating close to bedtime and acidic foods and beverages.  Do not lie down within 2 hours of eating. - Check vitamin B12 levels due to long-term pantoprazole  use. - Start sucralfate  4 times daily for the next 1 to 2 months, then reduce to twice daily and reassess.  Coronary Artery Disease; elevated LDL level; elevated lipoprotein a Coronary artery disease managed with atorvastatin  40 mg. No current symptoms. Cardiologist monitoring condition. - Continue atorvastatin  40 mg. - Order cholesterol panel to monitor lipid levels. - Follows with cardiology; defer to specialist management  Mild intermittent asthma Asthma well-controlled with infrequent use of albuterol  inhaler. - Continue albuterol  inhaler as needed.  Allergic Rhinitis Flonase  and Allegra  are listed as current medications. - Continue Flonase  and Allegra  as needed.  General Health Maintenance Due for health maintenance screenings and  vaccinations. Uncertain hepatitis B vaccination status. Postmenopausal with family history of osteoporosis, considering bone density scan. Discussed insurance check prior to scan. - Order hepatitis B surface antibody test to determine vaccination status. - Plan for flu vaccination in late September or early October. - Discuss hepatitis B vaccination if not immune. - Order bone density scan considering osteoporosis risk factors; advise insurance check prior to scan. - Order mammogram for December. - Schedule follow-up in six months.     Return in about 6 months (around 03/23/2024) for CPE.      I discussed the assessment and treatment plan with the patient  The patient was provided an opportunity to ask questions and all were answered. The patient agreed with the plan and demonstrated an understanding of the instructions.   The patient was advised to call back or seek an in-person evaluation if the symptoms worsen or if the condition fails to improve as anticipated.    LAURAINE LOISE BUOY, DO  [facility] [phone] (phone) [phone] (fax)  [facility]

## 2023-09-22 LAB — LIPID PANEL
Chol/HDL Ratio: 1.7 ratio (ref 0.0–4.4)
Cholesterol, Total: 180 mg/dL (ref 100–199)
HDL: 106 mg/dL (ref >39)
LDL Chol Calc (NIH): 61 mg/dL (ref 0–99)
Triglycerides: 70 mg/dL (ref 0–149)
VLDL Cholesterol Cal: 13 mg/dL (ref 5–40)

## 2023-09-22 LAB — COMPREHENSIVE METABOLIC PANEL WITH GFR
ALT: 25 IU/L (ref 0–32)
AST: 30 IU/L (ref 0–40)
Albumin: 4.8 g/dL (ref 3.8–4.9)
Alkaline Phosphatase: 98 IU/L (ref 44–121)
BUN/Creatinine Ratio: 28 — ABNORMAL HIGH (ref 9–23)
BUN: 19 mg/dL (ref 6–24)
Bilirubin Total: 0.4 mg/dL (ref 0.0–1.2)
CO2: 22 mmol/L (ref 20–29)
Calcium: 9.8 mg/dL (ref 8.7–10.2)
Chloride: 102 mmol/L (ref 96–106)
Creatinine, Ser: 0.69 mg/dL (ref 0.57–1.00)
Globulin, Total: 2 g/dL (ref 1.5–4.5)
Glucose: 89 mg/dL (ref 70–99)
Potassium: 4.6 mmol/L (ref 3.5–5.2)
Sodium: 141 mmol/L (ref 134–144)
Total Protein: 6.8 g/dL (ref 6.0–8.5)
eGFR: 101 mL/min/1.73 (ref >59)

## 2023-09-22 LAB — VITAMIN B12: Vitamin B-12: 1111 pg/mL (ref 232–1245)

## 2023-09-22 LAB — HEMOGLOBIN A1C
Est. average glucose Bld gHb Est-mCnc: 103 mg/dL
Hgb A1c MFr Bld: 5.2 % (ref 4.8–5.6)

## 2023-09-22 LAB — HEPATITIS B SURFACE ANTIBODY,QUALITATIVE: Hep B Surface Ab, Qual: NONREACTIVE

## 2023-09-22 LAB — VITAMIN D 25 HYDROXY (VIT D DEFICIENCY, FRACTURES): Vit D, 25-Hydroxy: 40.4 ng/mL (ref 30.0–100.0)

## 2023-09-27 ENCOUNTER — Ambulatory Visit: Payer: Self-pay | Admitting: Family Medicine

## 2023-10-21 ENCOUNTER — Encounter: Payer: Self-pay | Admitting: Family Medicine

## 2023-10-21 NOTE — Telephone Encounter (Signed)
 Please see the pt question below and advise

## 2023-10-26 ENCOUNTER — Other Ambulatory Visit: Payer: Self-pay | Admitting: Family Medicine

## 2023-10-26 DIAGNOSIS — K219 Gastro-esophageal reflux disease without esophagitis: Secondary | ICD-10-CM

## 2023-11-17 ENCOUNTER — Ambulatory Visit
Admission: RE | Admit: 2023-11-17 | Discharge: 2023-11-17 | Disposition: A | Discharge status: Home / Self Care | Source: Ambulatory Visit | Attending: Family Medicine | Admitting: Family Medicine

## 2023-11-17 DIAGNOSIS — E559 Vitamin D deficiency, unspecified: Secondary | ICD-10-CM | POA: Diagnosis present

## 2023-11-17 DIAGNOSIS — Z9189 Other specified personal risk factors, not elsewhere classified: Secondary | ICD-10-CM | POA: Diagnosis present

## 2023-11-17 DIAGNOSIS — M549 Dorsalgia, unspecified: Secondary | ICD-10-CM | POA: Diagnosis present

## 2023-11-26 ENCOUNTER — Encounter: Payer: Self-pay | Admitting: Family Medicine

## 2024-01-11 ENCOUNTER — Telehealth: Payer: Self-pay | Admitting: Family Medicine

## 2024-01-11 ENCOUNTER — Other Ambulatory Visit: Payer: Self-pay

## 2024-01-11 DIAGNOSIS — J4520 Mild intermittent asthma, uncomplicated: Secondary | ICD-10-CM

## 2024-01-11 MED ORDER — MONTELUKAST SODIUM 10 MG PO TABS: 10.0000 mg | ORAL_TABLET | Freq: Every day | ORAL | 3 refills | Status: AC

## 2024-01-11 NOTE — Telephone Encounter (Signed)
 [facility] faxed refill request for the following medications:  montelukast  (SINGULAIR ) 10 MG tablet    Please advise.

## 2024-01-11 NOTE — Telephone Encounter (Signed)
 Converted to rx request

## 2024-02-01 ENCOUNTER — Ambulatory Visit

## 2024-02-08 ENCOUNTER — Ambulatory Visit
Admission: RE | Admit: 2024-02-08 | Discharge: 2024-02-08 | Disposition: A | Discharge status: Home / Self Care | Source: Ambulatory Visit | Attending: Family Medicine | Admitting: Family Medicine

## 2024-02-08 DIAGNOSIS — Z1231 Encounter for screening mammogram for malignant neoplasm of breast: Secondary | ICD-10-CM | POA: Insufficient documentation

## 2024-02-11 ENCOUNTER — Encounter: Payer: 59 | Admitting: Family Medicine

## 2024-03-24 ENCOUNTER — Encounter: Admitting: Family Medicine
